# Patient Record
Sex: Female | Born: 1960 | Race: White | Hispanic: No | Marital: Married | Smoking: Former smoker
Health system: Southern US, Community
[De-identification: ages and names within clinical notes are randomized; demographics above are authoritative.]

## PROBLEM LIST (undated history)

## (undated) DIAGNOSIS — F329 Major depressive disorder, single episode, unspecified: Secondary | ICD-10-CM

## (undated) DIAGNOSIS — Z9889 Other specified postprocedural states: Secondary | ICD-10-CM

## (undated) DIAGNOSIS — F419 Anxiety disorder, unspecified: Secondary | ICD-10-CM

## (undated) DIAGNOSIS — K5792 Diverticulitis of intestine, part unspecified, without perforation or abscess without bleeding: Secondary | ICD-10-CM

## (undated) DIAGNOSIS — F32A Depression, unspecified: Secondary | ICD-10-CM

## (undated) DIAGNOSIS — G4730 Sleep apnea, unspecified: Secondary | ICD-10-CM

## (undated) DIAGNOSIS — J45909 Unspecified asthma, uncomplicated: Secondary | ICD-10-CM

## (undated) DIAGNOSIS — G43909 Migraine, unspecified, not intractable, without status migrainosus: Secondary | ICD-10-CM

## (undated) DIAGNOSIS — K769 Liver disease, unspecified: Secondary | ICD-10-CM

## (undated) DIAGNOSIS — R002 Palpitations: Secondary | ICD-10-CM

## (undated) DIAGNOSIS — T4145XA Adverse effect of unspecified anesthetic, initial encounter: Secondary | ICD-10-CM

## (undated) DIAGNOSIS — M1990 Unspecified osteoarthritis, unspecified site: Secondary | ICD-10-CM

## (undated) DIAGNOSIS — R112 Nausea with vomiting, unspecified: Secondary | ICD-10-CM

## (undated) DIAGNOSIS — T8859XA Other complications of anesthesia, initial encounter: Secondary | ICD-10-CM

## (undated) HISTORY — DX: Liver disease, unspecified: K76.9

## (undated) HISTORY — PX: TONSILLECTOMY: SUR1361

## (undated) HISTORY — DX: Depression, unspecified: F32.A

## (undated) HISTORY — PX: CHOLECYSTECTOMY: SHX55

## (undated) HISTORY — PX: ABDOMINAL HYSTERECTOMY: SHX81

## (undated) HISTORY — PX: SINUSOTOMY: SHX291

## (undated) HISTORY — DX: Diverticulitis of intestine, part unspecified, without perforation or abscess without bleeding: K57.92

## (undated) HISTORY — DX: Major depressive disorder, single episode, unspecified: F32.9

## (undated) HISTORY — DX: Anxiety disorder, unspecified: F41.9

## (undated) HISTORY — PX: APPENDECTOMY: SHX54

## (undated) HISTORY — DX: Unspecified asthma, uncomplicated: J45.909

---
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
## 2004-01-21 ENCOUNTER — Ambulatory Visit: Payer: Self-pay | Admitting: Unknown Physician Specialty

## 2004-05-27 ENCOUNTER — Inpatient Hospital Stay: Payer: Self-pay | Admitting: Surgery

## 2005-09-05 ENCOUNTER — Ambulatory Visit: Payer: Self-pay | Admitting: Internal Medicine

## 2005-10-11 ENCOUNTER — Ambulatory Visit: Payer: Self-pay | Admitting: Internal Medicine

## 2005-10-12 ENCOUNTER — Ambulatory Visit: Payer: Self-pay | Admitting: Internal Medicine

## 2006-11-26 ENCOUNTER — Ambulatory Visit: Payer: Self-pay | Admitting: Unknown Physician Specialty

## 2007-12-20 ENCOUNTER — Ambulatory Visit: Payer: Self-pay | Admitting: Orthopedic Surgery

## 2008-02-06 ENCOUNTER — Ambulatory Visit: Payer: Self-pay | Admitting: Unknown Physician Specialty

## 2009-01-30 ENCOUNTER — Ambulatory Visit: Payer: Self-pay | Admitting: Rheumatology

## 2009-03-04 ENCOUNTER — Inpatient Hospital Stay: Payer: Self-pay | Admitting: Internal Medicine

## 2009-04-07 ENCOUNTER — Ambulatory Visit: Payer: Self-pay | Admitting: Internal Medicine

## 2009-04-26 ENCOUNTER — Ambulatory Visit: Payer: Self-pay | Admitting: Orthopedic Surgery

## 2009-04-29 ENCOUNTER — Ambulatory Visit: Payer: Self-pay | Admitting: Orthopedic Surgery

## 2011-04-12 DIAGNOSIS — J45909 Unspecified asthma, uncomplicated: Secondary | ICD-10-CM | POA: Insufficient documentation

## 2011-04-12 DIAGNOSIS — G4730 Sleep apnea, unspecified: Secondary | ICD-10-CM | POA: Insufficient documentation

## 2011-04-12 DIAGNOSIS — R002 Palpitations: Secondary | ICD-10-CM | POA: Insufficient documentation

## 2011-04-12 DIAGNOSIS — R079 Chest pain, unspecified: Secondary | ICD-10-CM | POA: Insufficient documentation

## 2011-06-05 ENCOUNTER — Ambulatory Visit: Payer: Self-pay | Admitting: Internal Medicine

## 2011-12-08 ENCOUNTER — Ambulatory Visit: Payer: Self-pay | Admitting: Internal Medicine

## 2012-02-21 ENCOUNTER — Other Ambulatory Visit: Payer: Self-pay | Admitting: Internal Medicine

## 2012-02-27 LAB — CULTURE, BLOOD (SINGLE)

## 2012-04-23 ENCOUNTER — Ambulatory Visit: Payer: Self-pay

## 2012-08-05 ENCOUNTER — Inpatient Hospital Stay: Payer: Self-pay | Admitting: Internal Medicine

## 2012-08-05 LAB — URINALYSIS, COMPLETE
Bilirubin,UR: NEGATIVE
Blood: NEGATIVE
Glucose,UR: NEGATIVE mg/dL (ref 0–75)
Leukocyte Esterase: NEGATIVE
Ph: 7 (ref 4.5–8.0)
Protein: NEGATIVE
Specific Gravity: 1.015 (ref 1.003–1.030)
Squamous Epithelial: <1
WBC UR: 1 /HPF (ref 0–5)

## 2012-08-05 LAB — CBC WITH DIFFERENTIAL/PLATELET
Eosinophil #: 0.3 10*3/uL (ref 0.0–0.7)
Eosinophil %: 2 %
HCT: 43.5 % (ref 35.0–47.0)
Lymphocyte %: 18.4 %
MCH: 28.9 pg (ref 26.0–34.0)
MCHC: 32.9 g/dL (ref 32.0–36.0)
MCV: 88 fL (ref 80–100)
Monocyte %: 7.5 %
Neutrophil %: 71.5 %
RBC: 4.95 10*6/uL (ref 3.80–5.20)
RDW: 14.6 % — ABNORMAL HIGH (ref 11.5–14.5)
WBC: 13.6 10*3/uL — ABNORMAL HIGH (ref 3.6–11.0)

## 2012-08-05 LAB — COMPREHENSIVE METABOLIC PANEL
Albumin: 3.5 g/dL (ref 3.4–5.0)
Alkaline Phosphatase: 148 U/L — ABNORMAL HIGH (ref 50–136)
Anion Gap: 3 — ABNORMAL LOW (ref 7–16)
Bilirubin,Total: 0.4 mg/dL (ref 0.2–1.0)
Calcium, Total: 8.7 mg/dL (ref 8.5–10.1)
Chloride: 106 mmol/L (ref 98–107)
Creatinine: 1.09 mg/dL (ref 0.60–1.30)
EGFR (African American): >60
EGFR (Non-African Amer.): 58 — ABNORMAL LOW
Osmolality: 271 (ref 275–301)
Potassium: 4.1 mmol/L (ref 3.5–5.1)
Total Protein: 7.5 g/dL (ref 6.4–8.2)

## 2012-08-06 LAB — BASIC METABOLIC PANEL
Anion Gap: 3 — ABNORMAL LOW (ref 7–16)
BUN: 8 mg/dL (ref 7–18)
Co2: 27 mmol/L (ref 21–32)
Osmolality: 273 (ref 275–301)
Potassium: 3.6 mmol/L (ref 3.5–5.1)
Sodium: 137 mmol/L (ref 136–145)

## 2012-08-06 LAB — CBC WITH DIFFERENTIAL/PLATELET
Basophil #: 0.1 10*3/uL (ref 0.0–0.1)
Basophil %: 0.6 %
Eosinophil %: 2.7 %
HGB: 13.2 g/dL (ref 12.0–16.0)
MCH: 29.4 pg (ref 26.0–34.0)
MCV: 87 fL (ref 80–100)
Monocyte %: 9.3 %
Neutrophil #: 6.9 10*3/uL — ABNORMAL HIGH (ref 1.4–6.5)
Neutrophil %: 63.9 %
RBC: 4.48 10*6/uL (ref 3.80–5.20)
WBC: 10.8 10*3/uL (ref 3.6–11.0)

## 2012-08-07 LAB — CBC WITH DIFFERENTIAL/PLATELET
Basophil #: 0.1 10*3/uL (ref 0.0–0.1)
Basophil %: 0.7 %
Eosinophil #: 0.4 10*3/uL (ref 0.0–0.7)
Eosinophil %: 5 %
HGB: 13 g/dL (ref 12.0–16.0)
MCH: 28.8 pg (ref 26.0–34.0)
MCHC: 33.1 g/dL (ref 32.0–36.0)
MCV: 87 fL (ref 80–100)
Monocyte #: 0.6 x10 3/mm (ref 0.2–0.9)
Monocyte %: 8.4 %
Neutrophil %: 60.3 %
RBC: 4.51 10*6/uL (ref 3.80–5.20)
RDW: 14 % (ref 11.5–14.5)
WBC: 7.5 10*3/uL (ref 3.6–11.0)

## 2012-08-07 LAB — BASIC METABOLIC PANEL
Anion Gap: 5 — ABNORMAL LOW (ref 7–16)
BUN: 7 mg/dL (ref 7–18)
Calcium, Total: 8.5 mg/dL (ref 8.5–10.1)
Co2: 26 mmol/L (ref 21–32)
Glucose: 123 mg/dL — ABNORMAL HIGH (ref 65–99)
Sodium: 139 mmol/L (ref 136–145)

## 2012-08-08 LAB — CBC WITH DIFFERENTIAL/PLATELET
Basophil #: 0 10*3/uL (ref 0.0–0.1)
Basophil %: 0.7 %
Eosinophil #: 0.4 10*3/uL (ref 0.0–0.7)
HCT: 38.1 % (ref 35.0–47.0)
HGB: 12.8 g/dL (ref 12.0–16.0)
Lymphocyte %: 30.6 %
Monocyte #: 0.6 x10 3/mm (ref 0.2–0.9)
Neutrophil %: 53.9 %
Platelet: 214 10*3/uL (ref 150–440)
RBC: 4.35 10*6/uL (ref 3.80–5.20)
RDW: 14.1 % (ref 11.5–14.5)
WBC: 7.1 10*3/uL (ref 3.6–11.0)

## 2013-03-18 LAB — CBC WITH DIFFERENTIAL/PLATELET
BASOS PCT: 0.3 %
Basophil #: 0 10*3/uL (ref 0.0–0.1)
EOS ABS: 0.1 10*3/uL (ref 0.0–0.7)
Eosinophil %: 1.2 %
HCT: 41.8 % (ref 35.0–47.0)
HGB: 13.9 g/dL (ref 12.0–16.0)
LYMPHS ABS: 0.5 10*3/uL — AB (ref 1.0–3.6)
Lymphocyte %: 5.8 %
MCH: 29.5 pg (ref 26.0–34.0)
MCHC: 33.3 g/dL (ref 32.0–36.0)
MCV: 89 fL (ref 80–100)
Monocyte #: 0.4 x10 3/mm (ref 0.2–0.9)
Monocyte %: 4.2 %
Neutrophil #: 7.4 10*3/uL — ABNORMAL HIGH (ref 1.4–6.5)
Neutrophil %: 88.5 %
Platelet: 185 10*3/uL (ref 150–440)
RBC: 4.71 10*6/uL (ref 3.80–5.20)
RDW: 14.1 % (ref 11.5–14.5)
WBC: 8.4 10*3/uL (ref 3.6–11.0)

## 2013-03-18 LAB — COMPREHENSIVE METABOLIC PANEL
Albumin: 3.8 g/dL (ref 3.4–5.0)
Alkaline Phosphatase: 127 U/L — ABNORMAL HIGH
Anion Gap: 5 — ABNORMAL LOW (ref 7–16)
BUN: 7 mg/dL (ref 7–18)
Bilirubin,Total: 0.5 mg/dL (ref 0.2–1.0)
CHLORIDE: 106 mmol/L (ref 98–107)
Calcium, Total: 8.3 mg/dL — ABNORMAL LOW (ref 8.5–10.1)
Co2: 27 mmol/L (ref 21–32)
Creatinine: 1.26 mg/dL (ref 0.60–1.30)
EGFR (African American): 56 — ABNORMAL LOW
GFR CALC NON AF AMER: 49 — AB
Glucose: 119 mg/dL — ABNORMAL HIGH (ref 65–99)
Osmolality: 275 (ref 275–301)
Potassium: 3.9 mmol/L (ref 3.5–5.1)
SGOT(AST): 23 U/L (ref 15–37)
SGPT (ALT): 24 U/L (ref 12–78)
SODIUM: 138 mmol/L (ref 136–145)
TOTAL PROTEIN: 7.7 g/dL (ref 6.4–8.2)

## 2013-03-18 LAB — URINALYSIS, COMPLETE
Bacteria: NONE SEEN
Bilirubin,UR: NEGATIVE
Blood: NEGATIVE
Glucose,UR: NEGATIVE mg/dL (ref 0–75)
Ketone: NEGATIVE
Nitrite: NEGATIVE
PROTEIN: NEGATIVE
Ph: 7 (ref 4.5–8.0)
RBC,UR: 4 /HPF (ref 0–5)
Specific Gravity: 1.017 (ref 1.003–1.030)
Squamous Epithelial: 3

## 2013-03-18 LAB — CK TOTAL AND CKMB (NOT AT ARMC)
CK, TOTAL: 137 U/L
CK, Total: 115 U/L
CK, Total: 115 U/L
CK-MB: 0.5 ng/mL (ref 0.5–3.6)
CK-MB: 0.8 ng/mL (ref 0.5–3.6)
CK-MB: 0.8 ng/mL (ref 0.5–3.6)

## 2013-03-18 LAB — TROPONIN I: Troponin-I: <0.02 ng/mL

## 2013-03-19 LAB — CBC WITH DIFFERENTIAL/PLATELET
BASOS PCT: 0.2 %
Basophil #: 0 10*3/uL (ref 0.0–0.1)
Eosinophil #: 0 10*3/uL (ref 0.0–0.7)
Eosinophil %: 0.1 %
HCT: 38.2 % (ref 35.0–47.0)
HGB: 12.8 g/dL (ref 12.0–16.0)
LYMPHS PCT: 9.7 %
Lymphocyte #: 0.7 10*3/uL — ABNORMAL LOW (ref 1.0–3.6)
MCH: 29.7 pg (ref 26.0–34.0)
MCHC: 33.6 g/dL (ref 32.0–36.0)
MCV: 89 fL (ref 80–100)
MONO ABS: 0.3 x10 3/mm (ref 0.2–0.9)
Monocyte %: 4.6 %
Neutrophil #: 6.2 10*3/uL (ref 1.4–6.5)
Neutrophil %: 85.4 %
Platelet: 148 10*3/uL — ABNORMAL LOW (ref 150–440)
RBC: 4.31 10*6/uL (ref 3.80–5.20)
RDW: 14.2 % (ref 11.5–14.5)
WBC: 7.2 10*3/uL (ref 3.6–11.0)

## 2013-03-19 LAB — BASIC METABOLIC PANEL
Anion Gap: 7 (ref 7–16)
BUN: 13 mg/dL (ref 7–18)
CO2: 22 mmol/L (ref 21–32)
Calcium, Total: 7.7 mg/dL — ABNORMAL LOW (ref 8.5–10.1)
Chloride: 106 mmol/L (ref 98–107)
Creatinine: 1.42 mg/dL — ABNORMAL HIGH (ref 0.60–1.30)
EGFR (African American): 49 — ABNORMAL LOW
EGFR (Non-African Amer.): 42 — ABNORMAL LOW
Glucose: 116 mg/dL — ABNORMAL HIGH (ref 65–99)
Osmolality: 271 (ref 275–301)
Potassium: 3.5 mmol/L (ref 3.5–5.1)
Sodium: 135 mmol/L — ABNORMAL LOW (ref 136–145)

## 2013-03-20 ENCOUNTER — Inpatient Hospital Stay: Payer: Self-pay | Admitting: Internal Medicine

## 2013-03-20 LAB — BASIC METABOLIC PANEL
ANION GAP: 6 — AB (ref 7–16)
BUN: 14 mg/dL (ref 7–18)
CALCIUM: 7.5 mg/dL — AB (ref 8.5–10.1)
CHLORIDE: 107 mmol/L (ref 98–107)
CO2: 22 mmol/L (ref 21–32)
Creatinine: 1.29 mg/dL (ref 0.60–1.30)
EGFR (African American): 55 — ABNORMAL LOW
EGFR (Non-African Amer.): 47 — ABNORMAL LOW
GLUCOSE: 101 mg/dL — AB (ref 65–99)
Osmolality: 271 (ref 275–301)
Potassium: 3.5 mmol/L (ref 3.5–5.1)
Sodium: 135 mmol/L — ABNORMAL LOW (ref 136–145)

## 2013-03-20 LAB — CBC WITH DIFFERENTIAL/PLATELET
Basophil #: 0 10*3/uL (ref 0.0–0.1)
Basophil %: 0.1 %
Eosinophil #: 0 10*3/uL (ref 0.0–0.7)
Eosinophil %: 0.3 %
HCT: 38.3 % (ref 35.0–47.0)
HGB: 12.9 g/dL (ref 12.0–16.0)
Lymphocyte #: 0.5 10*3/uL — ABNORMAL LOW (ref 1.0–3.6)
Lymphocyte %: 8.6 %
MCH: 29.8 pg (ref 26.0–34.0)
MCHC: 33.7 g/dL (ref 32.0–36.0)
MCV: 88 fL (ref 80–100)
MONOS PCT: 2.6 %
Monocyte #: 0.2 x10 3/mm (ref 0.2–0.9)
NEUTROS PCT: 88.4 %
Neutrophil #: 5.5 10*3/uL (ref 1.4–6.5)
Platelet: 119 10*3/uL — ABNORMAL LOW (ref 150–440)
RBC: 4.34 10*6/uL (ref 3.80–5.20)
RDW: 14.1 % (ref 11.5–14.5)
WBC: 6.2 10*3/uL (ref 3.6–11.0)

## 2013-03-20 LAB — URINE CULTURE

## 2013-03-21 LAB — BASIC METABOLIC PANEL
ANION GAP: 6 — AB (ref 7–16)
BUN: 12 mg/dL (ref 7–18)
CALCIUM: 8.2 mg/dL — AB (ref 8.5–10.1)
CHLORIDE: 110 mmol/L — AB (ref 98–107)
CO2: 22 mmol/L (ref 21–32)
CREATININE: 1.07 mg/dL (ref 0.60–1.30)
EGFR (Non-African Amer.): 59 — ABNORMAL LOW
Glucose: 137 mg/dL — ABNORMAL HIGH (ref 65–99)
Osmolality: 278 (ref 275–301)
Potassium: 3.6 mmol/L (ref 3.5–5.1)
Sodium: 138 mmol/L (ref 136–145)

## 2013-03-21 LAB — CBC WITH DIFFERENTIAL/PLATELET
BASOS ABS: 0 10*3/uL (ref 0.0–0.1)
BASOS PCT: 0.4 %
EOS PCT: 0.1 %
Eosinophil #: 0 10*3/uL (ref 0.0–0.7)
HCT: 37.3 % (ref 35.0–47.0)
HGB: 12.8 g/dL (ref 12.0–16.0)
Lymphocyte #: 0.4 10*3/uL — ABNORMAL LOW (ref 1.0–3.6)
Lymphocyte %: 7.7 %
MCH: 29.9 pg (ref 26.0–34.0)
MCHC: 34.2 g/dL (ref 32.0–36.0)
MCV: 87 fL (ref 80–100)
Monocyte #: 0.1 x10 3/mm — ABNORMAL LOW (ref 0.2–0.9)
Monocyte %: 2.6 %
NEUTROS ABS: 5.1 10*3/uL (ref 1.4–6.5)
Neutrophil %: 89.2 %
Platelet: 123 10*3/uL — ABNORMAL LOW (ref 150–440)
RBC: 4.27 10*6/uL (ref 3.80–5.20)
RDW: 14.3 % (ref 11.5–14.5)
WBC: 5.7 10*3/uL (ref 3.6–11.0)

## 2013-03-23 LAB — CULTURE, BLOOD (SINGLE)

## 2013-03-23 LAB — EXPECTORATED SPUTUM ASSESSMENT W REFEX TO RESP CULTURE

## 2013-05-30 DIAGNOSIS — F419 Anxiety disorder, unspecified: Secondary | ICD-10-CM | POA: Insufficient documentation

## 2013-05-30 DIAGNOSIS — F32A Depression, unspecified: Secondary | ICD-10-CM | POA: Insufficient documentation

## 2013-05-30 DIAGNOSIS — E669 Obesity, unspecified: Secondary | ICD-10-CM | POA: Insufficient documentation

## 2013-05-30 DIAGNOSIS — F329 Major depressive disorder, single episode, unspecified: Secondary | ICD-10-CM | POA: Insufficient documentation

## 2013-10-13 ENCOUNTER — Ambulatory Visit: Payer: Self-pay | Admitting: Unknown Physician Specialty

## 2013-10-16 LAB — PATHOLOGY REPORT

## 2013-12-02 ENCOUNTER — Ambulatory Visit: Payer: Self-pay | Admitting: Internal Medicine

## 2013-12-05 ENCOUNTER — Ambulatory Visit: Payer: Self-pay | Admitting: Internal Medicine

## 2013-12-23 ENCOUNTER — Ambulatory Visit: Payer: Self-pay | Admitting: Internal Medicine

## 2014-01-07 ENCOUNTER — Inpatient Hospital Stay: Payer: Self-pay | Admitting: Family Medicine

## 2014-01-07 LAB — URINALYSIS, COMPLETE
Bacteria: NONE SEEN
Bilirubin,UR: NEGATIVE
Blood: NEGATIVE
Glucose,UR: NEGATIVE mg/dL (ref 0–75)
Ketone: NEGATIVE
Leukocyte Esterase: NEGATIVE
Nitrite: NEGATIVE
Ph: 5 (ref 4.5–8.0)
Protein: NEGATIVE
RBC,UR: 3 /HPF (ref 0–5)
Specific Gravity: 1.015 (ref 1.003–1.030)
WBC UR: 2 /HPF (ref 0–5)

## 2014-01-07 LAB — CBC WITH DIFFERENTIAL/PLATELET
BASOS PCT: 0.7 %
Basophil #: 0.1 10*3/uL (ref 0.0–0.1)
EOS PCT: 0.6 %
Eosinophil #: 0.1 10*3/uL (ref 0.0–0.7)
HCT: 46.5 % (ref 35.0–47.0)
HGB: 15.2 g/dL (ref 12.0–16.0)
LYMPHS PCT: 6.2 %
Lymphocyte #: 0.8 10*3/uL — ABNORMAL LOW (ref 1.0–3.6)
MCH: 29.4 pg (ref 26.0–34.0)
MCHC: 32.6 g/dL (ref 32.0–36.0)
MCV: 90 fL (ref 80–100)
MONO ABS: 0.4 x10 3/mm (ref 0.2–0.9)
Monocyte %: 2.7 %
NEUTROS ABS: 12.2 10*3/uL — AB (ref 1.4–6.5)
Neutrophil %: 89.8 %
Platelet: 266 10*3/uL (ref 150–440)
RBC: 5.17 10*6/uL (ref 3.80–5.20)
RDW: 14.2 % (ref 11.5–14.5)
WBC: 13.6 10*3/uL — AB (ref 3.6–11.0)

## 2014-01-07 LAB — COMPREHENSIVE METABOLIC PANEL
ALK PHOS: 141 U/L — AB
ALT: 32 U/L
Albumin: 3.9 g/dL (ref 3.4–5.0)
Anion Gap: 7 (ref 7–16)
BUN: 16 mg/dL (ref 7–18)
Bilirubin,Total: 0.6 mg/dL (ref 0.2–1.0)
CALCIUM: 8.5 mg/dL (ref 8.5–10.1)
Chloride: 112 mmol/L — ABNORMAL HIGH (ref 98–107)
Co2: 19 mmol/L — ABNORMAL LOW (ref 21–32)
Creatinine: 1.05 mg/dL (ref 0.60–1.30)
GFR CALC NON AF AMER: 58 — AB
Glucose: 122 mg/dL — ABNORMAL HIGH (ref 65–99)
OSMOLALITY: 278 (ref 275–301)
Potassium: 3.7 mmol/L (ref 3.5–5.1)
SGOT(AST): 24 U/L (ref 15–37)
SODIUM: 138 mmol/L (ref 136–145)
TOTAL PROTEIN: 7.9 g/dL (ref 6.4–8.2)

## 2014-01-08 LAB — BASIC METABOLIC PANEL
ANION GAP: 10 (ref 7–16)
BUN: 14 mg/dL (ref 7–18)
CALCIUM: 7.6 mg/dL — AB (ref 8.5–10.1)
CHLORIDE: 109 mmol/L — AB (ref 98–107)
Co2: 23 mmol/L (ref 21–32)
Creatinine: 1.05 mg/dL (ref 0.60–1.30)
GFR CALC NON AF AMER: 58 — AB
Glucose: 116 mg/dL — ABNORMAL HIGH (ref 65–99)
OSMOLALITY: 285 (ref 275–301)
POTASSIUM: 3.4 mmol/L — AB (ref 3.5–5.1)
Sodium: 142 mmol/L (ref 136–145)

## 2014-01-08 LAB — URINALYSIS, COMPLETE
BLOOD: NEGATIVE
Bilirubin,UR: NEGATIVE
GLUCOSE, UR: NEGATIVE mg/dL (ref 0–75)
Ketone: NEGATIVE
NITRITE: NEGATIVE
PROTEIN: NEGATIVE
Ph: 6 (ref 4.5–8.0)
RBC,UR: 1 /HPF (ref 0–5)
Specific Gravity: 1.014 (ref 1.003–1.030)
WBC UR: 4 /HPF (ref 0–5)

## 2014-01-08 LAB — CBC WITH DIFFERENTIAL/PLATELET
Basophil #: 0 10*3/uL (ref 0.0–0.1)
Basophil %: 0.4 %
EOS PCT: 1.8 %
Eosinophil #: 0.1 10*3/uL (ref 0.0–0.7)
HCT: 40.1 % (ref 35.0–47.0)
HGB: 12.9 g/dL (ref 12.0–16.0)
Lymphocyte #: 1 10*3/uL (ref 1.0–3.6)
Lymphocyte %: 14.1 %
MCH: 29.3 pg (ref 26.0–34.0)
MCHC: 32.1 g/dL (ref 32.0–36.0)
MCV: 92 fL (ref 80–100)
MONOS PCT: 6.9 %
Monocyte #: 0.5 x10 3/mm (ref 0.2–0.9)
Neutrophil #: 5.5 10*3/uL (ref 1.4–6.5)
Neutrophil %: 76.8 %
Platelet: 185 10*3/uL (ref 150–440)
RBC: 4.38 10*6/uL (ref 3.80–5.20)
RDW: 14.1 % (ref 11.5–14.5)
WBC: 7.2 10*3/uL (ref 3.6–11.0)

## 2014-01-09 LAB — BASIC METABOLIC PANEL
ANION GAP: 4 — AB (ref 7–16)
BUN: 9 mg/dL (ref 7–18)
CALCIUM: 7.7 mg/dL — AB (ref 8.5–10.1)
CO2: 28 mmol/L (ref 21–32)
Chloride: 109 mmol/L — ABNORMAL HIGH (ref 98–107)
Creatinine: 1.3 mg/dL (ref 0.60–1.30)
EGFR (African American): 55 — ABNORMAL LOW
EGFR (Non-African Amer.): 46 — ABNORMAL LOW
GLUCOSE: 99 mg/dL (ref 65–99)
OSMOLALITY: 280 (ref 275–301)
Potassium: 3.8 mmol/L (ref 3.5–5.1)
Sodium: 141 mmol/L (ref 136–145)

## 2014-01-09 LAB — CBC WITH DIFFERENTIAL/PLATELET
BASOS ABS: 0 10*3/uL (ref 0.0–0.1)
Basophil %: 0.5 %
Eosinophil #: 0.3 10*3/uL (ref 0.0–0.7)
Eosinophil %: 5.7 %
HCT: 38.2 % (ref 35.0–47.0)
HGB: 12.4 g/dL (ref 12.0–16.0)
LYMPHS PCT: 17.4 %
Lymphocyte #: 0.9 10*3/uL — ABNORMAL LOW (ref 1.0–3.6)
MCH: 29.6 pg (ref 26.0–34.0)
MCHC: 32.4 g/dL (ref 32.0–36.0)
MCV: 92 fL (ref 80–100)
Monocyte #: 0.7 x10 3/mm (ref 0.2–0.9)
Monocyte %: 12.3 %
NEUTROS PCT: 64.1 %
Neutrophil #: 3.4 10*3/uL (ref 1.4–6.5)
Platelet: 161 10*3/uL (ref 150–440)
RBC: 4.17 10*6/uL (ref 3.80–5.20)
RDW: 14.2 % (ref 11.5–14.5)
WBC: 5.3 10*3/uL (ref 3.6–11.0)

## 2014-01-09 LAB — CLOSTRIDIUM DIFFICILE(ARMC)

## 2014-01-10 LAB — BASIC METABOLIC PANEL
Anion Gap: 7 (ref 7–16)
BUN: 7 mg/dL (ref 7–18)
CREATININE: 1.04 mg/dL (ref 0.60–1.30)
Calcium, Total: 7.9 mg/dL — ABNORMAL LOW (ref 8.5–10.1)
Chloride: 115 mmol/L — ABNORMAL HIGH (ref 98–107)
Co2: 22 mmol/L (ref 21–32)
EGFR (Non-African Amer.): 59 — ABNORMAL LOW
Glucose: 103 mg/dL — ABNORMAL HIGH (ref 65–99)
Osmolality: 285 (ref 275–301)
POTASSIUM: 3.6 mmol/L (ref 3.5–5.1)
SODIUM: 144 mmol/L (ref 136–145)

## 2014-01-10 LAB — CBC WITH DIFFERENTIAL/PLATELET
BASOS ABS: 0 10*3/uL (ref 0.0–0.1)
BASOS PCT: 0.8 %
EOS ABS: 0.4 10*3/uL (ref 0.0–0.7)
EOS PCT: 7.1 %
HCT: 39.3 % (ref 35.0–47.0)
HGB: 12.8 g/dL (ref 12.0–16.0)
LYMPHS ABS: 1.5 10*3/uL (ref 1.0–3.6)
LYMPHS PCT: 28.9 %
MCH: 29.7 pg (ref 26.0–34.0)
MCHC: 32.6 g/dL (ref 32.0–36.0)
MCV: 91 fL (ref 80–100)
Monocyte #: 0.6 x10 3/mm (ref 0.2–0.9)
Monocyte %: 12.3 %
Neutrophil #: 2.6 10*3/uL (ref 1.4–6.5)
Neutrophil %: 50.9 %
Platelet: 170 10*3/uL (ref 150–440)
RBC: 4.32 10*6/uL (ref 3.80–5.20)
RDW: 14.2 % (ref 11.5–14.5)
WBC: 5 10*3/uL (ref 3.6–11.0)

## 2014-01-10 LAB — URINE CULTURE

## 2014-01-10 LAB — WBCS, STOOL

## 2014-02-20 DIAGNOSIS — S83209A Unspecified tear of unspecified meniscus, current injury, unspecified knee, initial encounter: Secondary | ICD-10-CM | POA: Insufficient documentation

## 2014-02-20 DIAGNOSIS — M1710 Unilateral primary osteoarthritis, unspecified knee: Secondary | ICD-10-CM | POA: Insufficient documentation

## 2014-02-20 DIAGNOSIS — M179 Osteoarthritis of knee, unspecified: Secondary | ICD-10-CM | POA: Insufficient documentation

## 2014-04-01 ENCOUNTER — Ambulatory Visit: Payer: Self-pay | Admitting: General Practice

## 2014-04-22 ENCOUNTER — Ambulatory Visit
Admit: 2014-04-22 | Disposition: A | Discharge status: Home / Self Care | Payer: Self-pay | Attending: Internal Medicine | Admitting: Internal Medicine

## 2014-05-08 NOTE — H&P (Signed)
PATIENT NAME:  [name], [name] MR#:  [id number] DATE OF BIRTH:  08/03/1960  DATE OF ADMISSION:  08/05/2012  CHIEF COMPLAINT: Lower abdominal pain.   HISTORY OF PRESENT ILLNESS: [name] is a 54 year old female with a history of asthma, migraines and depression, who presented to the clinic complaining of pain in the lower abdomen radiating to the back. The patient states that her symptoms started approximately 3 days back and got worse for the last couple of days. She describes the pain as 9 out of 10, worse with movement, worse with walking and sometimes doubles her over. She has also been running a low-grade fever and has been complaining of diarrhea along with some nausea. Denies any use of tobacco or alcohol. Denies any chest pain. No vomiting.   PAST MEDICAL HISTORY: Significant for: 1. Allergic rhinitis.  2. Asthma.  3. Peptic ulcer disease.  4. Gastroesophageal reflux disease.  5. Endometriosis.  6. Anxiety, depression.  7. Sleep apnea.  8. Diverticulosis.  9. History of migraines.  10 History of osteoarthritis with plantar fasciitis.   PAST SURGICAL HISTORY: Significant for: 1. Abdominal hysterectomy with bilateral salpingo-oophorectomy.  2. History of knee surgery.  3. History of cholecystectomy in March 2002.  4. History of arthroscopic left knee surgery and partial medial meniscectomy.   FAMILY HISTORY: Mother has CHF and type 2 diabetes. Dad had skin cancer.   REVIEW OF SYSTEMS: The patient denies any headaches. No vomiting. No chest pain. No shortness of breath. She has been complaining of some dysuria associated with lower abdominal pain, nausea and diarrhea.   CURRENT MEDICATIONS: Include: 1. Advair Diskus 250/50 one puff b.i.d.  2. Cymbalta 160 mg once a day.  3. Singulair 10 mg a day.  4. Diazepam 2.5 mg at bedtime.  5. Zyrtec 10 mg at bedtime.  6. BuSpar 15 mg b.i.d.  7. Abilify 2 mg at night. 8. Topamax 200 mg at night. 9. One baby aspirin. 10. ProAir HFA 2  puffs q.i.d. p.r.n.   ALLERGIES: SHE IS ALLERGIC TO AUGMENTIN, CEFTIN, CORTISONE, DARVOCET, HYDROCODONE, MYCINS, PENICILLIN, RED DYE, SHRIMP, SULFA, TRIAMCINOLONE AND SINGULAIR ESPECIALLY WITH RED DYE.   PHYSICAL EXAMINATION:  GENERAL: She was in obvious pain. She was anxious, obese.  VITAL SIGNS: Weight was 285 pounds, blood pressure 124/82, pulse was 96.  HEENT: [place], AT.  HEART: S1, S2.  LUNGS: Clear to auscultation.  ABDOMEN: Obese, soft. Evidence of tenderness detected in both lower quadrant areas with guarding. No rebound.  EXTREMITIES: Trace edema.  NEUROLOGIC: Alert and oriented x3. No obvious focal signs.   LABORATORIES DONE TODAY: Showed WBC count 13.6, hemoglobin 14.2, hematocrit 43.5, platelets 225. Total protein 7.5, alkaline phosphatase 148, glucose 96, BUN 11, creatinine 1.09, sodium 136, potassium 4.1, chloride 106, CO2 27, EGFR was greater than 60.   IMPRESSION:  1. Bilateral lower quadrant abdominal pain, unclear etiology. Differential diagnosis includes diverticulitis versus pyelonephritis. Will admit the patient to [facility] for pain management. Will also obtain CT scan of the abdomen and pelvis. SINCE THE PATIENT HAS AN IODINE ALLERGY, will do it without IV contrast but with oral contrast.  2. Asthma. Continue ProAir HFA.  3. Migraine headaches. Continue Topamax.  4. Depression and anxiety. Continue BuSpar, diazepam and Abilify. Will hold on Cymbalta at this point in time.  5. Gastroesophageal reflux disease. Continue Protonix.  6. For abdominal pain, symptomatic relief with Demerol 25 mg IV q.6 p.r.n.   Discussed in detail plan of action with the patient, who was in  agreement.   ____________________________ Tracie Harrier, MD vh:OSi D: 08/05/2012 17:41:19 ET T: 08/06/2012 07:23:33 ET JOB#: [id number]  cc: Tracie Harrier, MD, <Dictator> Tracie Harrier MD ELECTRONICALLY SIGNED 08/08/2012 19:05

## 2014-05-08 NOTE — Discharge Summary (Signed)
PATIENT NAME:  [name], [name] MR#:  [id number] DATE OF BIRTH:  May 15, 1960  DATE OF ADMISSION:  08/05/2012 DATE OF DISCHARGE:  08/09/2012  DISCHARGE DIAGNOSES:  1.  Acute sigmoid diverticulitis.  2.  History of asthma.  3.  Migraine headaches.  4.  Depression and anxiety.  5.  Gastroesophageal reflux disease.   CHIEF COMPLAINT: Lower abdominal pain.   HISTORY OF PRESENT ILLNESS: [name] is a 54 year old female with a history of migraines, depression and asthma, who presented to the [facility] complaining of pain in the lower abdomen radiating to the back. The patient states that her symptoms started about 3 days prior to admission and progressively have gotten worse. She had also running a low-grade fever and had been complaining of diarrhea associated with nausea. The patient denies any recent use of antibiotics. Denies any chest pain. No vomiting.   PAST MEDICAL HISTORY: Significant for allergy colitis, asthma, peptic ulcer disease, gastroesophageal reflux disease, endometriosis, anxiety, depression, sleep apnea, diverticulosis, migraine headaches, osteoarthritis and plantar fasciitis.   PAST SURGICAL HISTORY: Significant for abdominal hysterectomy, history of knee surgery, history of cholecystectomy, arthroscopic left knee surgery and partial medial meniscectomy.   PHYSICAL EXAMINATION:  GENERAL: She was in obvious pain, anxious, obese.  VITAL SIGNS: Weight 285 pounds, blood pressure 124/82, pulse was 96.  HEENT: NCAT.  HEART: S1, S2.  LUNGS: Clear.  ABDOMEN: Soft. Evidence of tenderness noted initially in both lower quadrants. Subsequently localized left lower quadrant area with guarding. No rebound.  EXTREMITIES: Trace edema.  NEUROLOGIC: Nonfocal.   LABORATORY, DIAGNOSTIC AND RADIOLOGICAL DATA: WBC count 13.6, hemoglobin 14.2, hematocrit 43.5, platelets 125. Total protein 7.5, alkaline phosphatase 148, glucose 96, BUN 11, creatinine 1.09, sodium 136, potassium 4.1,  chloride 106, CO2 27.   HOSPITAL COURSE: The patient was admitted to [facility] and started on intravenous Cipro and Flagyl. She also underwent a CT scan of the abdomen and pelvis which was read as findings likely representing acute sigmoid diverticulitis. There was no extraluminal fluid collection. The patient responded well to the antibiotics and her white cell count came down to normal levels. Symptomatically she was also better. She was started on clear liquid diet, and this was gradually advanced to soft diet and patient was stable at the time of discharge.   DISCHARGE MEDICATIONS: She was discharged on the following medications: Cipro 5 mg p.o. b.i.d. for 7 days, metronidazole 500 mg p.o. t.i.d. for 7 days, pantoprazole 40 mg once a day, BuSpar 15 mg b.i.d., Pro-Air HFA 2 puffs 4 times a day as needed, Singulair 10 mg at bedtime, Valium 5 mg at bedtime, Nasonex nasal spray 2 sprays each nostril once a day, multivitamin 1 tablet a day, Zyrtec 10 mg a day, Topamax 200 mg once a day and Protonix 40 mg once a day. The patient was also given Diflucan 150 mg x 1 with 1 refill and advised to follow up in the clinic in 1 to 2 weeks' time.   CONDITION ON DISCHARGE: The patient is stable at the time of discharge.   TOTAL TIME SPENT IN DISCHARGING THIS PATIENT: 35 minutes.  ____________________________ Tracie Harrier, MD vh:aw D: 08/15/2012 13:12:20 ET T: 08/15/2012 13:27:01 ET JOB#: [id number]  cc: Tracie Harrier, MD, <Dictator> Tracie Harrier MD ELECTRONICALLY SIGNED 08/19/2012 17:29

## 2014-05-09 NOTE — H&P (Signed)
PATIENT NAME:  [name], [name] MR#:  [id number] DATE OF BIRTH:  August 26, 1960  DATE OF ADMISSION:  03/18/2013  CHIEF COMPLAINT:  Fever, chills, myalgia, diarrhea, weakness, dizziness, lightheadedness, vomiting, chest pain.   HISTORY OF PRESENT ILLNESS: [name] is a 54 year old female with a history of asthma, gastroesophageal reflux disease, depression, sleep apnea, GERD, presents to the [facility] complaining of feeling weak associated with dizziness and lightheadedness. She also has been running a fever and has been having chills and states that her chest hurts. She started vomiting in the clinic today and states that she feels sleepy and has difficulty in standing.   PAST MEDICAL HISTORY: Significant for asthma, migraine headaches, depression, sleep apnea, allergic rhinitis, history of endometriosis, diverticulosis, osteoarthritis, spinal fasciitis, lumbar disk disease.   PAST SURGICAL HISTORY: Significant for abdominal hysterectomy, bilateral salpingo-oophorectomy in 2000, knee surgery, cholecystectomy in March 2002, arthroscopic left knee surgery with partial medial meniscectomy.  FAMILY HISTORY: Mother had CHF and type 2 diabetes. Dad had skin cancer.   CURRENT MEDICATIONS: Topamax 200 mg at bedtime, Advair Diskus 250/50 one puff b.i.d., diazepam 5 mg at bedtime, Zyrtec 10 mg at bedtime, oxygen 2 liters along with CPAP at night, BuSpar 15 mg once a day, Nasonex nasal spray 50 mcg b.i.d., Singulair 10 mg once a day, Align 1 capsule once a day, ProAir HFA 2 puffs q.i.d. p.[name].n., furosemide 20 mg once a day. escitalopram 20 mg once a day.   ALLERGIES: AMINOGLYCOSIDES, MONTELUKAST, AUGMENTIN, CEFTIN, CORTISONE, DARVOCET, HYDROCODONE, -MYCINS, PENICILLIN,  RED DYE, SHRIMP, SULFA, TRIAMCINOLONE ACETATE AND SINGULAIR.   PHYSICAL EXAMINATION: GENERAL: She appeared to be in moderate distress retching and vomiting. VITAL SIGNS:  Weight was 286, blood pressure 154/80, temperature of 101.9. Pulse  was 104, pulse oximetry 97% on room air, obese, in wheelchair.  HEENT: NCAT. Mucous membranes were dry.  NECK: No JVD.  HEART: S1, S2.  LUNGS: Rhonchi present bilaterally.  ABDOMEN: Soft. Tenderness noted in the right lower quadrant area. Mild guarding. No rebound.  EXTREMITIES: Trace edema.  NEUROLOGIC: Nonfocal.   IMPRESSION: Fever associated with diarrhea vomiting, weakness. Will check rapid flu test. We will also admit the patient to [facility] for IV fluids, Tylenol for fever and Zofran for vomiting. We will cycle cardiac enzymes, continue her asthma medications including albuterol and as needed nebulizers. Continue Lexapro and proton pump inhibitor and also continue CPAP with oxygen. Will obtain chest. We will also obtain blood cultures and obtain CBC, metabolic panel, urinalysis and rapid flu test. I am going to hold off on antibiotics until we see an obvious focus of infection, but at this point I am not able to send the patient home because of weakness and difficulty standing up. Will also monitor the patient on telemetry because of chest pain. The patient verbalized understanding and is agreeable to current plan.    ____________________________ Tracie Harrier, MD vh:ce D: 03/18/2013 12:08:24 ET T: 03/18/2013 12:37:42 ET JOB#: [id number]  cc: Tracie Harrier, MD, <Dictator> Tracie Harrier MD ELECTRONICALLY SIGNED 04/23/2013 13:40

## 2014-05-09 NOTE — H&P (Signed)
PATIENT NAME:  [name], [name] MR#:  [id number] DATE OF BIRTH:  24-Feb-1960  DATE OF ADMISSION:  01/07/2014  PRIMARY CARE PHYSICIAN: Tracie Harrier, MD    CHIEF COMPLAINT: Nausea, vomiting and diarrhea.   HISTORY OF PRESENT ILLNESS: [name] is a 54 year old female with a history of asthma, gastroesophageal reflux disease, depression, obesity who comes to the Emergency Department with the complaints of nausea, vomiting and diarrhea started since 1 a.m. The patient states had multiple episodes of vomiting, has been having multiple episodes of diarrhea, mucousy, associated with abdominal pain in the lower part of the abdomen. The patient states experiences some subjective fevers. Concerning this, multiple episodes of nausea, vomiting and diarrhea, came to the Emergency Department. CT abdomen and pelvis is consistent with enteritis and possible colitis. The patient was given Cipro and Flagyl in the Emergency Department by mouth, which the patient vomited. Concerning this, the decision is made to admit the patient for further treatment. The patient is also found to have elevated white blood cell count of 13,000 with a left shift. The patient denies having any sick contacts. Ate some hot dog leftover, however, the patient's husband also ate the similar thing however who did not fall sick.   PAST MEDICAL HISTORY:  1.  Asthma.  2.  Migraine headaches.  3.  Depression.  4.  Sleep apnea.  5.  Allergic rhinitis.  6.  Endometriosis.  7.  Diverticulosis.  8.  Osteoarthritic.   PAST SURGICAL HISTORY:  1.  Left knee surgery.  2.  Appendectomy.  3.  Sinus surgery.   4.  Cholecystectomy.  5.  Hysterectomy.   ALLERGIES:  1.  CEFTIN.  2.  PENICILLIN.  3.  IODINE.   4.  .   5.  SULFA.   6.  LATEX.   7.  SHELLFISH.   8.  DARVOCET.   9.  SEAFOOD.     HOME MEDICATIONS:  1.  Zofran 4 mg every 8 hours as needed.   2.  Valium 2 mg, 1-2 tablets 4 times a day as needed.  3.  Topamax 200 mg once a day  at bedtime.  4.  ProAir 2 puffs 4 times a day.  5.  .  6.  Fluticasone 1 puff 2 times a day.  7.  Celexa 20 mg once a day.  8.  Demerol 50 mg every 4 hours as needed.  9.  BuSpar  mg 2 times a day.  10.  Advair Diskus 1 puff 2 times a day.   SOCIAL HISTORY: Former smoker, quit 30 years back. Denies drinking alcohol or using illicit drugs. Married. Lives with her husband.   FAMILY HISTORY: Mother had congestive heart failure and diabetes mellitus. Father with skin cancer.   REVIEW OF SYSTEMS:    CONSTITUTIONAL: Experiencing generalized weakness.  EYES: No change in vision.  EARS, NOSE, AND THROAT: No change in hearing.  RESPIRATORY: No cough, shortness of breath.  CARDIOVASCULAR: No chest pain, palpitations.  GASTROINTESTINAL: Has nausea, vomiting, and diarrhea.  GENITOURINARY: No dysuria or hematuria.  HEMATOLOGIC: No easy bruising or bleeding.  SKIN: No rash or lesions.  MUSCULOSKELETAL: No joint pains and aches.  NEUROLOGIC: No weakness or numbness in any part of the body.   PHYSICAL EXAMINATION:  GENERAL: This is a well-built, well-nourished, age-appropriate female laying down in the bed, not in distress.  VITAL SIGNS: Temperature 97.4, pulse 69, blood pressure 111/62, respiratory rate of 18, oxygen saturation is 92% on room air.  HEENT:  Head normocephalic, atraumatic. There is no scleral icterus. Conjunctivae normal. Pupils equal and react to light. Mucous membranes moist. No pharyngeal erythema.  NECK: Supple. No lymphadenopathy. No JVD. No carotid bruit. No thyromegaly.   CHEST: No focal tenderness.  LUNGS: Bilaterally clear to auscultation.  HEART: S1, S2 regular. No murmurs are heard.  ABDOMEN: Bowel sounds plus. Soft. Mild tenderness in the epigastric area. No hepatosplenomegaly.  EXTREMITIES: No pedal edema. Pulses 2+.  SKIN:  No rash or lesions.   MUSCULOSKELETAL:  Good range of motion in all the extremities.   NEUROLOGIC: The patient is alert, oriented to place,  person, and time. Cranial nerves II to XII intact. Motor 5/5 in the upper and lower extremities.   LABORATORY DATA: CT abdomen and pelvis shows enteritis, colonic diverticulosis without acute diverticulitis.   Chest x-ray, one view, portable: No acute cardiopulmonary disease.   Ultrasound of the abdomen shows prior cholecystectomy, hepatic steatosis.   CBC: WBC of 13.6, hemoglobin 15, platelet count of 266, neutrophils of 90%.   UA negative for nitrites and leukocyte esterase.   BMP is completely within normal limits except for a bicarbonate of 19.   ASSESSMENT AND PLAN: [name] is a 54 year old female who comes with gastroenteritis.  1.  Gastroenteritis.  Keep the patient on n.p.o. except medications and ice chips. Continue with anti-nausea medications. Continue with IV  fluids. Check the C. difficile toxin. The patient denies any recent antibiotic use or recent hospital admissions. Continue patient with Cipro and Flagyl.   2.  Depression. Continue with the Celexa, Topamax.  3.  Obesity. Counseled with the patient.  4.  Keep the patient on deep vein thrombosis prophylaxis with Lovenox.   TIME SPENT: 50 minutes.    ____________________________ Monica Becton, MD pv:AT D: 01/07/2014 21:28:27 ET T: 01/07/2014 23:30:15 ET JOB#: [id number]  cc: Monica Becton, MD, <Dictator> Tracie Harrier, MD Monica Becton MD ELECTRONICALLY SIGNED 01/12/2014 22:11

## 2014-05-09 NOTE — Discharge Summary (Signed)
PATIENT NAME:  [name], [name] MR#:  [id number] DATE OF BIRTH:  [birth year]  DATE OF ADMISSION:  03/20/2013  DATE OF DISCHARGE:  03/22/2013  DISCHARGE DIAGNOSES: 1.  Pneumonia. 2.  Asthma exacerbation.  3.  Morbid obesity.  4.  Sleep apnea. 5.  Hyperglycemia from steroids.   DISCHARGE MEDICATIONS: Please see [place] discharge med reconciliation system for details. Briefly, she will be on her usual medications, plus prednisone taper and Levaquin for 3 days. Prednisone will be 60 mg for three, then 40 mg for three, then 20 mg for three.   HISTORY AND PHYSICAL: Please see detailed history and physical done on admission.   HOSPITAL COURSE: The patient was admitted by Dr. Ginette Pitman with nausea, vomiting, diarrhea as well as coughing, tight. She had a chest x-ray that is difficult to interpret, given her morbid obesity. Possible right lower lobe airspace disease. She improved on Levaquin and IV steroids. She is breathing well today, with mild wheezing only. She wishes to go home. Dr. Ginette Pitman saw her up until today. Labs are stable as of yesterday, with a glucose of 137 and CBC fairly normal otherwise. Sputum culture was unremarkable. Chest x-ray as described. Nausea, vomiting, diarrhea resolved. She has some right lower quadrant pain only when she coughs. Again, eating well, without difficulty, without vomiting, etc. No signs of any intraabdominal pathology, therefore. She was a little tender. She was, again, wheezing mildly today, in no distress. Had been weaned off of oxygen. She will use her CPAP when she gets home, and she understands to call with any further difficulty.   TIME SPENT: Approximately 35 minutes on all dc tasks today   ____________________________ Ocie Cornfield. Ouida Sills, MD mwa:mr D: 03/22/2013 09:29:00 ET T: 03/22/2013 18:20:23 ET JOB#: [id number]  cc: Ocie Cornfield. Ouida Sills, MD, <Dictator> Kirk Ruths MD ELECTRONICALLY SIGNED 03/23/2013 10:16

## 2014-05-13 NOTE — Discharge Summary (Signed)
PATIENT NAME:  [name], [name] MR#:  [id number] DATE OF BIRTH:  January 02, 1961  DATE OF ADMISSION:  01/07/2014 DATE OF DISCHARGE:  01/10/2014  DISCHARGE DIAGNOSES:  Gastroenteritis.   DISCHARGE MEDICATIONS:  1.  Topamax 200 mg p.o. at bedtime.  2.  ProAir 2 puffs every 4 hours as needed for wheezing.  3.  BuSpar 15 mg p.o. b.i.d.  4.  Escitalopram 20 mg p.o. daily.  5.  Advair 500/50, 1 puff b.i.d.  6.  Valium 2 mg 1-2 tablets q.i.d. as needed for muscle spasms.  7.  Metronidazole 500 mg p.o. t.i.d. x 4 more days.  8.  Acetaminophen 500 mg 2 tablets p.o. t.i.d. p.r.n. for severe pain.  9.  Ondansetron 4 mg p.o. t.i.d. p.r.n. for nausea or vomiting.  10.  Ciprofloxacin 500 mg p.o. b.i.d. x 4 more days.   CONSULTATIONS: None.   PROCEDURES: None.   PERTINENT LABORATORY AND STUDIES: CT of the abdomen showed signs of enteritis. It did show diverticulosis without acute diverticulitis; otherwise, no other acute findings. On the day of discharge, sodium 144, potassium 3.6, creatinine 1.04, glucose 103. White blood cell count 5, hemoglobin 12.8, platelets 170,000. C. difficile was negative. Stool WBCs are still pending.   BRIEF HOSPITAL COURSE: Acute gastroenteritis: The patient initially came in with acute nausea, vomiting, and diarrhea x 1 week or less. Clostridium difficile was negative. Stool WBCs are still pending at this time. Her symptoms did start to improve. No signs of dehydration. She was started Zofran for nausea and vomiting. She was also placed on Cipro and Flagyl because of a history of diverticulosis, but CT did not show any acute diverticulitis. She was responding to her regimen, therefore, will continue for completion of 7 days.   DISPOSITION: She is in stable condition. Discharge to home on supportive care. Advance diet as tolerated. Follow up with Dr. Ginette Pitman within 10 days.    ____________________________ Dion Body, MD kl:MT D: 01/10/2014 08:10:57 ET T: 01/10/2014  08:21:37 ET JOB#: [id number]  cc: Dion Body, MD, <Dictator> Dion Body MD ELECTRONICALLY SIGNED 01/29/2014 12:01

## 2014-07-06 ENCOUNTER — Ambulatory Visit: Payer: Self-pay | Admitting: Licensed Clinical Social Worker

## 2014-07-06 ENCOUNTER — Ambulatory Visit: Payer: Self-pay | Admitting: Psychiatry

## 2014-07-14 ENCOUNTER — Ambulatory Visit: Payer: 59 | Admitting: Psychiatry

## 2014-07-22 ENCOUNTER — Ambulatory Visit: Payer: Self-pay | Admitting: Psychiatry

## 2014-07-22 ENCOUNTER — Ambulatory Visit: Payer: Self-pay | Admitting: Licensed Clinical Social Worker

## 2014-07-29 ENCOUNTER — Other Ambulatory Visit: Payer: Self-pay | Admitting: Psychiatry

## 2014-07-29 DIAGNOSIS — F331 Major depressive disorder, recurrent, moderate: Secondary | ICD-10-CM

## 2014-07-29 MED ORDER — ESCITALOPRAM OXALATE 20 MG PO TABS: 20.0000 mg | ORAL_TABLET | Freq: Every day | ORAL | Status: DC

## 2014-07-29 NOTE — Telephone Encounter (Signed)
Patient called clinic today stating she was upset she was told by the pharmacy that she would need an appointment until she could pick up her escitalopram. Patient implied that she had contacted the clinic prosody last week when this writer was away in regards to this matter. I'm unable to find any documentation of phone calls or discussions regarding this medication. However will order patient's escitalopram. She has scheduled a follow-up appointment with this writer for 07/31/2014.

## 2014-07-31 ENCOUNTER — Ambulatory Visit (INDEPENDENT_AMBULATORY_CARE_PROVIDER_SITE_OTHER): Payer: 59 | Admitting: Psychiatry

## 2014-07-31 ENCOUNTER — Encounter: Payer: Self-pay | Admitting: Psychiatry

## 2014-07-31 VITALS — BP 127/81 | HR 79 | Resp 12 | Ht 66.0 in

## 2014-07-31 DIAGNOSIS — F331 Major depressive disorder, recurrent, moderate: Secondary | ICD-10-CM | POA: Diagnosis not present

## 2014-07-31 DIAGNOSIS — F4310 Post-traumatic stress disorder, unspecified: Secondary | ICD-10-CM | POA: Insufficient documentation

## 2014-07-31 DIAGNOSIS — N809 Endometriosis, unspecified: Secondary | ICD-10-CM | POA: Insufficient documentation

## 2014-07-31 DIAGNOSIS — G4700 Insomnia, unspecified: Secondary | ICD-10-CM | POA: Insufficient documentation

## 2014-07-31 DIAGNOSIS — Z8639 Personal history of other endocrine, nutritional and metabolic disease: Secondary | ICD-10-CM | POA: Insufficient documentation

## 2014-07-31 DIAGNOSIS — Z8669 Personal history of other diseases of the nervous system and sense organs: Secondary | ICD-10-CM | POA: Insufficient documentation

## 2014-07-31 DIAGNOSIS — K219 Gastro-esophageal reflux disease without esophagitis: Secondary | ICD-10-CM | POA: Insufficient documentation

## 2014-07-31 DIAGNOSIS — K5790 Diverticulosis of intestine, part unspecified, without perforation or abscess without bleeding: Secondary | ICD-10-CM | POA: Insufficient documentation

## 2014-07-31 DIAGNOSIS — Z8719 Personal history of other diseases of the digestive system: Secondary | ICD-10-CM | POA: Insufficient documentation

## 2014-07-31 DIAGNOSIS — J309 Allergic rhinitis, unspecified: Secondary | ICD-10-CM | POA: Insufficient documentation

## 2014-07-31 DIAGNOSIS — M1990 Unspecified osteoarthritis, unspecified site: Secondary | ICD-10-CM | POA: Insufficient documentation

## 2014-07-31 DIAGNOSIS — F4321 Adjustment disorder with depressed mood: Secondary | ICD-10-CM | POA: Insufficient documentation

## 2014-07-31 DIAGNOSIS — F411 Generalized anxiety disorder: Secondary | ICD-10-CM | POA: Insufficient documentation

## 2014-07-31 DIAGNOSIS — Z8709 Personal history of other diseases of the respiratory system: Secondary | ICD-10-CM | POA: Insufficient documentation

## 2014-07-31 DIAGNOSIS — F332 Major depressive disorder, recurrent severe without psychotic features: Secondary | ICD-10-CM | POA: Insufficient documentation

## 2014-07-31 DIAGNOSIS — F4001 Agoraphobia with panic disorder: Secondary | ICD-10-CM | POA: Insufficient documentation

## 2014-07-31 DIAGNOSIS — K279 Peptic ulcer, site unspecified, unspecified as acute or chronic, without hemorrhage or perforation: Secondary | ICD-10-CM | POA: Insufficient documentation

## 2014-07-31 MED ORDER — ESCITALOPRAM OXALATE 20 MG PO TABS: 20.0000 mg | ORAL_TABLET | Freq: Every day | ORAL | Status: DC

## 2014-07-31 MED ORDER — DIAZEPAM 10 MG PO TABS: 5.0000 mg | ORAL_TABLET | Freq: Two times a day (BID) | ORAL | Status: DC | PRN

## 2014-07-31 MED ORDER — BUSPIRONE HCL 10 MG PO TABS: 20.0000 mg | ORAL_TABLET | Freq: Two times a day (BID) | ORAL | Status: DC

## 2014-07-31 NOTE — Progress Notes (Signed)
BH MD/PA/NP OP Progress Note  07/31/2014 12:06 PM [name]  MRN:  [id number]  Subjective:  Patient returns for follow-up of her major depressive disorder, generalized anxiety disorder and grief. She states overall things have been the same. Things have not gotten worse but they've not gotten any better. She states she continues to struggle with stress and anxiety. She states she's made efforts at her home to try to engage in meditation and relaxation. She states she did try and application on her phone that would take her through meditation but she did not find this helpful. She states that she has tried to do other things such as set upper front porch with curtains and candles and sit with her dogs. She finds this has been helpful to relax.  At the last visit in May we had discussed trying to augment her Lexapro with Rexulti. However patient states she never started the samples of Rexulti because she developed some type of rash and does not want to start a new medication when she had developed a new rash. She now has had the rash under control and think she will go ahead and start the [place].  We discussed what we might do should the augmentation not work. Patient appears to have had a decent response to Wellbutrin in the past. However it looks like it was discontinued because patient felt it was causing her blood pressure to go up. We have discussed that the next visit we may switch to another agent. She states that she and her primary care had discussions about her weight and the primary care did raise the issue of whether antidepressant was causing weight gain. I discussed that some of the antidepressants that are less likely to cause weight gain such as Cymbalta, Effexor or perhaps Wellbutrin. Patient states she has been on Cymbalta before but it caused of suicidal ideation. However the notes indicate that that was the report or reaction to Abilify. Chief Complaint: "same" Visit Diagnosis:    ICD-9-CM ICD-10-CM   1. Major depressive disorder, recurrent episode, moderate 296.32 F33.1 escitalopram (LEXAPRO) 20 MG tablet    Past Medical History: No past medical history on file. No past surgical history on file. Family History: No family history on file. Social History:  History   Social History  . Marital Status: Married    Spouse Name: N/A  . Number of Children: N/A  . Years of Education: N/A   Social History Main Topics  . Smoking status: Former Research scientist (life sciences)  . Smokeless tobacco: Not on file  . Alcohol Use: Not on file  . Drug Use: Not on file  . Sexual Activity: Not on file   Other Topics Concern  . Not on file   Social History Narrative  . No narrative on file   Additional History:   Assessment:   Musculoskeletal: Strength & Muscle Tone: within normal limits Gait & Station: normal Patient leans: N/A  Psychiatric Specialty Exam: HPI  ROS  Blood pressure 127/81, pulse 79, resp. rate 12, height 5\' 6"  (1.676 m).There is no weight on file to calculate BMI.  General Appearance: Neat and Well Groomed  Eye Contact:  Good  Speech:  Normal Rate  Volume:  Normal  Mood:  Same  Affect:  Constricted and She was able to occasionally smile  Thought Process:  Linear and Logical  Orientation:  Full (Time, Place, and Person)  Thought Content:  Negative  Suicidal Thoughts:  No  Homicidal Thoughts:  No  Memory:  Immediate;   Good Recent;   Good Remote;   Good  Judgement:  Good  Insight:  Good  Psychomotor Activity:  Negative  Concentration:  Good  Recall:  Good  Fund of Knowledge: Negative  Language: Good  Akathisia:  Negative  Handed:  Right  AIMS (if indicated):  Not done today.  Assets:  Communication Skills Desire for Improvement Social Support  ADL's:  Intact  Cognition: WNL  Sleep:  Fair 4 hours.   Is the patient at risk to self?  No. Has the patient been a risk to self in the past 6 months?  No. Has the patient been a risk to self within the distant  past?  No. Is the patient a risk to others?  No. Has the patient been a risk to others in the past 6 months?  No. Has the patient been a risk to others within the distant past?  No.  Current Medications: Current Outpatient Prescriptions  Medication Sig Dispense Refill  . busPIRone (BUSPAR) 10 MG tablet Take 2 tablets (20 mg total) by mouth 2 (two) times daily. 120 tablet 2  . diazepam (VALIUM) 10 MG tablet Take 0.5 tablets (5 mg total) by mouth 2 (two) times daily as needed for anxiety. 30 tablet 2  . escitalopram (LEXAPRO) 20 MG tablet Take 1 tablet (20 mg total) by mouth daily. 30 tablet 3   No current facility-administered medications for this visit.    Medical Decision Making:  Established Problem, Stable/Improving (1), Review of Medication Regimen & Side Effects (2) and Review of New Medication or Change in Dosage (2)  Treatment Plan Summary:Medication management and Plan The patient will continue her Lexapro 20 mg a day. She'll continue Valium 5 mg twice a day as needed. She will start the ricks salty as discussed before at the last visit. Taking one half a milligram for 7 days and then increasing to 1 mg daily. She'll follow up in 1 month. She's been encouraged call any questions or concerns prior to her next appointment.   Has been given prescriptions for her Buspar and Valium, 30 day supply with 2 refills.   Faith Rogue 07/31/2014, 12:06 PM

## 2014-09-01 ENCOUNTER — Encounter: Payer: Self-pay | Admitting: Psychiatry

## 2014-09-01 ENCOUNTER — Ambulatory Visit (INDEPENDENT_AMBULATORY_CARE_PROVIDER_SITE_OTHER): Payer: 59 | Admitting: Licensed Clinical Social Worker

## 2014-09-01 ENCOUNTER — Ambulatory Visit (INDEPENDENT_AMBULATORY_CARE_PROVIDER_SITE_OTHER): Payer: 59 | Admitting: Psychiatry

## 2014-09-01 VITALS — BP 132/88 | HR 74 | Temp 97.6°F | Ht 65.0 in | Wt 303.8 lb

## 2014-09-01 DIAGNOSIS — F331 Major depressive disorder, recurrent, moderate: Secondary | ICD-10-CM

## 2014-09-01 DIAGNOSIS — F4310 Post-traumatic stress disorder, unspecified: Secondary | ICD-10-CM | POA: Diagnosis not present

## 2014-09-01 MED ORDER — VENLAFAXINE HCL ER 37.5 MG PO CP24: ORAL_CAPSULE | ORAL | Status: DC

## 2014-09-01 NOTE — Progress Notes (Signed)
THERAPIST PROGRESS NOTE  Session Time: 11:15 a.m. -  12:10 p.m.  Participation Level: Active  Behavioral Response: CasualAlertAnxious and Depressed  Type of Therapy: Individual Therapy  Treatment Goals addressed: Anxiety and Coping  Interventions: Solution Focused, Strength-based, Supportive and Family Systems  Summary: [name] is a 54 y.o. female who returns to OPT after not being seen by LCSW for several months. Her mood was more euthymic and update than LCSW has seen her yet she reports that for the past three weeks she has been sick and with low energy.  Even with ongoing [place], [name] voiced marital dissatisfaction which primarily is around not feeling that her emotional needs are being met.  "I love [name] and I know he loves me."  During time of her mother's death, she reflected on how her siblings had their spouse's with them but her husband was not with her at the hospice home while her mother was dying.  She further commented "I think my family finally saw what I meant."  She has had conversations with her husband about what she needs from him emotionally and with voiced fear ". . . Of being divorced like my parents."  The two have not had marriage counseling.  "My zing and zang aren't matching up."  Having anxiety and reports "I have to leave the grocery store after two isles. It's just when all the people get in there."  Also had to leave restaurant hastily because she felt panicky. [name] would rather stay at home yet with understanding that in order to gain mastery over her anxiety and panics, she will have to continue to expose self to places and situations where her comfort will be tested.  Client and LCSW did agree that getting out to go to a store alone is progress for her.  [name] continues to enjoy time with her 21 poodles and does have contact with several family members.  Her female friend from school who died nearly one year ago has still not  had a memorial service and [name] voiced "I guess they went on and had it without me."  She and spouse did have a discussion about what it was that she gained from the relationship which was having friend's un-interrupted time and attention.  Her experience with spouse is that he is there but not listening with examples provided to LCSW. Client voiced appropriate insight to explain what she thinks is spouse's reasoning for not giving her time citing the demands of his work.  There was not an indication that the two want to participate in marriage counseling.  No new concerns were voiced during session and no indication that client wants to participate in more frequent therapy sessions.  Suicidal/Homicidal: Negativewithout intent/plan; No SIB indicated  Therapist Response:   Supportive psychotherapy with insight was provided to client along with much emotional support and encouragement.  Assisted client to become more aware of common thinking errors that can intensify negative and frightening thoughts and impact on panic symptoms.  Reminded of exposure therapy to assist her to successfully address and experience anxiety and fear in more controlled ways that will prove to client that she can master these thoughts and feelings. Empowered client and spouse to be open to meeting one another's needs and gently pointed out that she and client both have the capacity to make the changes necessary to improve satisfaction in marital relationship.  Explored impact of family of origin dynamics on  how a person develops socially and emotionally and how this often determines their role (s) in a relationship and how effective or ineffective they are at meeting a loved one's needs.  Welcomed calls between sessions PRN.  Plan: Return again within four weeks or PRN.    Diagnosis: Major Depressive Disorder, Recurrent, Moderate   PTSD   Miguel Dibble, LCSW 09/01/2014

## 2014-09-01 NOTE — Progress Notes (Signed)
[place] [place] MD/PA/NP OP Progress Note  09/01/2014 11:06 AM [name]  MRN:  [id number]  Subjective:  Patient returns for follow-up of her major depressive disorder, generalized anxiety disorder and grief. She states overall things have been the same. He states that even with the augmentation with the Rexulti did not notice any improvement. We decided this time it would be appropriate to try another antidepressant agent. We have discussed the options and given that she's had some reactions to Wellbutrin and Cymbalta we have decided on Effexor XR.  Patient continues to do her meditation which she finds has been helpful. Chief Complaint: "same" Visit Diagnosis:   No diagnosis found.  Past Medical History: No past medical history on file. No past surgical history on file. Family History: No family history on file. Social History:  Social History   Social History  . Marital Status: Married    Spouse Name: N/A  . Number of Children: N/A  . Years of Education: N/A   Social History Main Topics  . Smoking status: Former Research scientist (life sciences)  . Smokeless tobacco: Not on file  . Alcohol Use: Not on file  . Drug Use: Not on file  . Sexual Activity: Not on file   Other Topics Concern  . Not on file   Social History Narrative  . No narrative on file   Additional History:   Assessment:   Musculoskeletal: Strength & Muscle Tone: within normal limits Gait & Station: normal Patient leans: N/A  Psychiatric Specialty Exam: HPI  Review of Systems  Psychiatric/Behavioral: Positive for depression. Negative for suicidal ideas, hallucinations, memory loss and substance abuse. The patient is nervous/anxious. The patient does not have insomnia.     There were no vitals taken for this visit.There is no weight on file to calculate BMI.  General Appearance: Neat and Well Groomed  Eye Contact:  Good  Speech:  Normal Rate  Volume:  Normal  Mood:  Same  Affect:  Constricted and She was able to occasionally smile   Thought Process:  Linear and Logical  Orientation:  Full (Time, Place, and Person)  Thought Content:  Negative  Suicidal Thoughts:  No  Homicidal Thoughts:  No  Memory:  Immediate;   Good Recent;   Good Remote;   Good  Judgement:  Good  Insight:  Good  Psychomotor Activity:  Negative  Concentration:  Good  Recall:  Good  Fund of Knowledge: Negative  Language: Good  Akathisia:  Negative  Handed:  Right  AIMS (if indicated):  Not done today.  Assets:  Communication Skills Desire for Improvement Social Support  ADL's:  Intact  Cognition: WNL  Sleep:  Fair 4 hours.   Is the patient at risk to self?  No. Has the patient been a risk to self in the past 6 months?  No. Has the patient been a risk to self within the distant past?  No. Is the patient a risk to others?  No. Has the patient been a risk to others in the past 6 months?  No. Has the patient been a risk to others within the distant past?  No.  Current Medications: Current Outpatient Prescriptions  Medication Sig Dispense Refill  . busPIRone (BUSPAR) 10 MG tablet Take 2 tablets (20 mg total) by mouth 2 (two) times daily. 120 tablet 2  . diazepam (VALIUM) 10 MG tablet Take 0.5 tablets (5 mg total) by mouth 2 (two) times daily as needed for anxiety. 30 tablet 2  . escitalopram (LEXAPRO) 20  MG tablet Take 1 tablet (20 mg total) by mouth daily. 30 tablet 3  . venlafaxine XR (EFFEXOR-XR) 37.5 MG 24 hr capsule Take one tablet int he morning for seven days then increase to two tablets in the morning. 60 capsule 1   No current facility-administered medications for this visit.    Medical Decision Making:  Established Problem, Stable/Improving (1), Review of Medication Regimen & Side Effects (2) and Review of New Medication or Change in Dosage (2)  Treatment Plan Summary:Medication management and Plan she will discontinue the Lexapro. Will start Effexor XR 37.5 mg in the morning for 7 days and then she'll go to 75 mg in the  morning. Risk and benefits of been discussing patient's able consent. She'll continue her Valium and BuSpar as previous prescribed her to follow up in 1 month. She's been encouraged call any questions or concerns prior to her next appointment. She will continue in her therapy.  Faith Rogue 09/01/2014, 11:06 AM

## 2014-10-14 DIAGNOSIS — R0681 Apnea, not elsewhere classified: Secondary | ICD-10-CM | POA: Insufficient documentation

## 2014-10-14 DIAGNOSIS — G4733 Obstructive sleep apnea (adult) (pediatric): Secondary | ICD-10-CM | POA: Insufficient documentation

## 2014-11-18 ENCOUNTER — Ambulatory Visit (INDEPENDENT_AMBULATORY_CARE_PROVIDER_SITE_OTHER): Payer: 59 | Admitting: Licensed Clinical Social Worker

## 2014-11-18 ENCOUNTER — Ambulatory Visit: Payer: 59 | Admitting: Psychiatry

## 2014-11-18 DIAGNOSIS — F4310 Post-traumatic stress disorder, unspecified: Secondary | ICD-10-CM

## 2014-11-18 DIAGNOSIS — F331 Major depressive disorder, recurrent, moderate: Secondary | ICD-10-CM

## 2014-11-18 NOTE — Progress Notes (Signed)
THERAPIST PROGRESS NOTE  Session Time:   11:15- 12:20 p.m.  Participation Level: Active  Behavioral Response: CasualAlertAnxious, Depressed and Irritable  Type of Therapy: Individual Therapy  Treatment Goals addressed: Anxiety, Coping and Diagnosis: effectively managing PTSD  Interventions: CBT, Solution Focused, Strength-based, Supportive, Family Systems and Reframing  Summary: [name] is a 54 y.o. female who returns to OPT after not being in therapy for several months.  Client placed blame on this clinic for not calling to re-schedule her appointment and without responsibility for her own choices voiced.  In terms of medication adherence, [name] informed [name] that she began to feel bloated,not passing gas, with the Effexor 37.5 mg after taking this dose for three weeks.  [name] stopped this medication about two weeks ago thinking that she would get some relief.  "I did get some relief after I stopped the Venlafaxine and started back on 10mg  of Lexapro."  She did feel there were more benefits in managing her depressive symptoms while on the Effexor vs. Lexapro and stated "Maybe I'll go back on it."  Session focused on client's fears of rejection and abandonment and we processed the impact of receiving the letter about [name]'s resignation on her mood and behaviors.  Client showed [name] three superficial marks on her left upper arm where she self injured after reading the letter about therapist's leaving this clinic.  [name] shared another traumatic childhood experience and how this was triggered for her after seeing the letter.  "Do you understand now why this bothers me?"  Client assured [name] "I'm not mad at you. I know these things happen."  No SIB since incident about two weeks ago.    Additional focus of session on client's PTSD symptoms and her need for spouse and siblings to understand what this is like for her rather then being told "Let it go. Get over it."  On a positive note, she and  spouse, [name], continue to travel [place] and [name] enjoys spending time with one of her sisters and her poodles take up a lot of her time.  Discussed benefits of therapy for her and thanked [name] for support over the years.  She was receptive to meeting Dawn Piedra, [name] in this clinic and informed [name] that [employer] season is the hardest for her.    No immediate risk factors voiced or assessed.  Client in need of follow up appointment with [name] since she came late to appointment today with him and could not be seen.  Suicidal/Homicidal: Negativewithout intent/plan  Therapist Response:   Assessed client's functional status and for safety risk factors during session.  Supportive psychotherapy with insight was provided to client along with much emotional support and encouragement. Validated her emotional and behavioral reactions as part of a perceived fear of rejection and abandonment while reassuring client that it represents a change versus her being rejected.  Normalized the ongoing struggles associated with living with PTSD and again urged client to read more about this and how to find our more about different self care strategies.  Offered educational hand-out on living with SIB and suicidal ideation and thoughts as part of depressive symptoms and PTSD.  Encouraged her to consider this focus for ongoing OPT.  Assisted her to re-frame examples of cognitive distortions that seem to negatively impact her mood and outlook.  Thanked client for trusting [name] over the course of our work and reassured her that she can continue to  explore OPT options for enhancing quality of life and resolution of deep emotional scars.  Empowered client to take responsibility for her treatment and to advocate for what she needs.  Introduced client to [employer], [employer]  Plan: Return again within four weeks or sooner to establish care with [name].  [name] will remain adherent to  medications prescribed and continue to see [name] in this clinic.  [name] will be more pro-active in scheduling and keeping appointments and notifying providers of any side effects of medication.  Client will continue to be an active participate in her treatment.  Diagnosis: Major Depressive Disorder, Recurrent, Moderate   PTSD  Dawn Dibble, [name] 11/18/2014

## 2014-11-30 ENCOUNTER — Telehealth: Payer: Self-pay

## 2014-11-30 NOTE — Telephone Encounter (Signed)
pt states that she has stopped everything her lexapro and her effexor pt states that she didn't know if she should start on both.  pt states that during the time she was having her side pain she was also having a diverticulosis. Her main question is, is it ok to start back on the lexapro and the effexor

## 2014-11-30 NOTE — Telephone Encounter (Signed)
pt states that tina was suppose to ask dr. Jimmye Norman about if she continue with the effexor t pt states that she has not taken in the medication for about 2 and 1/2 weeks now because she had side pain and up set stomach.  Pt states she did not know if it was the medication causing or if she had a stomach bug.  Pt wanted to know if she should try the effexor again or if she needs to try something else. Pt states that he depression is getting worse without something. Pt has appt on 12-17-14 last seen on  09-01-14.  Seen tina on  11-18-14

## 2014-12-09 NOTE — Telephone Encounter (Signed)
pt has a appt on 12-17-14. will discuss at this appt.

## 2014-12-17 ENCOUNTER — Ambulatory Visit (INDEPENDENT_AMBULATORY_CARE_PROVIDER_SITE_OTHER): Payer: 59 | Admitting: Psychiatry

## 2014-12-17 ENCOUNTER — Encounter: Payer: Self-pay | Admitting: Psychiatry

## 2014-12-17 ENCOUNTER — Ambulatory Visit (INDEPENDENT_AMBULATORY_CARE_PROVIDER_SITE_OTHER): Payer: 59 | Admitting: Licensed Clinical Social Worker

## 2014-12-17 VITALS — BP 126/84 | HR 85 | Temp 98.4°F | Ht 65.0 in | Wt 303.0 lb

## 2014-12-17 DIAGNOSIS — F4310 Post-traumatic stress disorder, unspecified: Secondary | ICD-10-CM | POA: Diagnosis not present

## 2014-12-17 DIAGNOSIS — F331 Major depressive disorder, recurrent, moderate: Secondary | ICD-10-CM

## 2014-12-17 MED ORDER — BUSPIRONE HCL 10 MG PO TABS: 20.0000 mg | ORAL_TABLET | Freq: Two times a day (BID) | ORAL | Status: DC

## 2014-12-17 MED ORDER — DESVENLAFAXINE SUCCINATE ER 50 MG PO TB24: 50.0000 mg | ORAL_TABLET | ORAL | Status: DC

## 2014-12-17 MED ORDER — DIAZEPAM 10 MG PO TABS: 5.0000 mg | ORAL_TABLET | Freq: Two times a day (BID) | ORAL | Status: DC | PRN

## 2014-12-17 MED ORDER — DESVENLAFAXINE SUCCINATE ER 50 MG PO TB24
50.0000 mg | ORAL_TABLET | ORAL | Status: DC
Start: 2014-12-17 — End: 2014-12-17

## 2014-12-17 NOTE — Progress Notes (Signed)
[facility] MD/PA/NP OP Progress Note  12/17/2014 2:50 PM [name]  MRN:  [id number]  Subjective:  Patient returns for follow-up of her major depressive disorder, generalized anxiety disorder and grief. Patient indicated she's not doing well at all. She stated she was upset because she never got contacted the office as to what to do when she was having side effects from medication. She at her last appointment in August started Effexor and states it was helping her mood but she states it began giving her significant GI upset. The doctor mentation the record indicates she did call and had inquired about the medication. The record indicates she indicated she had stopped both medications and then contacted the clinic to ask whether to start back on Lexapro which is her medication prior to getting on the Effexor. Per the document CMA communicate with me and asked and my response was to wait and discuss it with me at the next appointment since I had not seen her since August and she was off of the medication.. Patient indicates she never got a call back with this information.  She indicated she continues to be depressed, has crying episodes and does not enjoy anything. Process that she could try to build some events that would at least stimulate her mind or get her active. Patient indicated she felt she could probably motivate herself to do these things but when she gets out in public she has a panic attack. She states largely she keeps to herself and stays in her house with her dogs. She states she does spend time taking her sister to her sister's medical appointments.  She indicated that the Effexor did help her mood and given that it helped her mood I suggested we try another medication within that same dual acting family. Patient has not been on. She has been on Cymbalta but reported to me in the past it caused her to have suicidal ideation. Chief Complaint:  Chief Complaint    Follow-up; Medication Problem;  Medication Reaction; Medication Refill    "same" Visit Diagnosis:     ICD-9-CM ICD-10-CM   1. Major depressive disorder, recurrent episode, moderate (HCC) 296.32 F33.1   2. Post traumatic stress disorder (PTSD) 309.81 F43.10     Past Medical History:  Past Medical History  Diagnosis Date  . Asthma   . Depression   . Anxiety   . Liver disease   . Diverticulitis     Past Surgical History  Procedure Laterality Date  . Sinusotomy    . Appendectomy    . Cholecystectomy    . Abdominal hysterectomy     Family History:  Family History  Problem Relation Age of Onset  . Diabetes Mother   . Muscular dystrophy Mother   . Heart failure Mother   . Anxiety disorder Mother   . Skin cancer Father   . Heart disease Father   . Hypertension Father   . Depression Father   . Alcohol abuse Father   . Drug abuse Father   . Heart disease Sister   . Diabetes Sister   . Anxiety disorder Sister   . Hypertension Brother   . Diverticulosis Brother   . Heart disease Sister   . Diabetes Sister   . Heart disease Sister   . Diabetes Sister    Social History:  Social History   Social History  . Marital Status: Married    Spouse Name: N/A  . Number of Children: N/A  . Years of  Education: N/A   Social History Main Topics  . Smoking status: Former Smoker    Types: Cigarettes    Quit date: 08/31/2004  . Smokeless tobacco: Never Used  . Alcohol Use: No  . Drug Use: No  . Sexual Activity: Yes    Birth Control/ Protection: None   Other Topics Concern  . None   Social History Narrative   Additional History:   Assessment:   Musculoskeletal: Strength & Muscle Tone: within normal limits Gait & Station: normal Patient leans: N/A  Psychiatric Specialty Exam: HPI  Review of Systems  Psychiatric/Behavioral: Positive for depression. Negative for suicidal ideas, hallucinations, memory loss and substance abuse. The patient is nervous/anxious. The patient does not have insomnia.   All  other systems reviewed and are negative.   Blood pressure 126/84, pulse 85, temperature 98.4 F (36.9 C), temperature source Tympanic, height 5\' 5"  (1.651 m), weight 303 lb (137.44 kg), SpO2 95 %.Body mass index is 50.42 kg/(m^2).  General Appearance: Neat and Well Groomed  Eye Contact:  Good  Speech:  Normal Rate  Volume:  Normal  Mood:  Same  Affect:  Labile and Tearful  Thought Process:  Linear and Logical  Orientation:  Full (Time, Place, and Person)  Thought Content:  Negative  Suicidal Thoughts:  No  Homicidal Thoughts:  No  Memory:  Immediate;   Good Recent;   Good Remote;   Good  Judgement:  Good  Insight:  Good  Psychomotor Activity:  Negative  Concentration:  Good  Recall:  Good  Fund of Knowledge: Negative  Language: Good  Akathisia:  Negative  Handed:  Right  AIMS (if indicated):  Not done today.  Assets:  Communication Skills Desire for Improvement Social Support  ADL's:  Intact  Cognition: WNL  Sleep:  Fair 4 hours.   Is the patient at risk to self?  No. Has the patient been a risk to self in the past 6 months?  No. Has the patient been a risk to self within the distant past?  No. Is the patient a risk to others?  No. Has the patient been a risk to others in the past 6 months?  No. Has the patient been a risk to others within the distant past?  No.  Current Medications: Current Outpatient Prescriptions  Medication Sig Dispense Refill  . acetaminophen-codeine (TYLENOL #3) 300-30 MG per tablet TK 1 T PO ONCE D PRN P  0  . busPIRone (BUSPAR) 10 MG tablet Take 2 tablets (20 mg total) by mouth 2 (two) times daily. 120 tablet 2  . cetirizine (ZYRTEC ALLERGY) 10 MG tablet Take by mouth.    . cyclobenzaprine (FLEXERIL) 10 MG tablet Take by mouth.    . diazepam (VALIUM) 10 MG tablet Take 0.5 tablets (5 mg total) by mouth 2 (two) times daily as needed for anxiety. 30 tablet 2  . Fluticasone-Salmeterol (ADVAIR DISKUS) 250-50 MCG/DOSE AEPB Inhale into the lungs.     Marland Kitchen ipratropium-albuterol (DUONEB) 0.5-2.5 (3) MG/3ML SOLN Inhale into the lungs.    . metroNIDAZOLE (FLAGYL) 500 MG tablet   0  . mometasone (NASONEX) 50 MCG/ACT nasal spray Place into the nose.    . montelukast (SINGULAIR) 10 MG tablet Take by mouth.    . MULTIPLE VITAMIN PO Take by mouth.    . Olopatadine HCl 0.2 % SOLN Apply to eye.    Marland Kitchen omeprazole (PRILOSEC) 20 MG capsule Take by mouth.    . topiramate (TOPAMAX) 200 MG tablet     .  Vitamin D, Ergocalciferol, (DRISDOL) 50000 UNITS CAPS capsule     . zolpidem (AMBIEN) 5 MG tablet Take by mouth.    . desvenlafaxine (PRISTIQ) 50 MG 24 hr tablet Take 1 tablet (50 mg total) by mouth every morning. 30 tablet 1   No current facility-administered medications for this visit.    Medical Decision Making:  Established Problem, Stable/Improving (1), Review of Medication Regimen & Side Effects (2) and Review of New Medication or Change in Dosage (2)  Treatment Plan Summary:Medication management and Plan   Major depressive disorder, recurrent, moderate-we will start Pristiq 50 mg in the morning. Risk and benefits of been discussing patient's able to consent.  PTSD-continue therapy as well as Pristiq as noted above.  Generalized anxiety disorder-continue BuSpar and Valium.  Insomnia-patient takes Ambien 5 mg at bedtime as needed. She does not need any more of this medication today.  Therapy to develop coping skills and CBT   Faith Rogue 12/17/2014, 2:50 PM

## 2014-12-17 NOTE — Progress Notes (Signed)
Spoke with [name] to discontinue medications pt last filled the lexapro 08-26-14 and the effexor was on  10-19-14

## 2014-12-23 NOTE — Progress Notes (Signed)
   THERAPIST PROGRESS NOTE  Session Time: 53min  Participation Level: Active  Behavioral Response: CasualAlertDepressed  Type of Therapy: Individual Therapy  Treatment Goals addressed: Coping  Interventions: CBT, Motivational Interviewing, Solution Focused, Supportive and Reframing  Summary: [name] is a 54 y.o. female who presents with continued symptoms of her diagnosis.  She states that she is upset that her previous therapist left this practice without notifying her in advance.  She states that she attends therapy occasionally but she wants to continue on a regular basis.  She states that she continues to be stressed out about various things such as finances and her past trauma. Discussed coping skills and encouraged her to attend therapy & medication management regularly.   Suicidal/Homicidal: Nowithout intent/plan  Therapist Response: LCSW provided Patient with ongoing emotional support and encouragement.  Normalized her feelings.  Commended Patient on her progress and reinforced the importance of client staying focused on her own strengths and resources and resiliency. Processed various strategies for dealing with stressors.    Plan: Return again in 2 weeks.  Diagnosis: Axis I: Major Depression, Recurrent, Moderate    Axis II: No diagnosis    Lubertha South 12/23/2014

## 2014-12-25 ENCOUNTER — Telehealth: Payer: Self-pay | Admitting: Psychiatry

## 2014-12-25 NOTE — Telephone Encounter (Signed)
pt was given 2 pks of samples of pristiq and a saving card.  pt was told to activate card first.  samples lot # [id number] exp 8-18

## 2014-12-25 NOTE — Telephone Encounter (Signed)
Samples were provided to the patient. AW

## 2015-01-13 ENCOUNTER — Telehealth: Payer: Self-pay

## 2015-01-13 NOTE — Telephone Encounter (Signed)
pt called states she can not take the pristiq and that she had stopped taking that and went back on the lexapro that she still had left over.

## 2015-01-19 ENCOUNTER — Encounter: Payer: Self-pay | Admitting: Psychiatry

## 2015-01-19 ENCOUNTER — Ambulatory Visit (INDEPENDENT_AMBULATORY_CARE_PROVIDER_SITE_OTHER): Payer: 59 | Admitting: Psychiatry

## 2015-01-19 ENCOUNTER — Ambulatory Visit: Payer: 59 | Admitting: Licensed Clinical Social Worker

## 2015-01-19 VITALS — BP 142/98 | HR 94 | Temp 98.6°F | Ht 65.0 in | Wt 296.8 lb

## 2015-01-19 DIAGNOSIS — F4310 Post-traumatic stress disorder, unspecified: Secondary | ICD-10-CM

## 2015-01-19 DIAGNOSIS — F331 Major depressive disorder, recurrent, moderate: Secondary | ICD-10-CM

## 2015-01-19 MED ORDER — VORTIOXETINE HBR 10 MG PO TABS: 10.0000 mg | ORAL_TABLET | ORAL | Status: DC

## 2015-01-19 NOTE — Progress Notes (Signed)
Pharmacy notified.

## 2015-01-19 NOTE — Progress Notes (Signed)
[facility] MD/PA/NP OP Progress Note  01/19/2015 4:13 PM MCKINLEY COLN  MRN:  [id number]  Subjective:  Patient returns for follow-up of her major depressive disorder, generalized anxiety disorder and grief. At the last visit we had started the patient on Pristiq. She indicates she felt the medication improved her mood significantly. However she states she had GI upset, diarrhea and cramping and that she is discontinued that medication. She states since she is discontinued it she states she is now experiencing more depression. We reviewed her history and she has been on several depressants that of all caused her side effects. I did spend some time discussing perhaps looking at other alternatives such as transmit magnetic stimulation or ECT as possible options. At this point where going to try Trintellix.  Chief Complaint: Depressed Chief Complaint    Follow-up; Medication Refill    "same" Visit Diagnosis:     ICD-9-CM ICD-10-CM   1. Major depressive disorder, recurrent episode, moderate (HCC) 296.32 F33.1   2. Post traumatic stress disorder (PTSD) 309.81 F43.10     Past Medical History:  Past Medical History  Diagnosis Date  . Asthma   . Depression   . Anxiety   . Liver disease   . Diverticulitis     Past Surgical History  Procedure Laterality Date  . Sinusotomy    . Appendectomy    . Cholecystectomy    . Abdominal hysterectomy     Family History:  Family History  Problem Relation Age of Onset  . Diabetes Mother   . Muscular dystrophy Mother   . Heart failure Mother   . Anxiety disorder Mother   . Skin cancer Father   . Heart disease Father   . Hypertension Father   . Depression Father   . Alcohol abuse Father   . Drug abuse Father   . Heart disease Sister   . Diabetes Sister   . Anxiety disorder Sister   . Hypertension Brother   . Diverticulosis Brother   . Heart disease Sister   . Diabetes Sister   . Heart disease Sister   . Diabetes Sister    Social History:  Social  History   Social History  . Marital Status: Married    Spouse Name: N/A  . Number of Children: N/A  . Years of Education: N/A   Social History Main Topics  . Smoking status: Former Smoker    Types: Cigarettes    Quit date: 08/31/2004  . Smokeless tobacco: Never Used  . Alcohol Use: No  . Drug Use: No  . Sexual Activity: Yes    Birth Control/ Protection: None   Other Topics Concern  . None   Social History Narrative   Additional History:   Assessment:   Musculoskeletal: Strength & Muscle Tone: within normal limits Gait & Station: normal Patient leans: N/A  Psychiatric Specialty Exam: HPI  Review of Systems  Psychiatric/Behavioral: Positive for depression. Negative for suicidal ideas, hallucinations, memory loss and substance abuse. The patient is nervous/anxious. The patient does not have insomnia.   All other systems reviewed and are negative.   Blood pressure 142/98, pulse 94, temperature 98.6 F (37 C), temperature source Tympanic, height 5\' 5"  (1.651 m), weight 296 lb 12.8 oz (134.628 kg), SpO2 95 %.Body mass index is 49.39 kg/(m^2).  General Appearance: Neat and Well Groomed  Eye Contact:  Good  Speech:  Normal Rate  Volume:  Normal  Mood:  depressed  Affect:  Constricted  Thought Process:  Linear and  Logical  Orientation:  Full (Time, Place, and Person)  Thought Content:  Negative  Suicidal Thoughts:  No  Homicidal Thoughts:  No  Memory:  Immediate;   Good Recent;   Good Remote;   Good  Judgement:  Good  Insight:  Good  Psychomotor Activity:  Negative  Concentration:  Good  Recall:  Good  Fund of Knowledge: Negative  Language: Good  Akathisia:  Negative  Handed:  Right  AIMS (if indicated):  Not done today.  Assets:  Communication Skills Desire for Improvement Social Support  ADL's:  Intact  Cognition: WNL  Sleep:  Fair 4 hours.   Is the patient at risk to self?  No. Has the patient been a risk to self in the past 6 months?  No. Has the  patient been a risk to self within the distant past?  No. Is the patient a risk to others?  No. Has the patient been a risk to others in the past 6 months?  No. Has the patient been a risk to others within the distant past?  No.  Current Medications: Current Outpatient Prescriptions  Medication Sig Dispense Refill  . acetaminophen-codeine (TYLENOL #3) 300-30 MG per tablet TK 1 T PO ONCE D PRN P  0  . busPIRone (BUSPAR) 10 MG tablet Take 2 tablets (20 mg total) by mouth 2 (two) times daily. 120 tablet 2  . cetirizine (ZYRTEC ALLERGY) 10 MG tablet Take by mouth.    . cyclobenzaprine (FLEXERIL) 10 MG tablet Take by mouth.    . diazepam (VALIUM) 10 MG tablet Take 0.5 tablets (5 mg total) by mouth 2 (two) times daily as needed for anxiety. 30 tablet 2  . Fluticasone-Salmeterol (ADVAIR DISKUS) 250-50 MCG/DOSE AEPB Inhale into the lungs.    Marland Kitchen ipratropium-albuterol (DUONEB) 0.5-2.5 (3) MG/3ML SOLN Inhale into the lungs.    . metroNIDAZOLE (FLAGYL) 500 MG tablet   0  . mometasone (NASONEX) 50 MCG/ACT nasal spray Place into the nose.    . montelukast (SINGULAIR) 10 MG tablet Take by mouth.    . MULTIPLE VITAMIN PO Take by mouth.    . Olopatadine HCl 0.2 % SOLN Apply to eye.    Marland Kitchen omeprazole (PRILOSEC) 20 MG capsule Take by mouth.    . topiramate (TOPAMAX) 200 MG tablet     . torsemide (DEMADEX) 10 MG tablet Take by mouth.    . Vitamin D, Ergocalciferol, (DRISDOL) 50000 UNITS CAPS capsule     . zolpidem (AMBIEN) 5 MG tablet Take by mouth.    . Vortioxetine HBr (TRINTELLIX) 10 MG TABS Take 1 tablet (10 mg total) by mouth every morning. 30 tablet 1   No current facility-administered medications for this visit.    Medical Decision Making:  Established Problem, Stable/Improving (1), Review of Medication Regimen & Side Effects (2) and Review of New Medication or Change in Dosage (2)  Treatment Plan Summary:Medication management and Plan   Major depressive disorder, recurrent, moderate-we'll start  Trintellix 10 mg in the morning. Risk and benefits of been discussing patient's able to consent  PTSD-continue therapy as well as Pristiq as noted above.  Generalized anxiety disorder-continue BuSpar and Valium.  Insomnia-patient takes Ambien 5 mg at bedtime as needed. She does not need any more of this medication today. She states she's takes the Ambien only rarely when she is not able to sleep.     Faith Rogue 01/19/2015, 4:13 PM

## 2015-02-05 ENCOUNTER — Ambulatory Visit
Admission: RE | Admit: 2015-02-05 | Discharge: 2015-02-05 | Disposition: A | Discharge status: Home / Self Care | Payer: Medicare Other | Source: Ambulatory Visit | Attending: Physician Assistant | Admitting: Physician Assistant

## 2015-02-05 ENCOUNTER — Other Ambulatory Visit: Payer: Self-pay | Admitting: Physician Assistant

## 2015-02-05 DIAGNOSIS — R110 Nausea: Secondary | ICD-10-CM | POA: Diagnosis present

## 2015-02-05 DIAGNOSIS — K760 Fatty (change of) liver, not elsewhere classified: Secondary | ICD-10-CM | POA: Diagnosis not present

## 2015-02-05 DIAGNOSIS — R1032 Left lower quadrant pain: Secondary | ICD-10-CM

## 2015-02-05 DIAGNOSIS — Z8719 Personal history of other diseases of the digestive system: Secondary | ICD-10-CM | POA: Diagnosis present

## 2015-02-05 DIAGNOSIS — K5732 Diverticulitis of large intestine without perforation or abscess without bleeding: Secondary | ICD-10-CM | POA: Diagnosis not present

## 2015-02-18 ENCOUNTER — Encounter: Payer: Self-pay | Admitting: Psychiatry

## 2015-02-18 ENCOUNTER — Ambulatory Visit (INDEPENDENT_AMBULATORY_CARE_PROVIDER_SITE_OTHER): Payer: 59 | Admitting: Psychiatry

## 2015-02-18 VITALS — BP 122/84 | HR 105 | Temp 98.5°F | Ht 65.0 in | Wt 288.4 lb

## 2015-02-18 DIAGNOSIS — F4310 Post-traumatic stress disorder, unspecified: Secondary | ICD-10-CM | POA: Diagnosis not present

## 2015-02-18 DIAGNOSIS — F331 Major depressive disorder, recurrent, moderate: Secondary | ICD-10-CM

## 2015-02-18 MED ORDER — BUSPIRONE HCL 10 MG PO TABS: 20.0000 mg | ORAL_TABLET | Freq: Two times a day (BID) | ORAL | Status: DC

## 2015-02-18 MED ORDER — PRAZOSIN HCL 1 MG PO CAPS: 1.0000 mg | ORAL_CAPSULE | Freq: Every day | ORAL | Status: DC

## 2015-02-18 MED ORDER — DIAZEPAM 10 MG PO TABS: 5.0000 mg | ORAL_TABLET | Freq: Two times a day (BID) | ORAL | Status: DC | PRN

## 2015-02-18 MED ORDER — ZOLPIDEM TARTRATE 5 MG PO TABS: 5.0000 mg | ORAL_TABLET | Freq: Every evening | ORAL | Status: DC | PRN

## 2015-02-18 MED ORDER — ESCITALOPRAM OXALATE 10 MG PO TABS: ORAL_TABLET | ORAL | Status: DC

## 2015-02-18 NOTE — Progress Notes (Signed)
[facility] MD/PA/NP OP Progress Note  02/18/2015 12:15 PM [name]  MRN:  [id number]  Subjective:  Patient returns for follow-up of her major depressive disorder, generalized anxiety disorder and grief. She states today that she is not doing well. At the last visit we had started Trintellix. However patient is gastroenterologist said that this medication might of been causing her some gastrointestinal upset and thus patient has discontinued this medicine. She had previous had a response to Pristiq but then it causes gastrointestinal upset. She states that GI doctor encouraged her to get off of this class of medicines and perhaps return to Lexapro. Patient states that the Lexapro was effective for her but it caused weight gain. She states in hindsight she is not sure her weight gain was associated with Lexapro.  He also indicates that she's having night terrors. She states someone she can remember the dreams but other times she can't remember what happened but knows that she wakes up in a panic stating cannot go back to sleep. Chief Complaint: Depressed Chief Complaint    Follow-up; Medication Refill; Other; Panic Attack; Anxiety; Stress; Fatigue; night terrors    "same" Visit Diagnosis:     ICD-9-CM ICD-10-CM   1. Major depressive disorder, recurrent episode, moderate (HCC) 296.32 F33.1   2. Post traumatic stress disorder (PTSD) 309.81 F43.10     Past Medical History:  Past Medical History  Diagnosis Date  . Asthma   . Depression   . Anxiety   . Liver disease   . Diverticulitis     Past Surgical History  Procedure Laterality Date  . Sinusotomy    . Appendectomy    . Cholecystectomy    . Abdominal hysterectomy     Family History:  Family History  Problem Relation Age of Onset  . Diabetes Mother   . Muscular dystrophy Mother   . Heart failure Mother   . Anxiety disorder Mother   . Skin cancer Father   . Heart disease Father   . Hypertension Father   . Depression Father   .  Alcohol abuse Father   . Drug abuse Father   . Heart disease Sister   . Diabetes Sister   . Anxiety disorder Sister   . Hypertension Brother   . Diverticulosis Brother   . Heart disease Sister   . Diabetes Sister   . Heart disease Sister   . Diabetes Sister    Social History:  Social History   Social History  . Marital Status: Married    Spouse Name: N/A  . Number of Children: N/A  . Years of Education: N/A   Social History Main Topics  . Smoking status: Former Smoker    Types: Cigarettes    Quit date: 08/31/2004  . Smokeless tobacco: Never Used  . Alcohol Use: No  . Drug Use: No  . Sexual Activity: Yes    Birth Control/ Protection: None   Other Topics Concern  . None   Social History Narrative   Additional History:   Assessment:   Musculoskeletal: Strength & Muscle Tone: within normal limits Gait & Station: normal Patient leans: N/A  Psychiatric Specialty Exam: Anxiety Symptoms include nervous/anxious behavior. Patient reports no insomnia or suicidal ideas.      Review of Systems  Psychiatric/Behavioral: Positive for depression. Negative for suicidal ideas, hallucinations, memory loss and substance abuse. The patient is nervous/anxious. The patient does not have insomnia.   All other systems reviewed and are negative.   Blood pressure 122/84,  pulse 105, temperature 98.5 F (36.9 C), temperature source Tympanic, height 5\' 5"  (1.651 m), weight 288 lb 6.4 oz (130.817 kg), SpO2 96 %.Body mass index is 47.99 kg/(m^2).  General Appearance: Neat and Well Groomed  Eye Contact:  Good  Speech:  Normal Rate  Volume:  Normal  Mood:  depressed  Affect:  Constricted  Thought Process:  Linear and Logical  Orientation:  Full (Time, Place, and Person)  Thought Content:  Negative  Suicidal Thoughts:  No  Homicidal Thoughts:  No  Memory:  Immediate;   Good Recent;   Good Remote;   Good  Judgement:  Good  Insight:  Good  Psychomotor Activity:  Negative   Concentration:  Good  Recall:  Good  Fund of Knowledge: Negative  Language: Good  Akathisia:  Negative  Handed:  Right  AIMS (if indicated):  Not done today.  Assets:  Communication Skills Desire for Improvement Social Support  ADL's:  Intact  Cognition: WNL  Sleep:  Fair 4 hours.   Is the patient at risk to self?  No. Has the patient been a risk to self in the past 6 months?  No. Has the patient been a risk to self within the distant past?  No. Is the patient a risk to others?  No. Has the patient been a risk to others in the past 6 months?  No. Has the patient been a risk to others within the distant past?  No.  Current Medications: Current Outpatient Prescriptions  Medication Sig Dispense Refill  . ANUCORT-HC 25 MG suppository UNW AND I 1 SUP REC BID  0  . busPIRone (BUSPAR) 10 MG tablet Take 2 tablets (20 mg total) by mouth 2 (two) times daily. 120 tablet 3  . cetirizine (ZYRTEC ALLERGY) 10 MG tablet Take by mouth.    . diazepam (VALIUM) 10 MG tablet Take 0.5 tablets (5 mg total) by mouth 2 (two) times daily as needed for anxiety. 30 tablet 3  . Fluticasone-Salmeterol (ADVAIR DISKUS) 250-50 MCG/DOSE AEPB Inhale into the lungs.    . hydrocortisone (ANUSOL-HC) 25 MG suppository Place rectally.    Marland Kitchen ipratropium-albuterol (DUONEB) 0.5-2.5 (3) MG/3ML SOLN Inhale into the lungs.    . metroNIDAZOLE (FLAGYL) 500 MG tablet   0  . mometasone (NASONEX) 50 MCG/ACT nasal spray Place into the nose.    . montelukast (SINGULAIR) 10 MG tablet Take by mouth.    . MULTIPLE VITAMIN PO Take by mouth.    . Olopatadine HCl 0.2 % SOLN Apply to eye.    Marland Kitchen omeprazole (PRILOSEC) 20 MG capsule Take by mouth.    . topiramate (TOPAMAX) 200 MG tablet     . torsemide (DEMADEX) 10 MG tablet Take by mouth.    . Vitamin D, Ergocalciferol, (DRISDOL) 50000 UNITS CAPS capsule     . zolpidem (AMBIEN) 5 MG tablet Take 1 tablet (5 mg total) by mouth at bedtime as needed for sleep. 30 tablet 3  . escitalopram  (LEXAPRO) 10 MG tablet Take one half a tablet in  morning  For seven days then increase to one whole tablet daily. 30 tablet 3  . prazosin (MINIPRESS) 1 MG capsule Take 1 capsule (1 mg total) by mouth at bedtime. 30 capsule 3   No current facility-administered medications for this visit.    Medical Decision Making:  Established Problem, Stable/Improving (1), Review of Medication Regimen & Side Effects (2) and Review of New Medication or Change in Dosage (2)  Treatment Plan Summary:Medication management and  Plan   Major depressive disorder, recurrent, moderate-we will going to restart the patient back on Lexapro as she now states that her mood did respond favorably to it. We'll start Lexapro 5 mg in the morning for 7 days and then she'll go to 10 mg in the morning. Skin benefits of been discussing patient's able consent.  PTSD-Lexapro as above. We will also start some prazosin 1 mg at bedtime to address her nightmares. Risk and benefits of been discussing patient's able consent.  Generalized anxiety disorder-continue BuSpar and Valium.  Insomnia-patient takes Ambien 5 mg at bedtime as needed. She does not need any more of this medication today. She states she's takes the Ambien only rarely when she is not able to sleep.  Patient is aware that I'm leaving the practice and that she will follow-up with a new psychiatrist. She has been encouraged call any questions or concerns prior to her next appointment.   Faith Rogue 02/18/2015, 12:15 PM

## 2015-02-25 DIAGNOSIS — Z6841 Body Mass Index (BMI) 40.0 and over, adult: Secondary | ICD-10-CM

## 2015-03-18 ENCOUNTER — Ambulatory Visit: Payer: 59 | Admitting: Psychiatry

## 2015-03-29 ENCOUNTER — Encounter: Admission: RE | Payer: Self-pay | Source: Ambulatory Visit

## 2015-03-29 ENCOUNTER — Ambulatory Visit
Admission: RE | Admit: 2015-03-29 | Payer: Medicare Other | Source: Ambulatory Visit | Admitting: Unknown Physician Specialty

## 2015-03-29 SURGERY — COLONOSCOPY WITH PROPOFOL
Anesthesia: General

## 2015-03-31 ENCOUNTER — Ambulatory Visit (INDEPENDENT_AMBULATORY_CARE_PROVIDER_SITE_OTHER): Payer: 59 | Admitting: Psychiatry

## 2015-03-31 ENCOUNTER — Encounter: Payer: Self-pay | Admitting: Psychiatry

## 2015-03-31 DIAGNOSIS — F331 Major depressive disorder, recurrent, moderate: Secondary | ICD-10-CM | POA: Diagnosis not present

## 2015-03-31 DIAGNOSIS — F411 Generalized anxiety disorder: Secondary | ICD-10-CM
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
## 2015-04-20 ENCOUNTER — Encounter: Payer: Self-pay | Admitting: *Deleted

## 2015-04-21 ENCOUNTER — Ambulatory Visit: Payer: Medicare Other | Admitting: Certified Registered"

## 2015-04-21 ENCOUNTER — Encounter
Admission: RE | Disposition: A | Discharge status: Home / Self Care | Payer: Self-pay | Source: Ambulatory Visit | Attending: Unknown Physician Specialty

## 2015-04-21 ENCOUNTER — Encounter: Payer: Self-pay | Admitting: *Deleted

## 2015-04-21 ENCOUNTER — Ambulatory Visit
Admission: RE | Admit: 2015-04-21 | Discharge: 2015-04-21 | Disposition: A | Discharge status: Home / Self Care | Payer: Medicare Other | Source: Ambulatory Visit | Attending: Unknown Physician Specialty | Admitting: Unknown Physician Specialty

## 2015-04-21 DIAGNOSIS — Z882 Allergy status to sulfonamides status: Secondary | ICD-10-CM | POA: Diagnosis not present

## 2015-04-21 DIAGNOSIS — K5730 Diverticulosis of large intestine without perforation or abscess without bleeding: Secondary | ICD-10-CM | POA: Diagnosis not present

## 2015-04-21 DIAGNOSIS — Z91041 Radiographic dye allergy status: Secondary | ICD-10-CM | POA: Diagnosis not present

## 2015-04-21 DIAGNOSIS — K5732 Diverticulitis of large intestine without perforation or abscess without bleeding: Secondary | ICD-10-CM | POA: Diagnosis present

## 2015-04-21 DIAGNOSIS — Z91040 Latex allergy status: Secondary | ICD-10-CM | POA: Diagnosis not present

## 2015-04-21 DIAGNOSIS — Z7951 Long term (current) use of inhaled steroids: Secondary | ICD-10-CM | POA: Insufficient documentation

## 2015-04-21 DIAGNOSIS — Z888 Allergy status to other drugs, medicaments and biological substances status: Secondary | ICD-10-CM | POA: Insufficient documentation

## 2015-04-21 DIAGNOSIS — Z90710 Acquired absence of both cervix and uterus: Secondary | ICD-10-CM | POA: Insufficient documentation

## 2015-04-21 DIAGNOSIS — Z833 Family history of diabetes mellitus: Secondary | ICD-10-CM | POA: Insufficient documentation

## 2015-04-21 DIAGNOSIS — Z79899 Other long term (current) drug therapy: Secondary | ICD-10-CM | POA: Insufficient documentation

## 2015-04-21 DIAGNOSIS — Z8269 Family history of other diseases of the musculoskeletal system and connective tissue: Secondary | ICD-10-CM | POA: Diagnosis not present

## 2015-04-21 DIAGNOSIS — Z818 Family history of other mental and behavioral disorders: Secondary | ICD-10-CM | POA: Diagnosis not present

## 2015-04-21 DIAGNOSIS — Z87891 Personal history of nicotine dependence: Secondary | ICD-10-CM | POA: Insufficient documentation

## 2015-04-21 DIAGNOSIS — F329 Major depressive disorder, single episode, unspecified: Secondary | ICD-10-CM | POA: Diagnosis not present

## 2015-04-21 DIAGNOSIS — Z811 Family history of alcohol abuse and dependence: Secondary | ICD-10-CM | POA: Diagnosis not present

## 2015-04-21 DIAGNOSIS — K769 Liver disease, unspecified: Secondary | ICD-10-CM | POA: Insufficient documentation

## 2015-04-21 DIAGNOSIS — D122 Benign neoplasm of ascending colon: Secondary | ICD-10-CM | POA: Diagnosis not present

## 2015-04-21 DIAGNOSIS — F419 Anxiety disorder, unspecified: Secondary | ICD-10-CM | POA: Insufficient documentation

## 2015-04-21 DIAGNOSIS — G4730 Sleep apnea, unspecified: Secondary | ICD-10-CM | POA: Insufficient documentation

## 2015-04-21 DIAGNOSIS — K635 Polyp of colon: Secondary | ICD-10-CM | POA: Insufficient documentation

## 2015-04-21 DIAGNOSIS — M1990 Unspecified osteoarthritis, unspecified site: Secondary | ICD-10-CM | POA: Diagnosis not present

## 2015-04-21 DIAGNOSIS — Z91048 Other nonmedicinal substance allergy status: Secondary | ICD-10-CM | POA: Insufficient documentation

## 2015-04-21 DIAGNOSIS — Z8249 Family history of ischemic heart disease and other diseases of the circulatory system: Secondary | ICD-10-CM | POA: Insufficient documentation

## 2015-04-21 DIAGNOSIS — Z808 Family history of malignant neoplasm of other organs or systems: Secondary | ICD-10-CM | POA: Diagnosis not present

## 2015-04-21 DIAGNOSIS — J45909 Unspecified asthma, uncomplicated: Secondary | ICD-10-CM | POA: Diagnosis not present

## 2015-04-21 DIAGNOSIS — Z9049 Acquired absence of other specified parts of digestive tract: Secondary | ICD-10-CM | POA: Insufficient documentation

## 2015-04-21 DIAGNOSIS — R935 Abnormal findings on diagnostic imaging of other abdominal regions, including retroperitoneum: Secondary | ICD-10-CM | POA: Diagnosis not present

## 2015-04-21 DIAGNOSIS — Z880 Allergy status to penicillin: Secondary | ICD-10-CM | POA: Insufficient documentation

## 2015-04-21 DIAGNOSIS — K640 First degree hemorrhoids: Secondary | ICD-10-CM | POA: Insufficient documentation

## 2015-04-21 HISTORY — DX: Sleep apnea, unspecified: G47.30

## 2015-04-21 HISTORY — DX: Unspecified osteoarthritis, unspecified site: M19.90

## 2015-04-21 HISTORY — DX: Palpitations: R00.2

## 2015-04-21 HISTORY — DX: Migraine, unspecified, not intractable, without status migrainosus: G43.909

## 2015-04-21 HISTORY — PX: COLONOSCOPY WITH PROPOFOL: SHX5780

## 2015-04-21 SURGERY — COLONOSCOPY WITH PROPOFOL
Anesthesia: General

## 2015-04-21 MED ORDER — KETAMINE HCL 50 MG/ML IJ SOLN
INTRAMUSCULAR | Status: DC | PRN
  Administered 2015-04-21: 20 mg via INTRAMUSCULAR

## 2015-04-21 MED ORDER — SODIUM CHLORIDE 0.9 % IV SOLN: INTRAVENOUS | Status: DC

## 2015-04-21 MED ORDER — PROPOFOL 500 MG/50ML IV EMUL
INTRAVENOUS | Status: DC | PRN
  Administered 2015-04-21: 125 ug/kg/min via INTRAVENOUS

## 2015-04-21 MED ORDER — PROPOFOL 10 MG/ML IV BOLUS
INTRAVENOUS | Status: DC | PRN
  Administered 2015-04-21: 70 mg via INTRAVENOUS

## 2015-04-21 MED ORDER — SODIUM CHLORIDE 0.9 % IV SOLN
INTRAVENOUS | Status: DC
  Administered 2015-04-21: 1000 mL via INTRAVENOUS

## 2015-04-21 MED ORDER — LIDOCAINE HCL (PF) 2 % IJ SOLN
INTRAMUSCULAR | Status: DC | PRN
  Administered 2015-04-21: 80 mg via INTRADERMAL

## 2015-04-21 NOTE — Transfer of Care (Signed)
Immediate Anesthesia Transfer of Care Note  Patient: [name]  Procedure(s) Performed: Procedure(s): COLONOSCOPY WITH PROPOFOL (N/A)  Patient Location: PACU  Anesthesia Type:General  Level of Consciousness: sedated and responds to stimulation  Airway & Oxygen Therapy: Patient Spontanous Breathing and Patient connected to face mask oxygen  Post-op Assessment: Report given to RN and Post -op Vital signs reviewed and stable  Post vital signs: Reviewed and stable  Last Vitals:  Filed Vitals:   04/21/15 0800 04/21/15 0802  BP: 129/82 129/82  Pulse: 66 66  Temp: 36.2 C   Resp: 15 15    Complications: No apparent anesthesia complications

## 2015-04-21 NOTE — Anesthesia Postprocedure Evaluation (Signed)
Anesthesia Post Note  Patient: [name]  Procedure(s) Performed: Procedure(s) (LRB): COLONOSCOPY WITH PROPOFOL (N/A)  Patient location during evaluation: PACU Anesthesia Type: General Level of consciousness: awake Pain management: pain level controlled Vital Signs Assessment: post-procedure vital signs reviewed and stable Respiratory status: spontaneous breathing Cardiovascular status: blood pressure returned to baseline Anesthetic complications: no    Last Vitals:  Filed Vitals:   04/21/15 0802 04/21/15 0810  BP: 129/82 126/79  Pulse: 66 66  Temp:    Resp: 15 17    Last Pain: There were no vitals filed for this visit.               VAN STAVEREN,Ignatius Kloos

## 2015-04-21 NOTE — H&P (Signed)
Primary Care Physician:  Tracie Harrier, MD Primary Gastroenterologist:  Dr. Vira Agar  Pre-Procedure History & Physical: HPI:  [name] is a 55 y.o. female is here for an colonoscopy.   Past Medical History  Diagnosis Date  . Asthma   . Depression   . Anxiety   . Liver disease   . Diverticulitis   . Sleep apnea   . Heart palpitations   . Migraines   . Arthritis     Past Surgical History  Procedure Laterality Date  . Sinusotomy    . Appendectomy    . Cholecystectomy    . Abdominal hysterectomy    . Tonsillectomy      Prior to Admission medications   Medication Sig Start Date End Date Taking? Authorizing Provider  topiramate (TOPAMAX) 200 MG tablet  07/23/14  Yes Historical Provider, MD  busPIRone (BUSPAR) 10 MG tablet Take 2 tablets (20 mg total) by mouth 2 (two) times daily. 02/18/15   Marjie Skiff, MD  cetirizine (ZYRTEC ALLERGY) 10 MG tablet Take by mouth.    Historical Provider, MD  diazepam (VALIUM) 10 MG tablet Take 0.5 tablets (5 mg total) by mouth 2 (two) times daily as needed for anxiety. 02/18/15   Marjie Skiff, MD  escitalopram (LEXAPRO) 10 MG tablet Take one half a tablet in  morning  For seven days then increase to one whole tablet daily. 02/18/15   Marjie Skiff, MD  Fluticasone-Salmeterol (ADVAIR DISKUS) 250-50 MCG/DOSE AEPB Inhale into the lungs.    Historical Provider, MD  ipratropium-albuterol (DUONEB) 0.5-2.5 (3) MG/3ML SOLN Inhale into the lungs. 03/16/14 03/11/15  Historical Provider, MD  mometasone (NASONEX) 50 MCG/ACT nasal spray Place into the nose. 08/23/09   Historical Provider, MD  montelukast (SINGULAIR) 10 MG tablet Take by mouth.    Historical Provider, MD  MULTIPLE VITAMIN PO Take by mouth.    Historical Provider, MD  Olopatadine HCl 0.2 % SOLN Apply to eye. 09/07/08   Historical Provider, MD  omeprazole (PRILOSEC) 20 MG capsule Take by mouth. 02/01/14   Historical Provider, MD  torsemide (DEMADEX) 10 MG tablet Take by mouth.     Historical Provider, MD  Vitamin D, Ergocalciferol, (DRISDOL) 50000 UNITS CAPS capsule  12/13/14   Historical Provider, MD  zolpidem (AMBIEN) 5 MG tablet Take 1 tablet (5 mg total) by mouth at bedtime as needed for sleep. 02/18/15   Marjie Skiff, MD    Allergies as of 04/15/2015 - Review Complete 03/31/2015  Allergen Reaction Noted  . Cefotetan Rash 01/19/2015  . Iodinated diagnostic agents Other (See Comments) and Hives 07/31/2014  . Other Anaphylaxis 07/31/2014  . Promethazine Hives 01/19/2015  . Promethazine hcl Hives 07/31/2014  . Propoxyphene Other (See Comments) 07/31/2014  . Sulfa antibiotics Hives 07/31/2014  . Penicillins Hives 07/31/2014  . Saccharin Hives 02/05/2015  . Aminoglycosides Hives 07/31/2014  . Cefuroxime  07/31/2014  . Latex  07/31/2014    Family History  Problem Relation Age of Onset  . Diabetes Mother   . Muscular dystrophy Mother   . Heart failure Mother   . Anxiety disorder Mother   . Skin cancer Father   . Heart disease Father   . Hypertension Father   . Depression Father   . Alcohol abuse Father   . Drug abuse Father   . Heart disease Sister   . Diabetes Sister   . Anxiety disorder Sister   . Hypertension Brother   . Diverticulosis Brother   . Heart disease  Sister   . Diabetes Sister   . Heart disease Sister   . Diabetes Sister     Social History   Social History  . Marital Status: Married    Spouse Name: N/A  . Number of Children: N/A  . Years of Education: N/A   Occupational History  . Not on file.   Social History Main Topics  . Smoking status: Former Smoker    Types: Cigarettes    Quit date: 08/31/2004  . Smokeless tobacco: Never Used  . Alcohol Use: No  . Drug Use: No  . Sexual Activity: Yes    Birth Control/ Protection: None   Other Topics Concern  . Not on file   Social History Narrative    Review of Systems: See HPI, otherwise negative ROS  Physical Exam: BP 132/91 mmHg  Pulse 65  Temp(Src) 97.6 F  (36.4 C) (Oral)  Resp 16  SpO2 99% General:   Alert,  pleasant and cooperative in NAD Head:  Normocephalic and atraumatic. Neck:  Supple; no masses or thyromegaly. Lungs:  Clear throughout to auscultation.    Heart:  Regular rate and rhythm. Abdomen:  Soft, nontender and nondistended. Normal bowel sounds, without guarding, and without rebound.   Neurologic:  Alert and  oriented x4;  grossly normal neurologically.  Impression/Plan: [name] is here for an colonoscopy to be performed for evaluation after abnormal CT scan  Risks, benefits, limitations, and alternatives regarding  colonoscopy have been reviewed with the patient.  Questions have been answered.  All parties agreeable.   Gaylyn Cheers, MD  04/21/2015, 7:29 AM

## 2015-04-21 NOTE — Op Note (Signed)
[facility] Patient Name: [name] Procedure Date: 04/21/2015 7:34 AM MRN: [id number] Account #: [id number] Date of Birth: Jun 25, 1960 Admit Type: Outpatient Age: 55 Room: [facility] ENDO ROOM 4 Gender: Female Note Status: Finalized Procedure:            Colonoscopy Indications:          Abnormal CT of the GI tract Providers:            Manya Silvas, MD Referring MD:         Tracie Harrier, MD (Referring MD) Medicines:            Propofol per Anesthesia Complications:        No immediate complications. Procedure:            Pre-Anesthesia Assessment:                       - After reviewing the risks and benefits, the patient                        was deemed in satisfactory condition to undergo the                        procedure.                       After obtaining informed consent, the colonoscope was                        passed under direct vision. Throughout the procedure,                        the patient's blood pressure, pulse, and oxygen                        saturations were monitored continuously. The                        Colonoscope was introduced through the anus and                        advanced to the the cecum, identified by appendiceal                        orifice and ileocecal valve. The colonoscopy was                        performed without difficulty. The patient tolerated the                        procedure well. The quality of the bowel preparation                        was excellent. Findings:      Two sessile polyps were found in the sigmoid colon and ascending colon.       The polyps were diminutive in size. These polyps were removed with a       jumbo cold forceps. Resection and retrieval were complete.      Multiple small and large-mouthed diverticula were found in the sigmoid       colon, descending colon, transverse colon and ascending colon.  Internal hemorrhoids were found during endoscopy.  The hemorrhoids were       small and Grade I (internal hemorrhoids that do not prolapse).      The exam was otherwise without abnormality. Impression:           - Two diminutive polyps in the sigmoid colon and in the                        ascending colon, removed with a jumbo cold forceps.                        Resected and retrieved.                       - Diverticulosis in the sigmoid colon, in the                        descending colon, in the transverse colon and in the                        ascending colon.                       - Internal hemorrhoids.                       - The examination was otherwise normal. Recommendation:       - Await pathology results. Manya Silvas, MD 04/21/2015 7:59:04 AM This report has been signed electronically. Number of Addenda: 0 Note Initiated On: 04/21/2015 7:34 AM Scope Withdrawal Time: 0 hours 10 minutes 32 seconds  Total Procedure Duration: 0 hours 17 minutes 16 seconds       [facility]

## 2015-04-21 NOTE — Anesthesia Preprocedure Evaluation (Signed)
Anesthesia Evaluation  Patient identified by MRN, date of birth, ID band Patient awake    Reviewed: Allergy & Precautions, NPO status , Patient's Chart, lab work & pertinent test results  Airway Mallampati: II       Dental no notable dental hx.    Pulmonary asthma , former smoker,     + decreased breath sounds      Cardiovascular negative cardio ROS   Rhythm:Regular Rate:Normal     Neuro/Psych    GI/Hepatic Neg liver ROS, GERD  ,  Endo/Other  Morbid obesity  Renal/GU negative Renal ROS     Musculoskeletal negative musculoskeletal ROS (+)   Abdominal (+) + obese,   Peds negative pediatric ROS (+)  Hematology   Anesthesia Other Findings   Reproductive/Obstetrics                             Anesthesia Physical Anesthesia Plan  ASA: III  Anesthesia Plan: General   Post-op Pain Management:    Induction: Intravenous  Airway Management Planned: Natural Airway and Nasal Cannula  Additional Equipment:   Intra-op Plan:   Post-operative Plan:   Informed Consent: I have reviewed the patients History and Physical, chart, labs and discussed the procedure including the risks, benefits and alternatives for the proposed anesthesia with the patient or authorized representative who has indicated his/her understanding and acceptance.     Plan Discussed with: CRNA  Anesthesia Plan Comments:         Anesthesia Quick Evaluation

## 2015-04-22 LAB — SURGICAL PATHOLOGY

## 2015-05-31 ENCOUNTER — Ambulatory Visit: Payer: 59 | Admitting: Psychiatry

## 2015-06-01 ENCOUNTER — Ambulatory Visit: Payer: 59 | Admitting: Psychiatry

## 2015-06-09 ENCOUNTER — Ambulatory Visit: Payer: 59 | Admitting: Psychiatry

## 2015-07-14 ENCOUNTER — Telehealth: Payer: Self-pay

## 2015-07-14 NOTE — Telephone Encounter (Signed)
PT HAS CANCELED SEVERAL APPT AND DOES HAVE A UP COMING APPT SET UP FOR  07-27-15.  CALLED IN ESCITALOPRAM 10MG  TAKE 1TABLET EVERY DAY. #13.  SHOULD BE ENOUGH MEDICATION TO LAST UNTIL APPT.

## 2015-07-14 NOTE — Telephone Encounter (Signed)
RECEIVED FAX REQUESTING REFILL FOR ESCITALOPRAM 10MG  .

## 2015-07-19 IMAGING — CT CT ABD-PELV W/O CM
1 of 2 series · 15 of 32 positions shown, 19 images · non-contrast
Comparison: none

REASON FOR EXAM: (1) Lower abdominal pain; (2) Lower abdominal pain;
NOTE: Nursing to Give Pilot
COMMENTS:

PROCEDURE:     CT  - CT ABDOMEN AND PELVIS W[DATE]  [DATE]
RESULT:     Comparison: CT of the abdomen 04/23/2012
TECHNIQUE: Multiple axial images from the lung bases to the symphysis pubis
were obtained with oral and without intravenous contrast.

[Series 2: 3mm soft tissue · axial · 0.96mm/px · z∈[-1116,-648]mm · 15 of 172 slices shown, 19 images]
[im 8/172  soft-tissue]
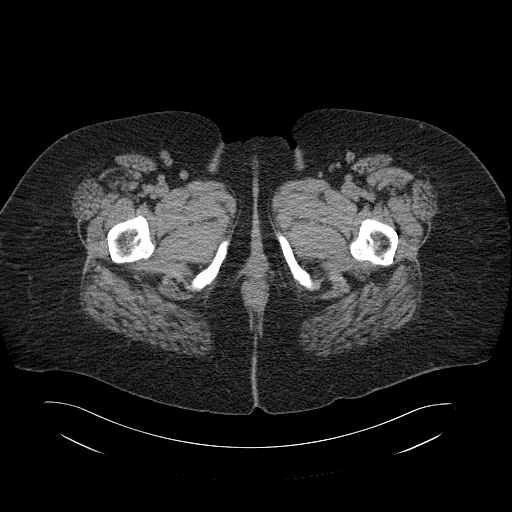
[im 8/172  bone]
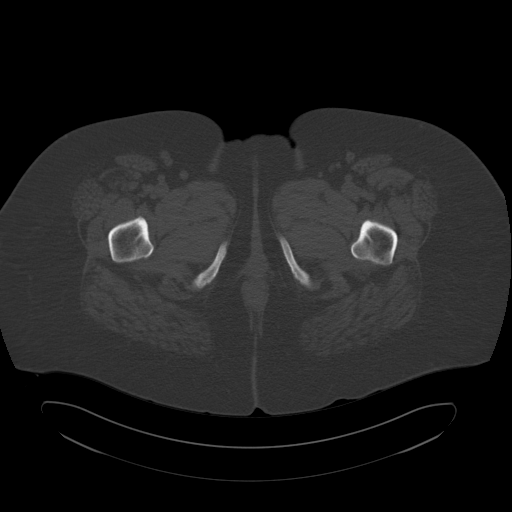
[im 23/172  soft-tissue]
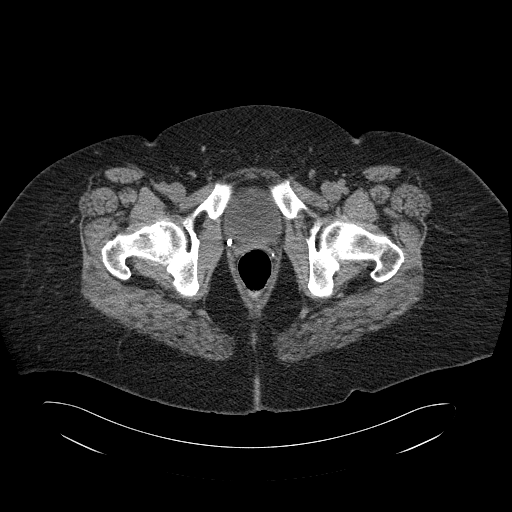
[im 38/172  soft-tissue]
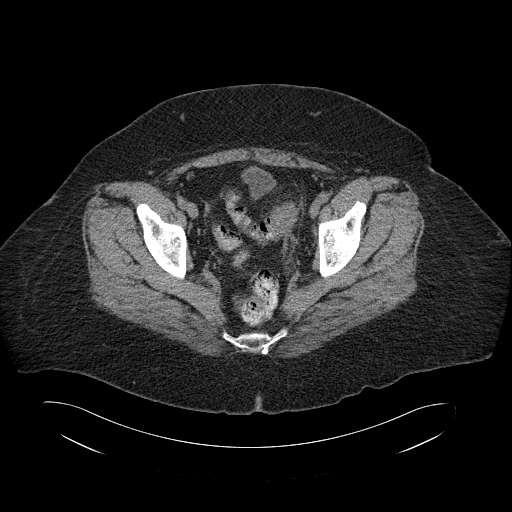
[im 45/172  soft-tissue]
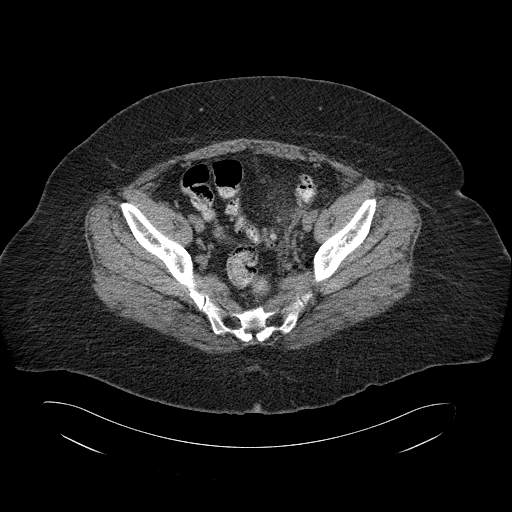
[im 60/172  soft-tissue]
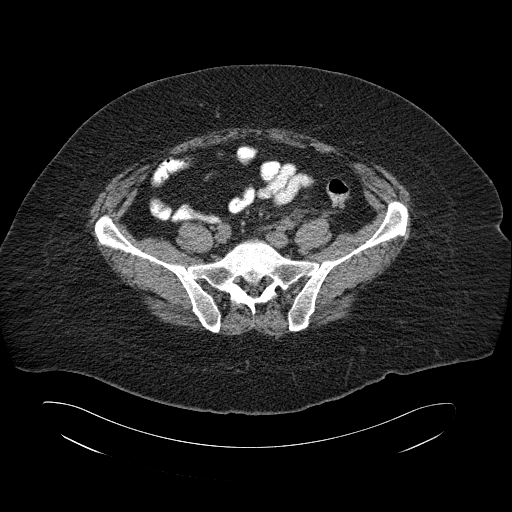
[im 75/172  soft-tissue]
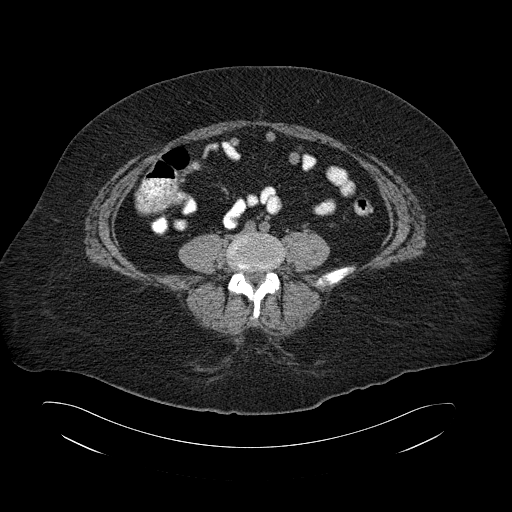
[im 90/172  soft-tissue]
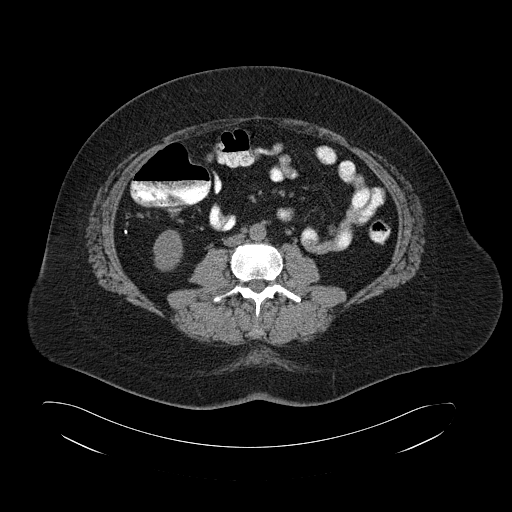
[im 97/172  soft-tissue]
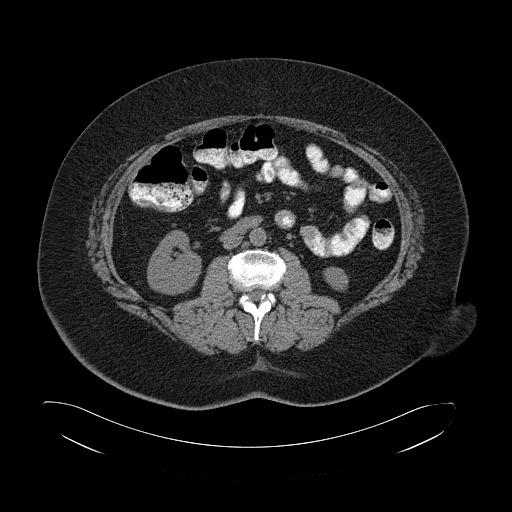
[im 112/172  soft-tissue]
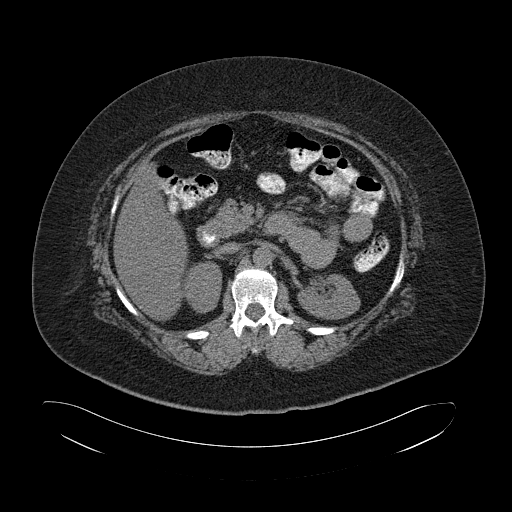
[im 112/172  bone]
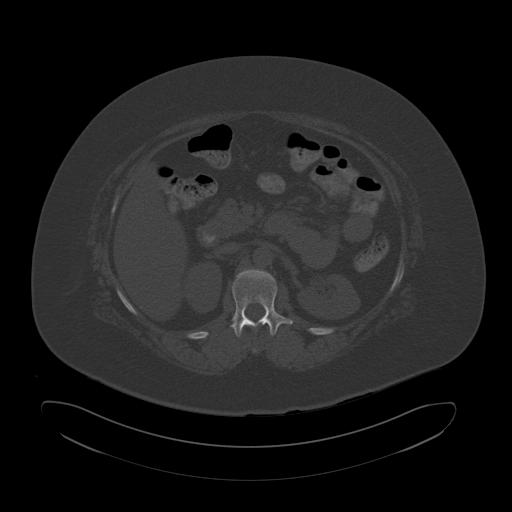
[im 127/172  soft-tissue]
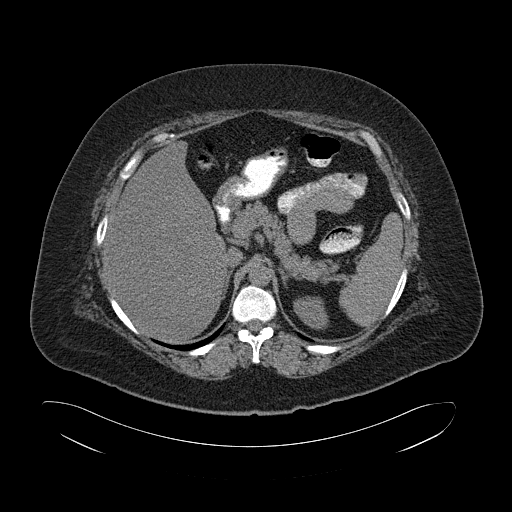
[im 134/172  soft-tissue]
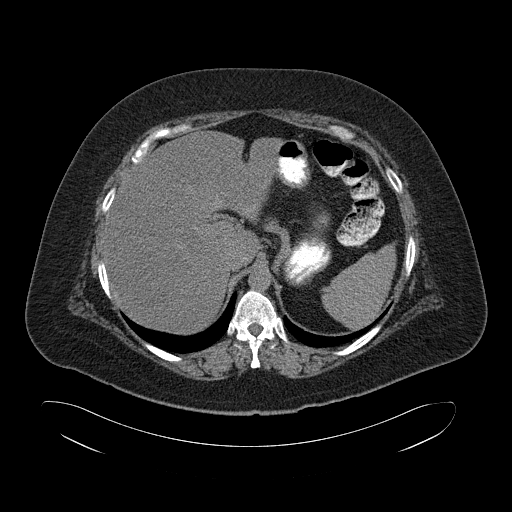
[im 142/172  lung]
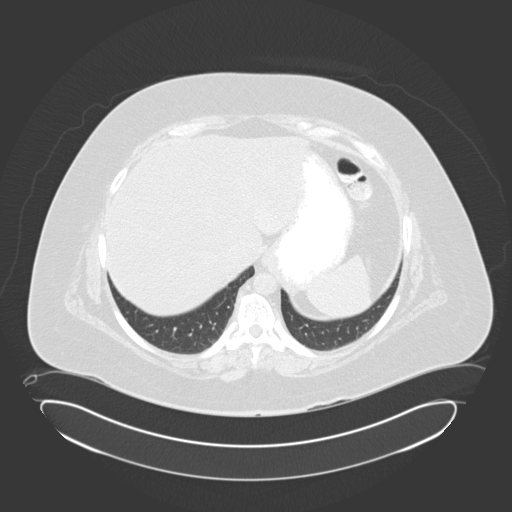
[im 149/172  soft-tissue]
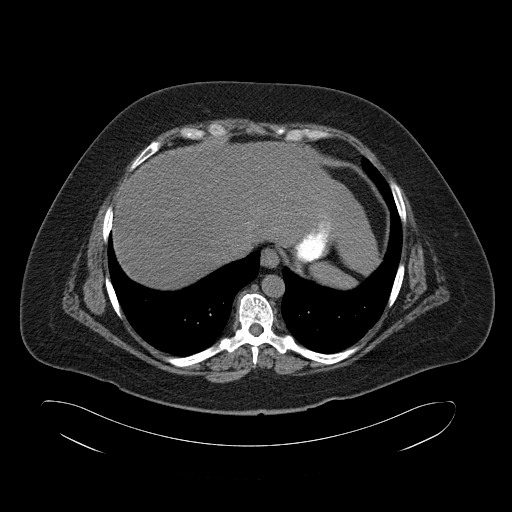
[im 149/172  lung]
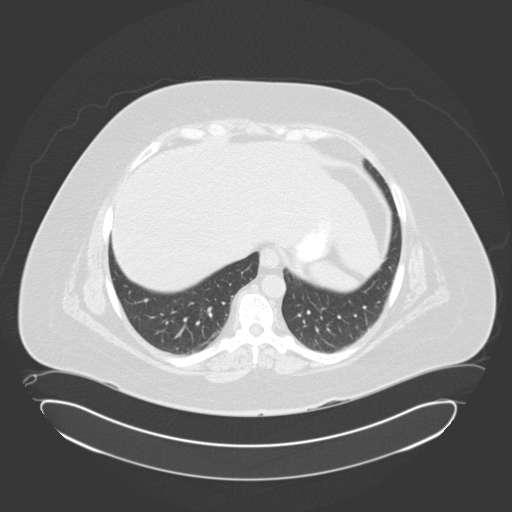
[im 157/172  lung]
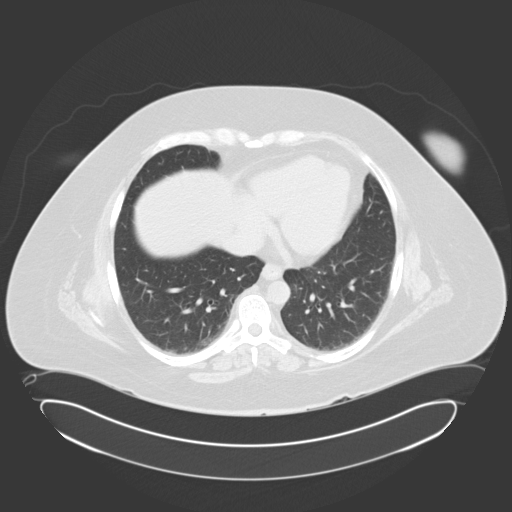
[im 164/172  soft-tissue]
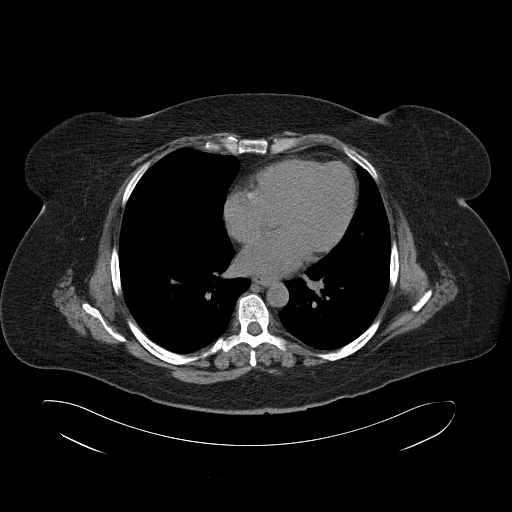
[im 164/172  lung]
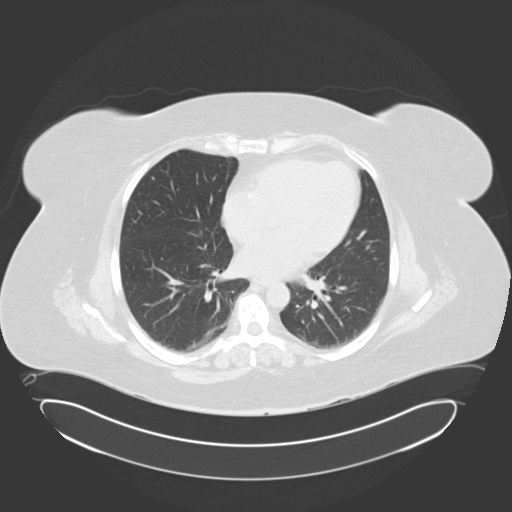

[15 of 32 positions shown; findings below may reference images not displayed]

FINDINGS: Mild basilar opacities are likely secondary to atelectasis.

Lack of intravenous contrast limits evaluation of the solid abdominal
organs.  The liver is somewhat low in attenuation, raising the possibility
of hepatic steatosis. Slight increased attenuation along the posterior
margin of the left hepatic lobe likely represents focal fatty sparing.
Surgical clips are seen from prior cholecystectomy. The spleen, adrenals,
and pancreas are unremarkable. No renal calculi or hydronephrosis.

There is diverticulosis of the sigmoid colon. There is bowel wall thickening
and mild adjacent inflammatory stranding involving the sigmoid colon. No
discrete extraluminal fluid collection identified. There are a few
diverticula in the descending colon. Surgical clips are seen at the base of
the cecum, likely due to prior appendectomy. The patient is status post
hysterectomy.

No aggressive lytic or sclerotic osseous lesions are identified.
IMPRESSION: Findings which likely represent acute sigmoid diverticulitis. No discrete
extraluminal fluid collection. Colonoscopy is recommended after the acute
episode, as malignancy can have a similar appearance.

[REDACTED]

## 2015-07-22 ENCOUNTER — Ambulatory Visit: Payer: 59 | Admitting: Psychiatry

## 2015-07-27 ENCOUNTER — Ambulatory Visit (INDEPENDENT_AMBULATORY_CARE_PROVIDER_SITE_OTHER): Payer: 59 | Admitting: Psychiatry

## 2015-07-27 DIAGNOSIS — F4310 Post-traumatic stress disorder, unspecified: Secondary | ICD-10-CM

## 2015-07-27 DIAGNOSIS — F331 Major depressive disorder, recurrent, moderate: Secondary | ICD-10-CM | POA: Diagnosis not present

## 2015-07-27 MED ORDER — ESCITALOPRAM OXALATE 10 MG PO TABS: ORAL_TABLET | ORAL | Status: DC

## 2015-07-27 MED ORDER — TRAZODONE HCL 100 MG PO TABS: 100.0000 mg | ORAL_TABLET | Freq: Every day | ORAL | Status: DC

## 2015-07-27 MED ORDER — BUSPIRONE HCL 10 MG PO TABS: 20.0000 mg | ORAL_TABLET | Freq: Two times a day (BID) | ORAL | Status: DC

## 2015-07-27 NOTE — Progress Notes (Signed)
Patient ID: [name], female   DOB: [birth year], 55 y.o.   MRN: [id number] [facility] MD/PA/NP OP Progress Note  07/27/2015 10:18 AM [name]  MRN:  [id number]  Subjective:  Patient returns for follow-up of her major depressive disorder, generalized anxiety disorder and grief. Reports worrying all the time. Not sleeping well. States she is depressed and has relationship issues with several family members. States she has a lot of anxiety, cannot go to grocery stores and difficulty in crowds.Denies suicidal thoughts, though she feels she is better off dead than alive. Denies any active thoughts or plans. States that she would like to feel better and normal. She has been seeing a therapist weekly. Patient continues to report poor sleep.  Chief Complaint: Depressed  Visit Diagnosis:     ICD-9-CM ICD-10-CM   1. Major depressive disorder, recurrent episode, moderate (HCC) 296.32 F33.1   2. Post traumatic stress disorder (PTSD) 309.81 F43.10     Past Medical History:  Past Medical History  Diagnosis Date  . Asthma   . Depression   . Anxiety   . Liver disease   . Diverticulitis   . Sleep apnea   . Heart palpitations   . Migraines   . Arthritis     Past Surgical History  Procedure Laterality Date  . Sinusotomy    . Appendectomy    . Cholecystectomy    . Abdominal hysterectomy    . Tonsillectomy    . Colonoscopy with propofol N/A 04/21/2015    Procedure: COLONOSCOPY WITH PROPOFOL;  Surgeon: Manya Silvas, MD;  Location: [facility] ENDOSCOPY;  Service: Endoscopy;  Laterality: N/A;   Family History:  Family History  Problem Relation Age of Onset  . Diabetes Mother   . Muscular dystrophy Mother   . Heart failure Mother   . Anxiety disorder Mother   . Skin cancer Father   . Heart disease Father   . Hypertension Father   . Depression Father   . Alcohol abuse Father   . Drug abuse Father   . Heart disease Sister   . Diabetes Sister   . Anxiety disorder Sister   . Hypertension  Brother   . Diverticulosis Brother   . Heart disease Sister   . Diabetes Sister   . Heart disease Sister   . Diabetes Sister    Social History:  Social History   Social History  . Marital Status: Married    Spouse Name: N/A  . Number of Children: N/A  . Years of Education: N/A   Social History Main Topics  . Smoking status: Former Smoker    Types: Cigarettes    Quit date: 08/31/2004  . Smokeless tobacco: Never Used  . Alcohol Use: No  . Drug Use: No  . Sexual Activity: Yes    Birth Control/ Protection: None   Other Topics Concern  . Not on file   Social History Narrative   Additional History:   Assessment:   Musculoskeletal: Strength & Muscle Tone: within normal limits Gait & Station: normal Patient leans: N/A  Psychiatric Specialty Exam: Anxiety Symptoms include nervous/anxious behavior. Patient reports no insomnia or suicidal ideas.    Depression        Associated symptoms include does not have insomnia and no suicidal ideas.  Past medical history includes anxiety.     Review of Systems  Psychiatric/Behavioral: Positive for depression. Negative for suicidal ideas, hallucinations, memory loss and substance abuse. The patient is nervous/anxious. The patient does not have insomnia.  All other systems reviewed and are negative.   There were no vitals taken for this visit.There is no weight on file to calculate BMI.  General Appearance: Neat and Well Groomed  Eye Contact:  Good  Speech:  Normal Rate  Volume:  Normal  Mood:  depressed  Affect:  Constricted, tearful  Thought Process:  Linear and Logical  Orientation:  Full (Time, Place, and Person)  Thought Content:  Negative  Suicidal Thoughts:  No  Homicidal Thoughts:  No  Memory:  Immediate;   Good Recent;   Good Remote;   Good  Judgement:  Good  Insight:  Good  Psychomotor Activity:  Negative  Concentration:  Good  Recall:  Good  Fund of Knowledge: Negative  Language: Good  Akathisia:   Negative  Handed:  Right  AIMS (if indicated):  Not done today.  Assets:  Communication Skills Desire for Improvement Social Support  ADL's:  Intact  Cognition: WNL  Sleep:  poor   Is the patient at risk to self?  No. Has the patient been a risk to self in the past 6 months?  No. Has the patient been a risk to self within the distant past?  No. Is the patient a risk to others?  No. Has the patient been a risk to others in the past 6 months?  No. Has the patient been a risk to others within the distant past?  No.  Current Medications: Current Outpatient Prescriptions  Medication Sig Dispense Refill  . busPIRone (BUSPAR) 10 MG tablet Take 2 tablets (20 mg total) by mouth 2 (two) times daily. 120 tablet 3  . cetirizine (ZYRTEC ALLERGY) 10 MG tablet Take by mouth.    . diazepam (VALIUM) 10 MG tablet Take 0.5 tablets (5 mg total) by mouth 2 (two) times daily as needed for anxiety. 30 tablet 3  . escitalopram (LEXAPRO) 10 MG tablet Take one half a tablet in  morning  For seven days then increase to one whole tablet daily. 30 tablet 3  . Fluticasone-Salmeterol (ADVAIR DISKUS) 250-50 MCG/DOSE AEPB Inhale into the lungs.    Marland Kitchen ipratropium-albuterol (DUONEB) 0.5-2.5 (3) MG/3ML SOLN Inhale into the lungs.    . mometasone (NASONEX) 50 MCG/ACT nasal spray Place into the nose.    . montelukast (SINGULAIR) 10 MG tablet Take by mouth.    . MULTIPLE VITAMIN PO Take by mouth.    . Olopatadine HCl 0.2 % SOLN Apply to eye.    Marland Kitchen omeprazole (PRILOSEC) 20 MG capsule Take by mouth.    . topiramate (TOPAMAX) 200 MG tablet     . torsemide (DEMADEX) 10 MG tablet Take by mouth.    . Vitamin D, Ergocalciferol, (DRISDOL) 50000 UNITS CAPS capsule     . zolpidem (AMBIEN) 5 MG tablet Take 1 tablet (5 mg total) by mouth at bedtime as needed for sleep. 30 tablet 3   No current facility-administered medications for this visit.    Medical Decision Making:  Established Problem, Stable/Improving (1), Review of  Medication Regimen & Side Effects (2) and Review of New Medication or Change in Dosage (2)  Treatment Plan Summary:Medication management and Plan   Major depressive disorder, recurrent, moderate- Increase lexapro to 15mg  po qd. Continue therapy with Miguel Dibble.  PTSD-Lexapro as above.   Generalized anxiety disorder-continue BuSpar Discussed tapering off the Valium with patient and she reports that she takes it occasionally as needed and still has some medications left from Dr. Jimmye Norman prescriptions.  Insomnia- discontinue Ambien, start trazodone  at 100 mg at bedtime. Patient feels groggy she can cut it down to 50 mg   She is to return in the in 2 months for a follow-up appointment and to call before if needed.  Graydon Fofana 07/27/2015, 10:18 AM

## 2015-09-07 ENCOUNTER — Encounter: Payer: Self-pay | Admitting: Psychiatry

## 2015-09-07 ENCOUNTER — Ambulatory Visit (INDEPENDENT_AMBULATORY_CARE_PROVIDER_SITE_OTHER): Payer: 59 | Admitting: Psychiatry

## 2015-09-07 VITALS — BP 137/77 | HR 70 | Temp 98.4°F | Ht 65.0 in | Wt 281.8 lb

## 2015-09-07 DIAGNOSIS — F4310 Post-traumatic stress disorder, unspecified: Secondary | ICD-10-CM

## 2015-09-07 DIAGNOSIS — F331 Major depressive disorder, recurrent, moderate: Secondary | ICD-10-CM

## 2015-09-07 MED ORDER — TRAZODONE HCL 100 MG PO TABS: 100.0000 mg | ORAL_TABLET | Freq: Every day | ORAL | 1 refills | Status: DC

## 2015-09-07 MED ORDER — BUSPIRONE HCL 10 MG PO TABS: 20.0000 mg | ORAL_TABLET | Freq: Two times a day (BID) | ORAL | 3 refills | Status: DC

## 2015-09-07 MED ORDER — ESCITALOPRAM OXALATE 10 MG PO TABS: ORAL_TABLET | ORAL | 2 refills | Status: DC

## 2015-09-07 NOTE — Progress Notes (Signed)
Patient ID: [name], female   DOB: 02-23-1960, 55 y.o.   MRN: [id number]   [facility] MD/PA/NP OP Progress Note  09/07/2015 10:18 AM [name]  MRN:  [id number]  Subjective:  Patient returns for follow-up of her major depressive disorder, generalized anxiety disorder and grief. Patient reports her anxiety and mood has gotten a little better. She is not as anxious to go to grocery stores. She is sleeping better since starting the trazodone. States her blood pressure is up and she has been feeling cold and clammy .Denies suicidal thoughts.  Chief Complaint: Depressed Chief Complaint    Follow-up; Medication Refill; Medication Problem     Visit Diagnosis:   No diagnosis found.  Past Medical History:  Past Medical History:  Diagnosis Date  . Anxiety   . Arthritis   . Asthma   . Depression   . Diverticulitis   . Heart palpitations   . Liver disease   . Migraines   . Sleep apnea     Past Surgical History:  Procedure Laterality Date  . ABDOMINAL HYSTERECTOMY    . APPENDECTOMY    . CHOLECYSTECTOMY    . COLONOSCOPY WITH PROPOFOL N/A 04/21/2015   Procedure: COLONOSCOPY WITH PROPOFOL;  Surgeon: Manya Silvas, MD;  Location[facility] ENDOSCOPY;  Service: Endoscopy;  Laterality: N/A;  . SINUSOTOMY    . TONSILLECTOMY     Family History:  Family History  Problem Relation Age of Onset  . Diabetes Mother   . Muscular dystrophy Mother   . Heart failure Mother   . Anxiety disorder Mother   . Skin cancer Father   . Heart disease Father   . Hypertension Father   . Depression Father   . Alcohol abuse Father   . Drug abuse Father   . Heart disease Sister   . Diabetes Sister   . Anxiety disorder Sister   . Hypertension Brother   . Diverticulosis Brother   . Heart disease Sister   . Diabetes Sister   . Heart disease Sister   . Diabetes Sister    Social History:  Social History   Social History  . Marital status: Married    Spouse name: N/A  . Number of children: N/A  .  Years of education: N/A   Social History Main Topics  . Smoking status: Former Smoker    Types: Cigarettes    Quit date: 08/31/2004  . Smokeless tobacco: Never Used  . Alcohol use No  . Drug use: No  . Sexual activity: Yes    Birth control/ protection: None   Other Topics Concern  . None   Social History Narrative  . None   Additional History:   Assessment:   Musculoskeletal: Strength & Muscle Tone: within normal limits Gait & Station: normal Patient leans: N/A  Psychiatric Specialty Exam: Anxiety  Symptoms include nervous/anxious behavior. Patient reports no insomnia or suicidal ideas.    Depression         Associated symptoms include does not have insomnia and no suicidal ideas.  Past medical history includes anxiety.   Medication Refill     Review of Systems  Psychiatric/Behavioral: Positive for depression. Negative for hallucinations, memory loss, substance abuse and suicidal ideas. The patient is nervous/anxious. The patient does not have insomnia.   All other systems reviewed and are negative.   Blood pressure 137/77, pulse 70, temperature 98.4 F (36.9 C), temperature source Oral, height 5\' 5"  (1.651 m), weight 281 lb 12.8 oz (127.8 kg).Body  mass index is 46.89 kg/m.  General Appearance: Neat and Well Groomed  Eye Contact:  Good  Speech:  Normal Rate  Volume:  Normal  Mood:  depressed  Affect:  Constricted  Thought Process:  Linear and Logical  Orientation:  Full (Time, Place, and Person)  Thought Content:  Negative  Suicidal Thoughts:  No  Homicidal Thoughts:  No  Memory:  Immediate;   Good Recent;   Good Remote;   Good  Judgement:  Good  Insight:  Good  Psychomotor Activity:  Negative  Concentration:  Good  Recall:  Good  Fund of Knowledge: Negative  Language: Good  Akathisia:  Negative  Handed:  Right  AIMS (if indicated):  Not done today.  Assets:  Communication Skills Desire for Improvement Social Support  ADL's:  Intact  Cognition:  WNL  Sleep:  poor   Is the patient at risk to self?  No. Has the patient been a risk to self in the past 6 months?  No. Has the patient been a risk to self within the distant past?  No. Is the patient a risk to others?  No. Has the patient been a risk to others in the past 6 months?  No. Has the patient been a risk to others within the distant past?  No.  Current Medications: Current Outpatient Prescriptions  Medication Sig Dispense Refill  . busPIRone (BUSPAR) 10 MG tablet Take 2 tablets (20 mg total) by mouth 2 (two) times daily. 120 tablet 3  . cetirizine (ZYRTEC ALLERGY) 10 MG tablet Take by mouth.    . diazepam (VALIUM) 10 MG tablet Take 0.5 tablets (5 mg total) by mouth 2 (two) times daily as needed for anxiety. 30 tablet 3  . escitalopram (LEXAPRO) 10 MG tablet Take one and half tablets daily. 45 tablet 2  . Fluticasone-Salmeterol (ADVAIR DISKUS) 250-50 MCG/DOSE AEPB Inhale into the lungs.    . Fluticasone-Salmeterol (ADVAIR DISKUS) 250-50 MCG/DOSE AEPB Inhale into the lungs.    . mometasone (NASONEX) 50 MCG/ACT nasal spray Place into the nose.    . mometasone (NASONEX) 50 MCG/ACT nasal spray Place into the nose.    . montelukast (SINGULAIR) 10 MG tablet Take by mouth.    . MULTIPLE VITAMIN PO Take by mouth.    . Olopatadine HCl 0.2 % SOLN Apply to eye.    Marland Kitchen Olopatadine HCl 0.2 % SOLN Apply to eye.    Marland Kitchen omeprazole (PRILOSEC) 20 MG capsule Take by mouth.    . topiramate (TOPAMAX) 200 MG tablet     . traZODone (DESYREL) 100 MG tablet Take 1 tablet (100 mg total) by mouth at bedtime. 30 tablet 1  . Vitamin D, Ergocalciferol, (DRISDOL) 50000 UNITS CAPS capsule     . Vitamin D, Ergocalciferol, (DRISDOL) 50000 units CAPS capsule TAKE 1 CAPSULE BY MOUTH 1 TIME A WEEK    . ipratropium-albuterol (DUONEB) 0.5-2.5 (3) MG/3ML SOLN Inhale into the lungs.     No current facility-administered medications for this visit.     Medical Decision Making:  Established Problem, Stable/Improving  (1), Review of Medication Regimen & Side Effects (2) and Review of New Medication or Change in Dosage (2)  Treatment Plan Summary:Medication management and Plan   Major depressive disorder, recurrent, moderate- Continue lexapro at 15mg  po qd. Continue therapy with Miguel Dibble.  PTSD-Lexapro as above.   Generalized anxiety disorder-continue BuSpar  Discussed tapering off the Valium with patient and she reports that she takes it occasionally as needed and still has  some medications left from Dr. Jimmye Norman prescriptions.  Insomnia-  Continue trazodone at 100 mg at bedtime.    She is to return in the in 2 months for a follow-up appointment and to call before if needed.  [name] 09/07/2015, 10:18 AM

## 2015-10-21 DIAGNOSIS — I10 Essential (primary) hypertension: Secondary | ICD-10-CM | POA: Insufficient documentation

## 2015-11-11 ENCOUNTER — Encounter: Payer: Self-pay | Admitting: Psychiatry

## 2015-11-11 ENCOUNTER — Ambulatory Visit (INDEPENDENT_AMBULATORY_CARE_PROVIDER_SITE_OTHER): Payer: 59 | Admitting: Psychiatry

## 2015-11-11 VITALS — BP 136/84 | HR 66 | Temp 97.7°F | Wt 284.6 lb

## 2015-11-11 DIAGNOSIS — F411 Generalized anxiety disorder: Secondary | ICD-10-CM | POA: Diagnosis not present

## 2015-11-11 DIAGNOSIS — F331 Major depressive disorder, recurrent, moderate: Secondary | ICD-10-CM

## 2015-11-11 DIAGNOSIS — F4310 Post-traumatic stress disorder, unspecified: Secondary | ICD-10-CM

## 2015-11-11 MED ORDER — ESCITALOPRAM OXALATE 20 MG PO TABS: ORAL_TABLET | ORAL | 1 refills | Status: DC

## 2015-11-11 MED ORDER — TRAZODONE HCL 100 MG PO TABS: 100.0000 mg | ORAL_TABLET | Freq: Every day | ORAL | 1 refills | Status: DC
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
## 2015-12-06 ENCOUNTER — Ambulatory Visit: Payer: Self-pay | Admitting: Psychiatry

## 2015-12-15 ENCOUNTER — Ambulatory Visit: Payer: 59 | Admitting: Psychiatry

## 2015-12-23 ENCOUNTER — Ambulatory Visit (INDEPENDENT_AMBULATORY_CARE_PROVIDER_SITE_OTHER): Payer: 59 | Admitting: Psychiatry

## 2015-12-23 ENCOUNTER — Encounter: Payer: Self-pay | Admitting: Psychiatry

## 2015-12-23 VITALS — BP 132/85 | HR 69 | Temp 98.4°F | Wt 287.4 lb

## 2015-12-23 DIAGNOSIS — F331 Major depressive disorder, recurrent, moderate: Secondary | ICD-10-CM | POA: Diagnosis not present

## 2015-12-23 DIAGNOSIS — F4310 Post-traumatic stress disorder, unspecified: Secondary | ICD-10-CM

## 2015-12-23 DIAGNOSIS — F411 Generalized anxiety disorder: Secondary | ICD-10-CM

## 2015-12-23 MED ORDER — TRAZODONE HCL 100 MG PO TABS: 100.0000 mg | ORAL_TABLET | Freq: Every day | ORAL | 1 refills | Status: DC

## 2015-12-23 MED ORDER — BUSPIRONE HCL 10 MG PO TABS: 10.0000 mg | ORAL_TABLET | Freq: Two times a day (BID) | ORAL | 2 refills | Status: DC

## 2015-12-23 MED ORDER — ESCITALOPRAM OXALATE 10 MG PO TABS: ORAL_TABLET | ORAL | 1 refills | Status: DC

## 2015-12-23 NOTE — Progress Notes (Signed)
Patient ID: [name], female   DOB: August 17, 1960, 55 y.o.   MRN: [id number]   [facility] MD/PA/NP OP Progress Note  12/23/2015 11:04 AM TIAHNA SHIERS  MRN:  [id number]  Subjective:  Patient returns for follow-up of her major depressive disorder, generalized anxiety disorder and grief. Patient reports that she was unable to tolerate the Lexapro at 20 mg and it increased her blood pressure. She cut it back down to 10 mg. Continues to feel more anxious since stopping the BuSpar. States that she did not realize it was helping her. She is okay with starting back the BuSpar at a lower dose. Reports some vague suicidal thoughts but has not acted on any and has not cut on herself. She is seeing Otila Kluver every 3 weeks and states it's quite helpful.    Chief Complaint: better Chief Complaint    Follow-up; Medication Refill     Visit Diagnosis:     ICD-9-CM ICD-10-CM   1. Post traumatic stress disorder (PTSD) 309.81 F43.10   2. GAD (generalized anxiety disorder) 300.02 F41.1   3. MDD (major depressive disorder), recurrent episode, moderate (HCC) 296.32 F33.1     Past Medical History:  Past Medical History:  Diagnosis Date  . Anxiety   . Arthritis   . Asthma   . Depression   . Diverticulitis   . Heart palpitations   . Liver disease   . Migraines   . Sleep apnea     Past Surgical History:  Procedure Laterality Date  . ABDOMINAL HYSTERECTOMY    . APPENDECTOMY    . CHOLECYSTECTOMY    . COLONOSCOPY WITH PROPOFOL N/A 04/21/2015   Procedure: COLONOSCOPY WITH PROPOFOL;  Surgeon: Manya Silvas, MD;  Location: [facility] ENDOSCOPY;  Service: Endoscopy;  Laterality: N/A;  . SINUSOTOMY    . TONSILLECTOMY     Family History:  Family History  Problem Relation Age of Onset  . Diabetes Mother   . Muscular dystrophy Mother   . Heart failure Mother   . Anxiety disorder Mother   . Skin cancer Father   . Heart disease Father   . Hypertension Father   . Depression Father   . Alcohol abuse Father   . Drug  abuse Father   . Heart disease Sister   . Diabetes Sister   . Anxiety disorder Sister   . Hypertension Brother   . Diverticulosis Brother   . Heart disease Sister   . Diabetes Sister   . Heart disease Sister   . Diabetes Sister    Social History:  Social History   Social History  . Marital status: Married    Spouse name: N/A  . Number of children: N/A  . Years of education: N/A   Social History Main Topics  . Smoking status: Former Smoker    Types: Cigarettes    Quit date: 08/31/2004  . Smokeless tobacco: Never Used  . Alcohol use No  . Drug use: No  . Sexual activity: Yes    Birth control/ protection: None   Other Topics Concern  . None   Social History Narrative  . None   Additional History:   Assessment:   Musculoskeletal: Strength & Muscle Tone: within normal limits Gait & Station: normal Patient leans: N/A  Psychiatric Specialty Exam: Medication Refill   Anxiety  Symptoms include nervous/anxious behavior. Patient reports no insomnia or suicidal ideas.    Depression         Associated symptoms include does not have insomnia and  no suicidal ideas.  Past medical history includes anxiety.     Review of Systems  Psychiatric/Behavioral: Positive for depression. Negative for hallucinations, memory loss, substance abuse and suicidal ideas. The patient is nervous/anxious. The patient does not have insomnia.   All other systems reviewed and are negative.   Blood pressure 132/85, pulse 69, temperature 98.4 F (36.9 C), temperature source Oral, weight 287 lb 6.4 oz (130.4 kg).Body mass index is 47.83 kg/m.  General Appearance: Neat and Well Groomed  Eye Contact:  Good  Speech:  Normal Rate  Volume:  Normal  Mood:  Has had some good days  Affect:  Constricted  Thought Process:  Linear and Logical  Orientation:  Full (Time, Place, and Person)  Thought Content:  Negative  Suicidal Thoughts:  No  Homicidal Thoughts:  No  Memory:  Immediate;   Good Recent;    Good Remote;   Good  Judgement:  Good  Insight:  Good  Psychomotor Activity:  Negative  Concentration:  Good  Recall:  Good  Fund of Knowledge: Negative  Language: Good  Akathisia:  Negative  Handed:  Right  AIMS (if indicated):  Not done today.  Assets:  Communication Skills Desire for Improvement Social Support  ADL's:  Intact  Cognition: WNL  Sleep:  better   Is the patient at risk to self?  No. Has the patient been a risk to self in the past 6 months?  No. Has the patient been a risk to self within the distant past?  No. Is the patient a risk to others?  No. Has the patient been a risk to others in the past 6 months?  No. Has the patient been a risk to others within the distant past?  No.  Current Medications: Current Outpatient Prescriptions  Medication Sig Dispense Refill  . amLODipine (NORVASC) 5 MG tablet Take 5 mg by mouth 1 day or 1 dose.    . busPIRone (BUSPAR) 10 MG tablet Take 2 tablets (20 mg total) by mouth 2 (two) times daily. 120 tablet 3  . cetirizine (ZYRTEC ALLERGY) 10 MG tablet Take by mouth.    . diazepam (VALIUM) 10 MG tablet Take 0.5 tablets (5 mg total) by mouth 2 (two) times daily as needed for anxiety. 30 tablet 3  . escitalopram (LEXAPRO) 20 MG tablet Take 1 tablet daily 30 tablet 1  . FINACEA 15 % cream     . Fluticasone-Salmeterol (ADVAIR DISKUS) 250-50 MCG/DOSE AEPB Inhale into the lungs.    . mometasone (NASONEX) 50 MCG/ACT nasal spray Place into the nose.    . montelukast (SINGULAIR) 10 MG tablet Take by mouth.    . MULTIPLE VITAMIN PO Take by mouth.    . Olopatadine HCl 0.2 % SOLN Apply to eye.    Marland Kitchen omeprazole (PRILOSEC) 20 MG capsule Take by mouth.    . SOOLANTRA 1 % CREA     . topiramate (TOPAMAX) 200 MG tablet     . traZODone (DESYREL) 100 MG tablet Take 1 tablet (100 mg total) by mouth at bedtime. 30 tablet 1  . Vitamin D, Ergocalciferol, (DRISDOL) 50000 units CAPS capsule TAKE 1 CAPSULE BY MOUTH 1 TIME A WEEK    .  ipratropium-albuterol (DUONEB) 0.5-2.5 (3) MG/3ML SOLN Inhale into the lungs.     No current facility-administered medications for this visit.     Medical Decision Making:  Established Problem, Stable/Improving (1), Review of Medication Regimen & Side Effects (2) and Review of New Medication or Change in  Dosage (2)  Treatment Plan Summary:Medication management and Plan   Major depressive disorder, recurrent, moderate- Continue Lexapro at 10mg  po qd. Continue therapy with Miguel Dibble, encouraged patient to see Otila Kluver on a weekly basis.  PTSD-Lexapro as above.   Generalized anxiety disorder- Restart buspar at 10mg  po bid.  Insomnia-  Continue trazodone at 100 mg at bedtime.    She is to return in the in 2 months for a follow-up appointment and to call before if needed.  Aviv Lengacher 12/23/2015, 11:04 AM

## 2016-02-24 ENCOUNTER — Ambulatory Visit (INDEPENDENT_AMBULATORY_CARE_PROVIDER_SITE_OTHER): Payer: 59 | Admitting: Psychiatry

## 2016-02-24 ENCOUNTER — Encounter: Payer: Self-pay | Admitting: Psychiatry

## 2016-02-24 ENCOUNTER — Telehealth: Payer: Self-pay

## 2016-02-24 VITALS — BP 133/84 | HR 74 | Temp 98.7°F | Wt 288.2 lb

## 2016-02-24 DIAGNOSIS — F411 Generalized anxiety disorder: Secondary | ICD-10-CM

## 2016-02-24 DIAGNOSIS — F331 Major depressive disorder, recurrent, moderate: Secondary | ICD-10-CM | POA: Diagnosis not present

## 2016-02-24 MED ORDER — TRAZODONE HCL 100 MG PO TABS: 100.0000 mg | ORAL_TABLET | Freq: Every day | ORAL | 1 refills | Status: DC

## 2016-02-24 MED ORDER — BUSPIRONE HCL 15 MG PO TABS: 15.0000 mg | ORAL_TABLET | Freq: Two times a day (BID) | ORAL | 1 refills | Status: DC

## 2016-02-24 MED ORDER — ESCITALOPRAM OXALATE 10 MG PO TABS: ORAL_TABLET | ORAL | 1 refills | Status: DC

## 2016-02-24 NOTE — Progress Notes (Signed)
Patient ID: [name], female   DOB: [birth year], 56 y.o.   MRN: [id number]   [facility] MD/PA/NP OP Progress Note  02/24/2016 10:31 AM KOSISOCHUKWU SANDFORD  MRN:  [id number]  Subjective:  Patient returns for follow-up of her major depressive disorder, generalized anxiety disorder and grief. Patient reports that starting back on the BuSpar has been mildly effective. Continues to report that she has some good days and bad days. States that there is a lot of personal drama in her family and that has been stressful. She continues to see Otila Kluver once every 3 weeks. States that her therapy is walking her dogs every day. She has days when she sleeps well and days when she stays awake. She reports some vague suicidal thoughts with no plan.  Chief Complaint: Doing okay Chief Complaint    Follow-up; Medication Refill     Visit Diagnosis:     ICD-9-CM ICD-10-CM   1. GAD (generalized anxiety disorder) 300.02 F41.1   2. MDD (major depressive disorder), recurrent episode, moderate (HCC) 296.32 F33.1     Past Medical History:  Past Medical History:  Diagnosis Date  . Anxiety   . Arthritis   . Asthma   . Depression   . Diverticulitis   . Heart palpitations   . Liver disease   . Migraines   . Sleep apnea     Past Surgical History:  Procedure Laterality Date  . ABDOMINAL HYSTERECTOMY    . APPENDECTOMY    . CHOLECYSTECTOMY    . COLONOSCOPY WITH PROPOFOL N/A 04/21/2015   Procedure: COLONOSCOPY WITH PROPOFOL;  Surgeon: Manya Silvas, MD;  Location: [facility] ENDOSCOPY;  Service: Endoscopy;  Laterality: N/A;  . SINUSOTOMY    . TONSILLECTOMY     Family History:  Family History  Problem Relation Age of Onset  . Diabetes Mother   . Muscular dystrophy Mother   . Heart failure Mother   . Anxiety disorder Mother   . Skin cancer Father   . Heart disease Father   . Hypertension Father   . Depression Father   . Alcohol abuse Father   . Drug abuse Father   . Heart disease Sister   . Diabetes Sister   . Anxiety  disorder Sister   . Hypertension Brother   . Diverticulosis Brother   . Heart disease Sister   . Diabetes Sister   . Heart disease Sister   . Diabetes Sister    Social History:  Social History   Social History  . Marital status: Married    Spouse name: N/A  . Number of children: N/A  . Years of education: N/A   Social History Main Topics  . Smoking status: Former Smoker    Types: Cigarettes    Quit date: 08/31/2004  . Smokeless tobacco: Never Used  . Alcohol use No  . Drug use: No  . Sexual activity: Yes    Birth control/ protection: None   Other Topics Concern  . None   Social History Narrative  . None   Additional History:   Assessment:   Musculoskeletal: Strength & Muscle Tone: within normal limits Gait & Station: normal Patient leans: N/A  Psychiatric Specialty Exam: Medication Refill   Anxiety  Symptoms include nervous/anxious behavior. Patient reports no insomnia or suicidal ideas.    Depression         Associated symptoms include does not have insomnia and no suicidal ideas.  Past medical history includes anxiety.     Review of Systems  Psychiatric/Behavioral: Positive for depression. Negative for hallucinations, memory loss, substance abuse and suicidal ideas. The patient is nervous/anxious. The patient does not have insomnia.   All other systems reviewed and are negative.   Blood pressure 133/84, pulse 74, temperature 98.7 F (37.1 C), temperature source Oral, weight 288 lb 3.2 oz (130.7 kg).Body mass index is 47.96 kg/m.  General Appearance: Neat and Well Groomed  Eye Contact:  Good  Speech:  Normal Rate  Volume:  Normal  Mood:  Has had some good days  Affect:  Constricted  Thought Process:  Linear and Logical  Orientation:  Full (Time, Place, and Person)  Thought Content:  Negative  Suicidal Thoughts:  No  Homicidal Thoughts:  No  Memory:  Immediate;   Good Recent;   Good Remote;   Good  Judgement:  Good  Insight:  Good  Psychomotor  Activity:  Negative  Concentration:  Good  Recall:  Good  Fund of Knowledge: Negative  Language: Good  Akathisia:  Negative  Handed:  Right  AIMS (if indicated):  Not done today.  Assets:  Communication Skills Desire for Improvement Social Support  ADL's:  Intact  Cognition: WNL  Sleep:  better   Is the patient at risk to self?  No. Has the patient been a risk to self in the past 6 months?  No. Has the patient been a risk to self within the distant past?  No. Is the patient a risk to others?  No. Has the patient been a risk to others in the past 6 months?  No. Has the patient been a risk to others within the distant past?  No.  Current Medications: Current Outpatient Prescriptions  Medication Sig Dispense Refill  . amLODipine (NORVASC) 5 MG tablet Take 5 mg by mouth 1 day or 1 dose.    . busPIRone (BUSPAR) 10 MG tablet Take 1 tablet (10 mg total) by mouth 2 (two) times daily. 60 tablet 2  . cetirizine (ZYRTEC ALLERGY) 10 MG tablet Take by mouth.    . escitalopram (LEXAPRO) 10 MG tablet Take 1 tablet daily 30 tablet 1  . FINACEA 15 % cream     . Fluticasone-Salmeterol (ADVAIR DISKUS) 250-50 MCG/DOSE AEPB Inhale into the lungs.    . mometasone (NASONEX) 50 MCG/ACT nasal spray Place into the nose.    . montelukast (SINGULAIR) 10 MG tablet Take by mouth.    . MULTIPLE VITAMIN PO Take by mouth.    . Olopatadine HCl 0.2 % SOLN Apply to eye.    Marland Kitchen omeprazole (PRILOSEC) 20 MG capsule Take by mouth.    . SOOLANTRA 1 % CREA     . topiramate (TOPAMAX) 200 MG tablet     . traZODone (DESYREL) 100 MG tablet Take 1 tablet (100 mg total) by mouth at bedtime. 30 tablet 1  . Vitamin D, Ergocalciferol, (DRISDOL) 50000 units CAPS capsule TAKE 1 CAPSULE BY MOUTH 1 TIME A WEEK    . ipratropium-albuterol (DUONEB) 0.5-2.5 (3) MG/3ML SOLN Inhale into the lungs.     No current facility-administered medications for this visit.     Medical Decision Making:  Established Problem, Stable/Improving (1),  Review of Medication Regimen & Side Effects (2) and Review of New Medication or Change in Dosage (2)  Treatment Plan Summary:Medication management and Plan   Major depressive disorder, recurrent, moderate- Continue Lexapro at 10mg  po qd. Continue therapy with Miguel Dibble, encouraged patient to see Otila Kluver on a weekly basis.  PTSD-Lexapro as above.   Generalized  anxiety disorder- increase BuSpar to 15 mg twice daily.  Insomnia-  Continue trazodone at 100 mg at bedtime.    She is to return in the in 1 months for a follow-up appointment and to call before if needed.  Thedford Bunton 02/24/2016, 10:31 AM

## 2016-03-01 IMAGING — CR DG ABDOMEN 2V
1 series · 3 of 3 positions shown · non-contrast
Comparison: CT Abdomen and Pelvis 08/05/2012. Recent chest
radiographs 03/18/2013.

CLINICAL DATA: 53-year-old female with abdominal pain and vomiting.
Initial encounter.

EXAM:
ABDOMEN - 2 VIEW

[Series 4: w abdomen upright · 0.14mm/px · 3 of 3 slices shown]
[im 1/3]
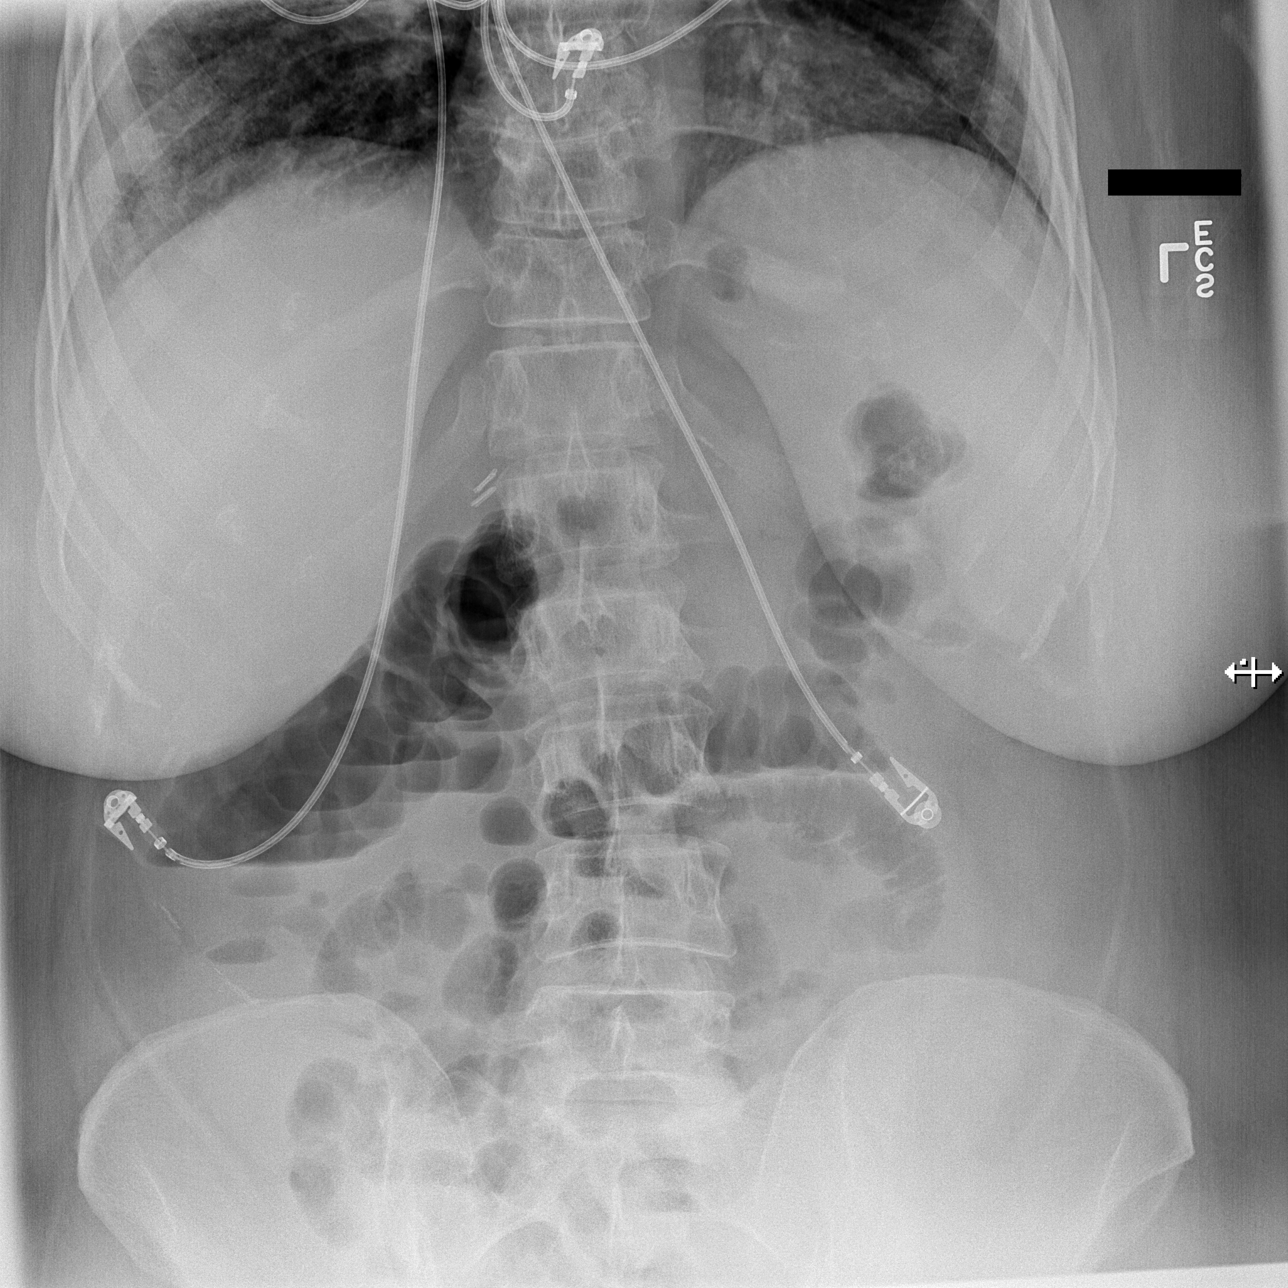
[im 2/3]
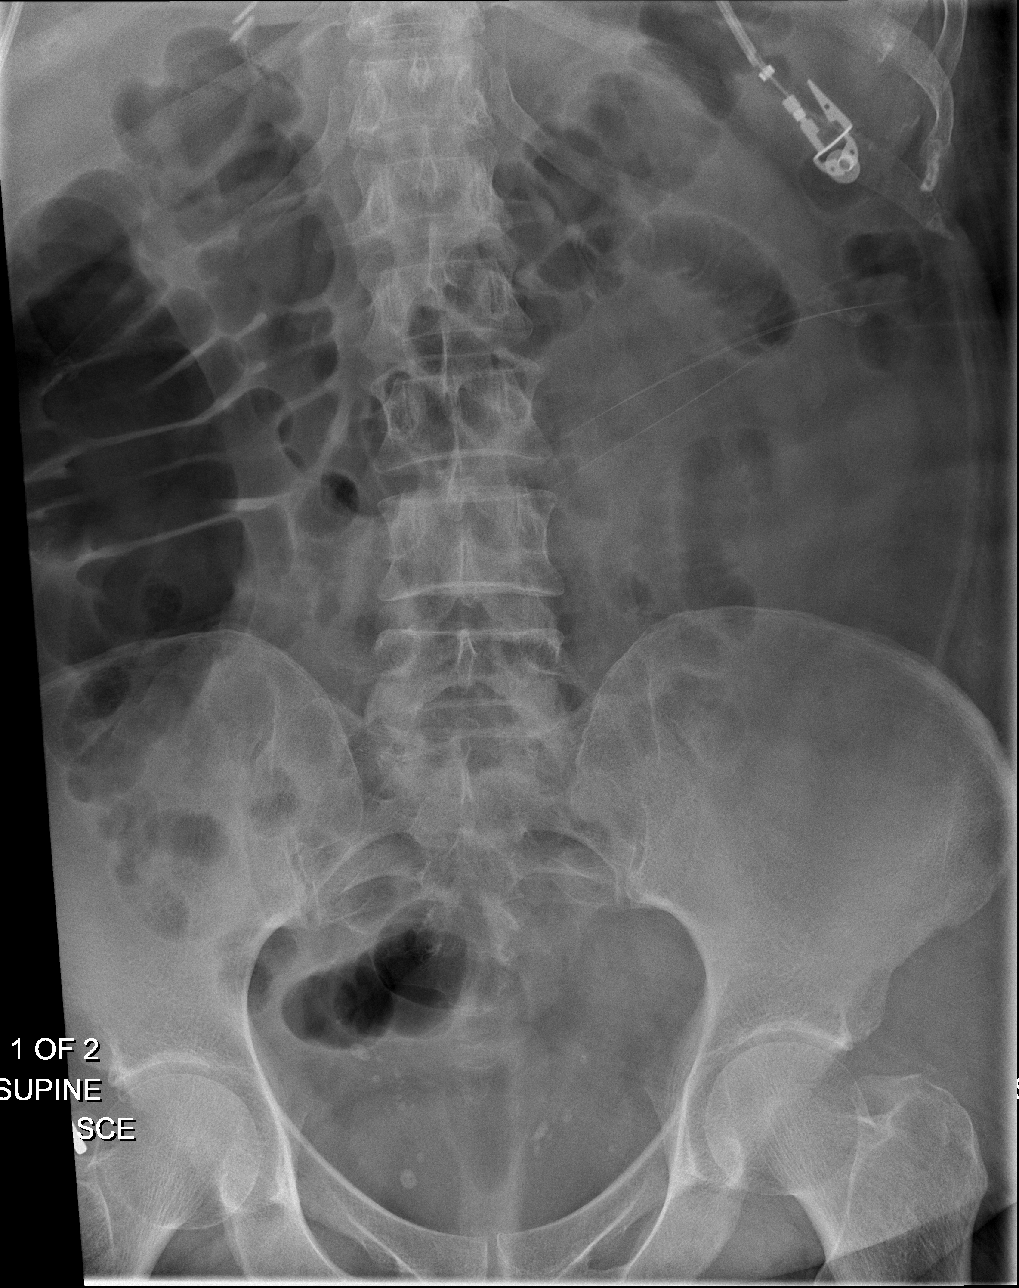
[im 3/3]
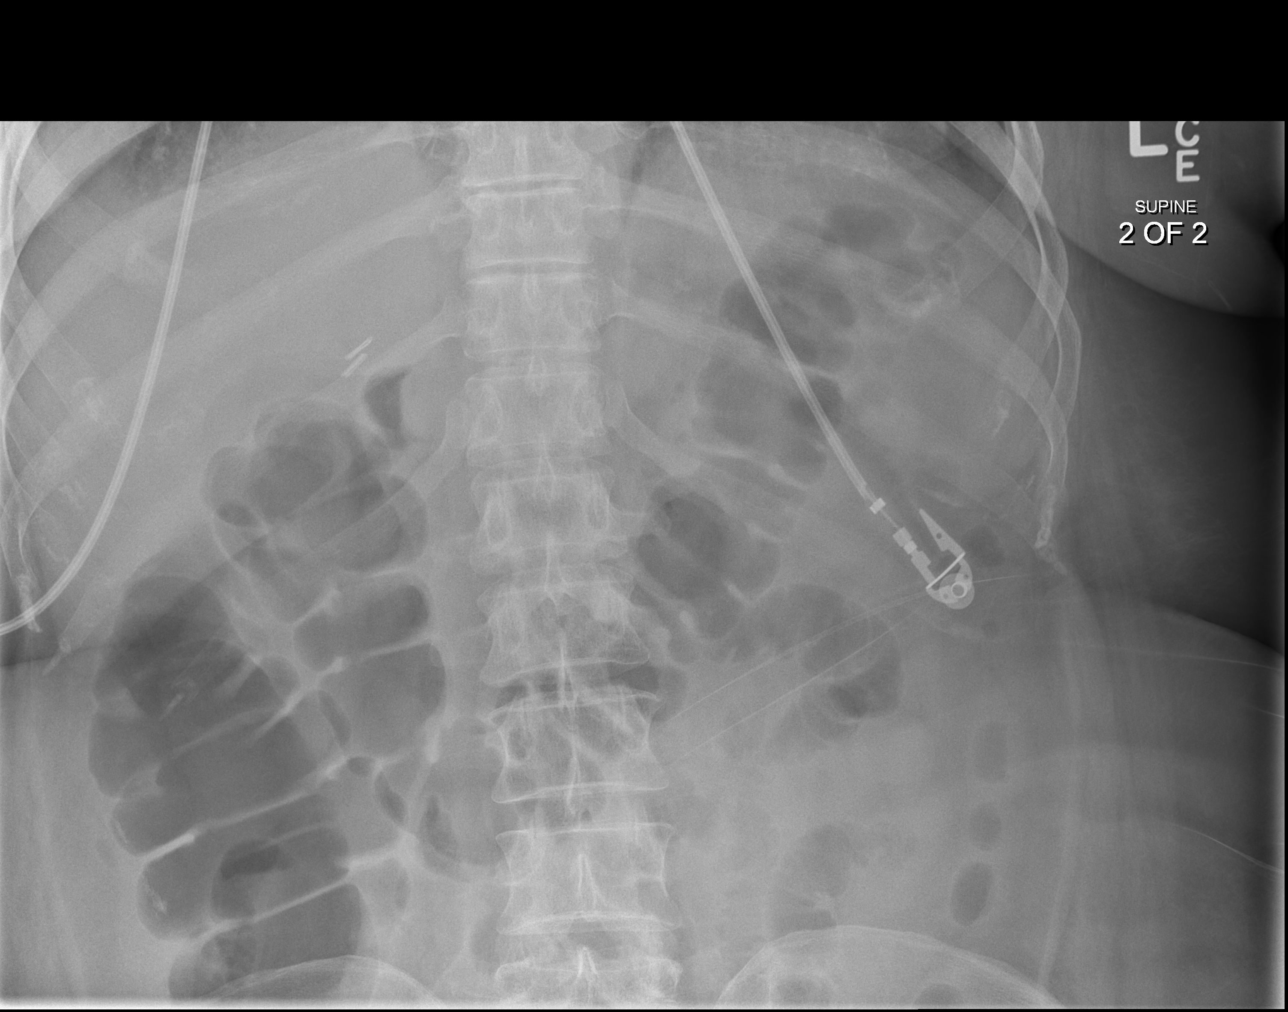

[3 of 3 positions shown; findings below may reference images not displayed]

FINDINGS: Upright view of the abdomen. No pneumoperitoneum. Interval increased
opacity at the right lung base, peripherally.

Non obstructed bowel gas pattern, with a predominance of gas in
nondilated redundant right colon similar to the 2131 CT. Right upper
quadrant surgical clips. Pelvic phleboliths. No acute osseous
abnormality identified.
IMPRESSION: 1.  Non obstructed bowel gas pattern, no free air.
2. Progressed abnormal peripheral right lung base opacity since
yesterday. Recommend repeat chest radiographs.

## 2016-03-02 NOTE — Telephone Encounter (Signed)
Error

## 2016-03-23 ENCOUNTER — Ambulatory Visit (INDEPENDENT_AMBULATORY_CARE_PROVIDER_SITE_OTHER): Payer: 59 | Admitting: Psychiatry

## 2016-03-23 ENCOUNTER — Encounter: Payer: Self-pay | Admitting: Psychiatry

## 2016-03-23 VITALS — BP 121/79 | HR 77 | Temp 98.6°F | Wt 287.8 lb

## 2016-03-23 DIAGNOSIS — F411 Generalized anxiety disorder: Secondary | ICD-10-CM

## 2016-03-23 DIAGNOSIS — F4310 Post-traumatic stress disorder, unspecified: Secondary | ICD-10-CM

## 2016-03-23 DIAGNOSIS — F331 Major depressive disorder, recurrent, moderate: Secondary | ICD-10-CM

## 2016-03-23 MED ORDER — TRAZODONE HCL 100 MG PO TABS: 100.0000 mg | ORAL_TABLET | Freq: Every day | ORAL | 1 refills | Status: DC

## 2016-03-23 MED ORDER — ESCITALOPRAM OXALATE 10 MG PO TABS: ORAL_TABLET | ORAL | 1 refills | Status: DC

## 2016-03-23 NOTE — Progress Notes (Signed)
Patient ID: [name], female   DOB: 01/16/1961, 56 y.o.   MRN: [id number]   [facility] MD/PA/NP OP Progress Note  03/23/2016 9:04 AM [name]  MRN:  [id number]  Subjective:  Patient returns for follow-up of her major depressive disorder, generalized anxiety disorder and grief. She reports that the BuSpar was not filled at the higher dose the last time. She was continuing to take the BuSpar at the 10 mg dosage. Continues to report that her mood is up and down and mostly to do with her family. States that when her brother was alive things were different but her sister has more control over the family and she feels like she is blamed for many things. Reports she continues to enjoy her dogs. Denies any suicidal thoughts.    Chief Complaint: Doing okay Chief Complaint    Follow-up; Medication Refill     Visit Diagnosis:     ICD-9-CM ICD-10-CM   1. GAD (generalized anxiety disorder) 300.02 F41.1   2. MDD (major depressive disorder), recurrent episode, moderate (HCC) 296.32 F33.1   3. Post traumatic stress disorder (PTSD) 309.81 F43.10     Past Medical History:  Past Medical History:  Diagnosis Date  . Anxiety   . Arthritis   . Asthma   . Depression   . Diverticulitis   . Heart palpitations   . Liver disease   . Migraines   . Sleep apnea     Past Surgical History:  Procedure Laterality Date  . ABDOMINAL HYSTERECTOMY    . APPENDECTOMY    . CHOLECYSTECTOMY    . COLONOSCOPY WITH PROPOFOL N/A 04/21/2015   Procedure: COLONOSCOPY WITH PROPOFOL;  Surgeon: Manya Silvas, MD;  Location: [facility] ENDOSCOPY;  Service: Endoscopy;  Laterality: N/A;  . SINUSOTOMY    . TONSILLECTOMY     Family History:  Family History  Problem Relation Age of Onset  . Diabetes Mother   . Muscular dystrophy Mother   . Heart failure Mother   . Anxiety disorder Mother   . Skin cancer Father   . Heart disease Father   . Hypertension Father   . Depression Father   . Alcohol abuse Father   . Drug abuse  Father   . Heart disease Sister   . Diabetes Sister   . Anxiety disorder Sister   . Hypertension Brother   . Diverticulosis Brother   . Heart disease Sister   . Diabetes Sister   . Heart disease Sister   . Diabetes Sister    Social History:  Social History   Social History  . Marital status: Married    Spouse name: N/A  . Number of children: N/A  . Years of education: N/A   Social History Main Topics  . Smoking status: Former Smoker    Types: Cigarettes    Quit date: 08/31/2004  . Smokeless tobacco: Never Used  . Alcohol use No  . Drug use: No  . Sexual activity: Yes    Birth control/ protection: None   Other Topics Concern  . None   Social History Narrative  . None   Additional History:   Assessment:   Musculoskeletal: Strength & Muscle Tone: within normal limits Gait & Station: normal Patient leans: N/A  Psychiatric Specialty Exam: Medication Refill   Anxiety  Symptoms include nervous/anxious behavior. Patient reports no insomnia or suicidal ideas.    Depression         Associated symptoms include does not have insomnia and no suicidal  ideas.  Past medical history includes anxiety.     Review of Systems  Psychiatric/Behavioral: Positive for depression. Negative for hallucinations, memory loss, substance abuse and suicidal ideas. The patient is nervous/anxious. The patient does not have insomnia.   All other systems reviewed and are negative.   Blood pressure 121/79, pulse 77, temperature 98.6 F (37 C), temperature source Oral, weight 287 lb 12.8 oz (130.5 kg).Body mass index is 47.89 kg/m.  General Appearance: Neat and Well Groomed  Eye Contact:  Good  Speech:  Normal Rate  Volume:  Normal  Mood:  Up and down  Affect:  Constricted  Thought Process:  Linear and Logical  Orientation:  Full (Time, Place, and Person)  Thought Content:  Negative  Suicidal Thoughts:  No  Homicidal Thoughts:  No  Memory:  Immediate;   Good Recent;   Good Remote;    Good  Judgement:  Good  Insight:  Good  Psychomotor Activity:  Negative  Concentration:  Good  Recall:  Good  Fund of Knowledge: Negative  Language: Good  Akathisia:  Negative  Handed:  Right  AIMS (if indicated):  Not done today.  Assets:  Communication Skills Desire for Improvement Social Support  ADL's:  Intact  Cognition: WNL  Sleep:  better   Is the patient at risk to self?  No. Has the patient been a risk to self in the past 6 months?  No. Has the patient been a risk to self within the distant past?  No. Is the patient a risk to others?  No. Has the patient been a risk to others in the past 6 months?  No. Has the patient been a risk to others within the distant past?  No.  Current Medications: Current Outpatient Prescriptions  Medication Sig Dispense Refill  . amLODipine (NORVASC) 5 MG tablet Take 5 mg by mouth 1 day or 1 dose.    . busPIRone (BUSPAR) 15 MG tablet Take 1 tablet (15 mg total) by mouth 2 (two) times daily. 60 tablet 1  . cetirizine (ZYRTEC ALLERGY) 10 MG tablet Take by mouth.    . escitalopram (LEXAPRO) 10 MG tablet Take 1 tablet daily 30 tablet 1  . FINACEA 15 % cream     . Fluticasone-Salmeterol (ADVAIR DISKUS) 250-50 MCG/DOSE AEPB Inhale into the lungs.    . mometasone (NASONEX) 50 MCG/ACT nasal spray Place into the nose.    . montelukast (SINGULAIR) 10 MG tablet Take by mouth.    . MULTIPLE VITAMIN PO Take by mouth.    . Olopatadine HCl 0.2 % SOLN Apply to eye.    Marland Kitchen omeprazole (PRILOSEC) 20 MG capsule Take by mouth.    . SOOLANTRA 1 % CREA     . topiramate (TOPAMAX) 200 MG tablet     . traZODone (DESYREL) 100 MG tablet Take 1 tablet (100 mg total) by mouth at bedtime. 30 tablet 1  . Vitamin D, Ergocalciferol, (DRISDOL) 50000 units CAPS capsule TAKE 1 CAPSULE BY MOUTH 1 TIME A WEEK    . ipratropium-albuterol (DUONEB) 0.5-2.5 (3) MG/3ML SOLN Inhale into the lungs.     No current facility-administered medications for this visit.     Medical  Decision Making:  Established Problem, Stable/Improving (1), Review of Medication Regimen & Side Effects (2) and Review of New Medication or Change in Dosage (2)  Treatment Plan Summary:Medication management and Plan   Major depressive disorder, recurrent, moderate- Continue Lexapro at 10mg  po qd. Continue therapy with Miguel Dibble, encouraged patient to  see Otila Kluver on a weekly basis.  PTSD-Lexapro as above.   Generalized anxiety disorder- increase BuSpar to 15 mg twice daily, patient has this increased dose prescription at the pharmacy and will pick it up today  Insomnia-  Continue trazodone at 100 mg at bedtime.    She is to return in the in 2 months for a follow-up appointment and to call before if needed.  Prabhnoor Ellenberger 03/23/2016, 9:04 AM

## 2016-04-20 ENCOUNTER — Other Ambulatory Visit: Payer: Self-pay | Admitting: Internal Medicine

## 2016-04-20 DIAGNOSIS — Z1231 Encounter for screening mammogram for malignant neoplasm of breast: Secondary | ICD-10-CM

## 2016-05-11 ENCOUNTER — Ambulatory Visit
Admission: RE | Admit: 2016-05-11 | Discharge: 2016-05-11 | Disposition: A | Discharge status: Home / Self Care | Payer: Medicare Other | Source: Ambulatory Visit | Attending: Internal Medicine | Admitting: Internal Medicine

## 2016-05-11 DIAGNOSIS — Z1231 Encounter for screening mammogram for malignant neoplasm of breast: Secondary | ICD-10-CM | POA: Insufficient documentation

## 2016-05-23 ENCOUNTER — Ambulatory Visit (INDEPENDENT_AMBULATORY_CARE_PROVIDER_SITE_OTHER): Payer: 59 | Admitting: Psychiatry

## 2016-05-23 ENCOUNTER — Encounter: Payer: Self-pay | Admitting: Psychiatry

## 2016-05-23 VITALS — BP 121/78 | HR 67 | Temp 97.7°F | Wt 287.6 lb

## 2016-05-23 DIAGNOSIS — F4310 Post-traumatic stress disorder, unspecified: Secondary | ICD-10-CM

## 2016-05-23 DIAGNOSIS — F411 Generalized anxiety disorder: Secondary | ICD-10-CM

## 2016-05-23 DIAGNOSIS — F331 Major depressive disorder, recurrent, moderate: Secondary | ICD-10-CM | POA: Diagnosis not present

## 2016-05-23 MED ORDER — BUSPIRONE HCL 15 MG PO TABS: 15.0000 mg | ORAL_TABLET | Freq: Two times a day (BID) | ORAL | 1 refills | Status: DC

## 2016-05-23 MED ORDER — ESCITALOPRAM OXALATE 10 MG PO TABS: ORAL_TABLET | ORAL | 1 refills | Status: DC

## 2016-05-23 NOTE — Progress Notes (Signed)
Patient ID: [name], female   DOB: December 24, 1960, 56 y.o.   MRN: [id number]   [facility] MD/PA/NP OP Progress Note  05/23/2016 10:19 AM [name]  MRN:  [id number]  Subjective:  Patient returns for follow-up of her major depressive disorder, generalized anxiety disorder and grief. Patient reports that she has responded to the BuSpar at 15 mg twice daily with an improvement in her anxiety. States that her mood also slightly better but she continues to be depressed. She is not sleeping as well and states that she does not take the trazodone on a daily basis. States that she uses it once in a while. Denies any suicidal thoughts. She continues to have conflict with one of her older sisters who she reports treats her as a child. She has been seeing Otila Kluver as needed and working on some of these issues. Reports she continues to enjoy her dogs. Denies any suicidal thoughts.    Chief Complaint: Doing better Chief Complaint    Follow-up; Medication Refill     Visit Diagnosis:     ICD-9-CM ICD-10-CM   1. GAD (generalized anxiety disorder) 300.02 F41.1   2. MDD (major depressive disorder), recurrent episode, moderate (HCC) 296.32 F33.1   3. Post traumatic stress disorder (PTSD) 309.81 F43.10     Past Medical History:  Past Medical History:  Diagnosis Date  . Anxiety   . Arthritis   . Asthma   . Depression   . Diverticulitis   . Heart palpitations   . Liver disease   . Migraines   . Sleep apnea     Past Surgical History:  Procedure Laterality Date  . ABDOMINAL HYSTERECTOMY    . APPENDECTOMY    . CHOLECYSTECTOMY    . COLONOSCOPY WITH PROPOFOL N/A 04/21/2015   Procedure: COLONOSCOPY WITH PROPOFOL;  Surgeon: Manya Silvas, MD;  Location: [facility] ENDOSCOPY;  Service: Endoscopy;  Laterality: N/A;  . SINUSOTOMY    . TONSILLECTOMY     Family History:  Family History  Problem Relation Age of Onset  . Diabetes Mother   . Muscular dystrophy Mother   . Heart failure Mother   . Anxiety disorder  Mother   . Skin cancer Father   . Heart disease Father   . Hypertension Father   . Depression Father   . Alcohol abuse Father   . Drug abuse Father   . Heart disease Sister   . Diabetes Sister   . Anxiety disorder Sister   . Hypertension Brother   . Diverticulosis Brother   . Heart disease Sister   . Diabetes Sister   . Heart disease Sister   . Diabetes Sister    Social History:  Social History   Social History  . Marital status: Married    Spouse name: N/A  . Number of children: N/A  . Years of education: N/A   Social History Main Topics  . Smoking status: Former Smoker    Types: Cigarettes    Quit date: 08/31/2004  . Smokeless tobacco: Never Used  . Alcohol use No  . Drug use: No  . Sexual activity: Yes    Birth control/ protection: None   Other Topics Concern  . None   Social History Narrative  . None   Additional History:   Assessment:   Musculoskeletal: Strength & Muscle Tone: within normal limits Gait & Station: normal Patient leans: N/A  Psychiatric Specialty Exam: Medication Refill   Anxiety  Symptoms include nervous/anxious behavior. Patient reports no insomnia  or suicidal ideas.    Depression         Associated symptoms include does not have insomnia and no suicidal ideas.  Past medical history includes anxiety.     Review of Systems  Psychiatric/Behavioral: Positive for depression. Negative for hallucinations, memory loss, substance abuse and suicidal ideas. The patient is nervous/anxious. The patient does not have insomnia.   All other systems reviewed and are negative.   Blood pressure 121/78, pulse 67, temperature 97.7 F (36.5 C), temperature source Oral, weight 287 lb 9.6 oz (130.5 kg).Body mass index is 47.86 kg/m.  General Appearance: Neat and Well Groomed  Eye Contact:  Good  Speech:  Normal Rate  Volume:  Normal  Mood:  Slightly better  Affect:  Constricted  Thought Process:  Linear and Logical  Orientation:  Full (Time,  Place, and Person)  Thought Content:  Negative  Suicidal Thoughts:  No  Homicidal Thoughts:  No  Memory:  Immediate;   Good Recent;   Good Remote;   Good  Judgement:  Good  Insight:  Good  Psychomotor Activity:  Negative  Concentration:  Good  Recall:  Good  Fund of Knowledge: Negative  Language: Good  Akathisia:  Negative  Handed:  Right  AIMS (if indicated):  Not done today.  Assets:  Communication Skills Desire for Improvement Social Support  ADL's:  Intact  Cognition: WNL  Sleep:  better   Is the patient at risk to self?  No. Has the patient been a risk to self in the past 6 months?  No. Has the patient been a risk to self within the distant past?  No. Is the patient a risk to others?  No. Has the patient been a risk to others in the past 6 months?  No. Has the patient been a risk to others within the distant past?  No.  Current Medications: Current Outpatient Prescriptions  Medication Sig Dispense Refill  . amLODipine (NORVASC) 5 MG tablet Take 5 mg by mouth 1 day or 1 dose.    . busPIRone (BUSPAR) 15 MG tablet Take 1 tablet (15 mg total) by mouth 2 (two) times daily. 60 tablet 1  . cetirizine (ZYRTEC ALLERGY) 10 MG tablet Take by mouth.    . escitalopram (LEXAPRO) 10 MG tablet Take 1 and half tablet by mouth daily 45 tablet 1  . FINACEA 15 % cream     . Fluticasone-Salmeterol (ADVAIR DISKUS) 250-50 MCG/DOSE AEPB Inhale into the lungs.    . mometasone (NASONEX) 50 MCG/ACT nasal spray Place into the nose.    . montelukast (SINGULAIR) 10 MG tablet Take by mouth.    . MULTIPLE VITAMIN PO Take by mouth.    . Olopatadine HCl 0.2 % SOLN Apply to eye.    Marland Kitchen omeprazole (PRILOSEC) 20 MG capsule Take by mouth.    . SOOLANTRA 1 % CREA     . topiramate (TOPAMAX) 200 MG tablet     . traZODone (DESYREL) 100 MG tablet Take 1 tablet (100 mg total) by mouth at bedtime. 30 tablet 1  . Vitamin D, Ergocalciferol, (DRISDOL) 50000 units CAPS capsule TAKE 1 CAPSULE BY MOUTH 1 TIME A WEEK     . ipratropium-albuterol (DUONEB) 0.5-2.5 (3) MG/3ML SOLN Inhale into the lungs.     No current facility-administered medications for this visit.     Medical Decision Making:  Established Problem, Stable/Improving (1), Review of Medication Regimen & Side Effects (2) and Review of New Medication or Change in Dosage (2)  Treatment Plan Summary:Medication management and Plan   Major depressive disorder, recurrent, moderate- Increase  Lexapro to 15mg  po qd. Continue therapy with Miguel Dibble.  PTSD-Lexapro as above.   Generalized anxiety disorder- Continue BuSpar to 15 mg twice daily.  Insomnia-  Continue trazodone at 100 mg at bedtime.    She is to return  in 1 months for a follow-up appointment and to call before if needed.  Hurshell Dino 05/23/2016, 10:19 AM

## 2016-06-22 ENCOUNTER — Ambulatory Visit (INDEPENDENT_AMBULATORY_CARE_PROVIDER_SITE_OTHER): Payer: 59 | Admitting: Psychiatry

## 2016-06-22 ENCOUNTER — Encounter: Payer: Self-pay | Admitting: Psychiatry

## 2016-06-22 VITALS — BP 128/83 | HR 73 | Temp 98.9°F | Wt 287.4 lb

## 2016-06-22 DIAGNOSIS — F331 Major depressive disorder, recurrent, moderate: Secondary | ICD-10-CM

## 2016-06-22 DIAGNOSIS — F411 Generalized anxiety disorder: Secondary | ICD-10-CM | POA: Diagnosis not present

## 2016-06-22 MED ORDER — ESCITALOPRAM OXALATE 10 MG PO TABS: ORAL_TABLET | ORAL | 1 refills | Status: DC

## 2016-06-22 MED ORDER — BUSPIRONE HCL 15 MG PO TABS: 15.0000 mg | ORAL_TABLET | Freq: Two times a day (BID) | ORAL | 1 refills | Status: DC

## 2016-06-22 NOTE — Progress Notes (Signed)
Patient ID: [name], female   DOB: Aug 30, 1960, 56 y.o.   MRN: [id number]   [facility] MD/PA/NP OP Progress Note  06/22/2016 10:12 AM RUTA CAPECE  MRN:  [id number]  Subjective:  Patient returns for follow-up of her major depressive disorder, generalized anxiety disorder and grief. Reports her mood is better since increasing the Lexapro to 15mg . Denies any side effects. She is sleeping better,. Feels anxiety is also under control. Denies any suicidal thoughts. She is working with Otila Kluver on her goals. States she is working on goals to decrease his stress with her sisters and trying to understand what she can control and what she cannot.   Chief Complaint: Doing better Chief Complaint    Follow-up; Medication Refill     Visit Diagnosis:     ICD-10-CM   1. GAD (generalized anxiety disorder) F41.1   2. MDD (major depressive disorder), recurrent episode, moderate ([place]) F33.1     Past Medical History:  Past Medical History:  Diagnosis Date  . Anxiety   . Arthritis   . Asthma   . Depression   . Diverticulitis   . Heart palpitations   . Liver disease   . Migraines   . Sleep apnea     Past Surgical History:  Procedure Laterality Date  . ABDOMINAL HYSTERECTOMY    . APPENDECTOMY    . CHOLECYSTECTOMY    . COLONOSCOPY WITH PROPOFOL N/A 04/21/2015   Procedure: COLONOSCOPY WITH PROPOFOL;  Surgeon: Manya Silvas, MD;  Location: [facility] ENDOSCOPY;  Service: Endoscopy;  Laterality: N/A;  . SINUSOTOMY    . TONSILLECTOMY     Family History:  Family History  Problem Relation Age of Onset  . Diabetes Mother   . Muscular dystrophy Mother   . Heart failure Mother   . Anxiety disorder Mother   . Skin cancer Father   . Heart disease Father   . Hypertension Father   . Depression Father   . Alcohol abuse Father   . Drug abuse Father   . Heart disease Sister   . Diabetes Sister   . Anxiety disorder Sister   . Hypertension Brother   . Diverticulosis Brother   . Heart disease Sister   .  Diabetes Sister   . Heart disease Sister   . Diabetes Sister    Social History:  Social History   Social History  . Marital status: Married    Spouse name: N/A  . Number of children: N/A  . Years of education: N/A   Social History Main Topics  . Smoking status: Former Smoker    Types: Cigarettes    Quit date: 08/31/2004  . Smokeless tobacco: Never Used  . Alcohol use No  . Drug use: No  . Sexual activity: Yes    Birth control/ protection: None   Other Topics Concern  . None   Social History Narrative  . None   Additional History:   Assessment:   Musculoskeletal: Strength & Muscle Tone: within normal limits Gait & Station: normal Patient leans: N/A  Psychiatric Specialty Exam: Medication Refill   Anxiety  Patient reports no insomnia, nervous/anxious behavior or suicidal ideas.    Depression         Associated symptoms include does not have insomnia and no suicidal ideas.  Past medical history includes anxiety.     Review of Systems  Psychiatric/Behavioral: Negative for depression, hallucinations, memory loss, substance abuse and suicidal ideas. The patient is not nervous/anxious and does not have insomnia.  All other systems reviewed and are negative.   Blood pressure 128/83, pulse 73, temperature 98.9 F (37.2 C), temperature source Oral, weight 287 lb 6.4 oz (130.4 kg).Body mass index is 47.83 kg/m.  General Appearance: Neat and Well Groomed  Eye Contact:  Good  Speech:  Normal Rate  Volume:  Normal  Mood:  Slightly better  Affect:  brighter  Thought Process:  Linear and Logical  Orientation:  Full (Time, Place, and Person)  Thought Content:  Negative  Suicidal Thoughts:  No  Homicidal Thoughts:  No  Memory:  Immediate;   Good Recent;   Good Remote;   Good  Judgement:  Good  Insight:  Good  Psychomotor Activity:  Negative  Concentration:  Good  Recall:  Good  Fund of Knowledge: Negative  Language: Good  Akathisia:  Negative  Handed:  Right   AIMS (if indicated):  Not done today.  Assets:  Communication Skills Desire for Improvement Social Support  ADL's:  Intact  Cognition: WNL  Sleep:  better   Is the patient at risk to self?  No. Has the patient been a risk to self in the past 6 months?  No. Has the patient been a risk to self within the distant past?  No. Is the patient a risk to others?  No. Has the patient been a risk to others in the past 6 months?  No. Has the patient been a risk to others within the distant past?  No.  Current Medications: Current Outpatient Prescriptions  Medication Sig Dispense Refill  . amLODipine (NORVASC) 5 MG tablet Take 5 mg by mouth 1 day or 1 dose.    . busPIRone (BUSPAR) 15 MG tablet Take 1 tablet (15 mg total) by mouth 2 (two) times daily. 60 tablet 1  . cetirizine (ZYRTEC ALLERGY) 10 MG tablet Take by mouth.    . escitalopram (LEXAPRO) 10 MG tablet Take 1 and half tablet by mouth daily 45 tablet 1  . FINACEA 15 % cream     . Fluticasone-Salmeterol (ADVAIR DISKUS) 250-50 MCG/DOSE AEPB Inhale into the lungs.    . mometasone (NASONEX) 50 MCG/ACT nasal spray Place into the nose.    . montelukast (SINGULAIR) 10 MG tablet Take by mouth.    . MULTIPLE VITAMIN PO Take by mouth.    . Olopatadine HCl 0.2 % SOLN Apply to eye.    Marland Kitchen omeprazole (PRILOSEC) 20 MG capsule Take by mouth.    . SOOLANTRA 1 % CREA     . topiramate (TOPAMAX) 200 MG tablet     . traZODone (DESYREL) 100 MG tablet Take 1 tablet (100 mg total) by mouth at bedtime. 30 tablet 1  . Vitamin D, Ergocalciferol, (DRISDOL) 50000 units CAPS capsule TAKE 1 CAPSULE BY MOUTH 1 TIME A WEEK    . ipratropium-albuterol (DUONEB) 0.5-2.5 (3) MG/3ML SOLN Inhale into the lungs.     No current facility-administered medications for this visit.     Medical Decision Making:  Established Problem, Stable/Improving (1), Review of Medication Regimen & Side Effects (2) and Review of New Medication or Change in Dosage (2)  Treatment Plan  Summary:Medication management and Plan   Major depressive disorder, recurrent, moderate- Continue  Lexapro at 15mg  po qd. Continue therapy with Miguel Dibble.  PTSD-Lexapro as above.   Generalized anxiety disorder- Continue BuSpar to 15 mg twice daily.  Insomnia-  Continue trazodone at 100 mg at bedtime.    She is to return  in 2 months for a follow-up  appointment and to call before if needed.  Oreatha Fabry 06/22/2016, 10:12 AM

## 2016-08-22 DIAGNOSIS — I2510 Atherosclerotic heart disease of native coronary artery without angina pectoris: Secondary | ICD-10-CM | POA: Insufficient documentation

## 2016-08-22 DIAGNOSIS — M797 Fibromyalgia: Secondary | ICD-10-CM | POA: Insufficient documentation

## 2016-08-23 ENCOUNTER — Ambulatory Visit: Payer: 59 | Admitting: Psychiatry

## 2016-08-30 ENCOUNTER — Ambulatory Visit (INDEPENDENT_AMBULATORY_CARE_PROVIDER_SITE_OTHER): Payer: 59 | Admitting: Psychiatry

## 2016-08-30 ENCOUNTER — Encounter: Payer: Self-pay | Admitting: Psychiatry

## 2016-08-30 VITALS — BP 121/80 | HR 73 | Temp 98.4°F | Wt 290.4 lb

## 2016-08-30 DIAGNOSIS — F411 Generalized anxiety disorder: Secondary | ICD-10-CM | POA: Diagnosis not present

## 2016-08-30 DIAGNOSIS — F331 Major depressive disorder, recurrent, moderate: Secondary | ICD-10-CM | POA: Diagnosis not present

## 2016-08-30 DIAGNOSIS — F4310 Post-traumatic stress disorder, unspecified: Secondary | ICD-10-CM

## 2016-08-30 MED ORDER — ESCITALOPRAM OXALATE 10 MG PO TABS: ORAL_TABLET | ORAL | 1 refills | Status: DC

## 2016-08-30 MED ORDER — BUSPIRONE HCL 15 MG PO TABS: 15.0000 mg | ORAL_TABLET | Freq: Three times a day (TID) | ORAL | 1 refills | Status: DC

## 2016-08-30 NOTE — Progress Notes (Signed)
Patient ID: [name], female   DOB: 1960-07-25, 56 y.o.   MRN: [id number]   [facility] MD/PA/NP OP Progress Note  08/30/2016 10:37 AM [name]  MRN:  [id number]  Subjective:  Patient returns for follow-up of her major depressive disorder, generalized anxiety disorder and grief. Reports having some stress from marital issues. States her husband does not like the changes she is trying to make in her life. States that she is somewhat overwhelmed and also having some flashbacks from her PTSD. Patient a little emotional. States that her husband is not interested in her becoming a Saturday. States that he thinks she should have been all better by now. She reports some vague suicidal thoughts but states that she is not going to be doing anything to hurt herself.  Chief Complaint: Increased stress or past month Chief Complaint    Follow-up; Medication Refill     Visit Diagnosis:     ICD-10-CM   1. GAD (generalized anxiety disorder) F41.1   2. Post traumatic stress disorder (PTSD) F43.10   3. MDD (major depressive disorder), recurrent episode, moderate ([place]) F33.1     Past Medical History:  Past Medical History:  Diagnosis Date  . Anxiety   . Arthritis   . Asthma   . Depression   . Diverticulitis   . Heart palpitations   . Liver disease   . Migraines   . Sleep apnea     Past Surgical History:  Procedure Laterality Date  . ABDOMINAL HYSTERECTOMY    . APPENDECTOMY    . CHOLECYSTECTOMY    . COLONOSCOPY WITH PROPOFOL N/A 04/21/2015   Procedure: COLONOSCOPY WITH PROPOFOL;  Surgeon: Manya Silvas, MD;  Location[facility] ENDOSCOPY;  Service: Endoscopy;  Laterality: N/A;  . SINUSOTOMY    . TONSILLECTOMY     Family History:  Family History  Problem Relation Age of Onset  . Diabetes Mother   . Muscular dystrophy Mother   . Heart failure Mother   . Anxiety disorder Mother   . Skin cancer Father   . Heart disease Father   . Hypertension Father   . Depression Father   . Alcohol abuse  Father   . Drug abuse Father   . Heart disease Sister   . Diabetes Sister   . Anxiety disorder Sister   . Hypertension Brother   . Diverticulosis Brother   . Heart disease Sister   . Diabetes Sister   . Heart disease Sister   . Diabetes Sister    Social History:  Social History   Social History  . Marital status: Married    Spouse name: N/A  . Number of children: N/A  . Years of education: N/A   Social History Main Topics  . Smoking status: Former Smoker    Types: Cigarettes    Quit date: 08/31/2004  . Smokeless tobacco: Never Used  . Alcohol use No  . Drug use: No  . Sexual activity: Yes    Birth control/ protection: None   Other Topics Concern  . None   Social History Narrative  . None   Additional History:   Assessment:   Musculoskeletal: Strength & Muscle Tone: within normal limits Gait & Station: normal Patient leans: N/A  Psychiatric Specialty Exam: Medication Refill   Anxiety  Patient reports no insomnia, nervous/anxious behavior or suicidal ideas.    Depression         Associated symptoms include does not have insomnia and no suicidal ideas.  Past medical  history includes anxiety.     Review of Systems  Psychiatric/Behavioral: Negative for depression, hallucinations, memory loss, substance abuse and suicidal ideas. The patient is not nervous/anxious and does not have insomnia.   All other systems reviewed and are negative.   Blood pressure 121/80, pulse 73, temperature 98.4 F (36.9 C), temperature source Oral, weight 290 lb 6.4 oz (131.7 kg).Body mass index is 48.33 kg/m.  General Appearance: Neat and Well Groomed  Eye Contact:  Good  Speech:  Normal Rate  Volume:  Normal  Mood:  Okay   Affect:  Anxious   Thought Process:  Linear and Logical  Orientation:  Full (Time, Place, and Person)  Thought Content:  Negative  Suicidal Thoughts:  Passive thoughts of being dead without a plan to hurt self   Homicidal Thoughts:  No  Memory:   Immediate;   Good Recent;   Good Remote;   Good  Judgement:  Good  Insight:  Good  Psychomotor Activity:  Negative  Concentration:  Good  Recall:  Good  Fund of Knowledge: Negative  Language: Good  Akathisia:  Negative  Handed:  Right  AIMS (if indicated):  Not done today.  Assets:  Communication Skills Desire for Improvement Social Support  ADL's:  Intact  Cognition: WNL  Sleep:  better   Is the patient at risk to self?  No. Has the patient been a risk to self in the past 6 months?  No. Has the patient been a risk to self within the distant past?  No. Is the patient a risk to others?  No. Has the patient been a risk to others in the past 6 months?  No. Has the patient been a risk to others within the distant past?  No.  Current Medications: Current Outpatient Prescriptions  Medication Sig Dispense Refill  . amLODipine (NORVASC) 5 MG tablet Take 5 mg by mouth 1 day or 1 dose.    . busPIRone (BUSPAR) 15 MG tablet Take 1 tablet (15 mg total) by mouth 2 (two) times daily. 60 tablet 1  . cetirizine (ZYRTEC ALLERGY) 10 MG tablet Take by mouth.    . escitalopram (LEXAPRO) 10 MG tablet Take 1 and half tablet by mouth daily 45 tablet 1  . FINACEA 15 % cream     . Fluticasone-Salmeterol (ADVAIR DISKUS) 250-50 MCG/DOSE AEPB Inhale into the lungs.    . hydrocortisone (ANUSOL-HC) 25 MG suppository Place rectally.    . metoprolol succinate (TOPROL-XL) 25 MG 24 hr tablet   3  . mometasone (NASONEX) 50 MCG/ACT nasal spray Place into the nose.    . montelukast (SINGULAIR) 10 MG tablet Take by mouth.    . MULTIPLE VITAMIN PO Take by mouth.    . nitroGLYCERIN (NITROSTAT) 0.4 MG SL tablet Place under the tongue.    Marland Kitchen Olopatadine HCl 0.2 % SOLN Apply to eye.    Marland Kitchen omeprazole (PRILOSEC) 20 MG capsule Take by mouth.    . rosuvastatin (CRESTOR) 5 MG tablet Take by mouth.    . SOOLANTRA 1 % CREA     . tizanidine (ZANAFLEX) 2 MG capsule   1  . topiramate (TOPAMAX) 200 MG tablet     . traZODone  (DESYREL) 100 MG tablet Take 1 tablet (100 mg total) by mouth at bedtime. 30 tablet 1  . Vitamin D, Ergocalciferol, (DRISDOL) 50000 units CAPS capsule TAKE 1 CAPSULE BY MOUTH 1 TIME A WEEK    . ipratropium-albuterol (DUONEB) 0.5-2.5 (3) MG/3ML SOLN Inhale into the lungs.  No current facility-administered medications for this visit.     Medical Decision Making:  Established Problem, Stable/Improving (1), Review of Medication Regimen & Side Effects (2) and Review of New Medication or Change in Dosage (2)  Treatment Plan Summary:Medication management and Plan   Major depressive disorder, recurrent, moderate- Continue  Lexapro at 15mg  po qd. Continue therapy with Miguel Dibble.  PTSD-Lexapro as above.   Generalized anxiety disorder- increase BuSpar to 15 mg 3 times daily.  Insomnia-  Continue trazodone at 100 mg at bedtime.    She is to return  in 1 months for a follow-up appointment and to call before if needed. He shouldn't aware of safety plan if she has suicidal thoughts and to call 911 or drive to the nearest emergency room. Patient understands this.  Deuce Paternoster 08/30/2016, 10:37 AM

## 2016-11-06 DIAGNOSIS — I519 Heart disease, unspecified: Secondary | ICD-10-CM | POA: Insufficient documentation

## 2016-11-06 DIAGNOSIS — I10 Essential (primary) hypertension: Secondary | ICD-10-CM | POA: Insufficient documentation

## 2016-11-06 DIAGNOSIS — E7800 Pure hypercholesterolemia, unspecified: Secondary | ICD-10-CM | POA: Insufficient documentation

## 2016-11-13 ENCOUNTER — Ambulatory Visit (INDEPENDENT_AMBULATORY_CARE_PROVIDER_SITE_OTHER): Payer: 59 | Admitting: Psychiatry

## 2016-11-13 ENCOUNTER — Encounter: Payer: Self-pay | Admitting: Psychiatry

## 2016-11-13 VITALS — BP 132/83 | HR 84 | Temp 98.2°F | Wt 292.0 lb

## 2016-11-13 DIAGNOSIS — F331 Major depressive disorder, recurrent, moderate: Secondary | ICD-10-CM

## 2016-11-13 DIAGNOSIS — F411 Generalized anxiety disorder: Secondary | ICD-10-CM

## 2016-11-13 DIAGNOSIS — F4310 Post-traumatic stress disorder, unspecified: Secondary | ICD-10-CM

## 2016-11-13 MED ORDER — BUSPIRONE HCL 30 MG PO TABS: 30.0000 mg | ORAL_TABLET | Freq: Two times a day (BID) | ORAL | 1 refills | Status: DC

## 2016-11-13 MED ORDER — ESCITALOPRAM OXALATE 10 MG PO TABS: ORAL_TABLET | ORAL | 1 refills | Status: DC

## 2016-11-13 MED ORDER — BUSPIRONE HCL 30 MG PO TABS: 30.0000 mg | ORAL_TABLET | Freq: Three times a day (TID) | ORAL | 1 refills | Status: DC

## 2016-11-13 MED ORDER — TRAZODONE HCL 100 MG PO TABS: 100.0000 mg | ORAL_TABLET | Freq: Every day | ORAL | 1 refills | Status: DC

## 2016-11-13 NOTE — Progress Notes (Signed)
Patient ID: [name], female   DOB: 1960/04/09, 56 y.o.   MRN: [id number]   [facility] MD/PA/NP OP Progress Note  11/13/2016 8:44 AM [name]  MRN:  [id number]  Subjective:  Patient returns for follow-up of her major depressive disorder, generalized anxiety disorder and grief. Patient today reports that the relationship with her husband has slightly improved. States that he is beginning to understand that she will always have some anxiety. States that she continues to have some panic attacks and when she leaves the house she has to get back home in a certain amount of time. States that her home is her safety zone. States that the increase in the BuSpar was helpful to some extent. She also injured her leg by falling on a piece of missionary and is coping with that. States that she continues to have passive suicidal thoughts and once picked up a razor but did not do anything. Today she denies any suicidal thoughts. She is agreeable to increasing the BuSpar to the next dosage.    Chief Complaint: slightly better Chief Complaint    Follow-up; Medication Refill     Visit Diagnosis:     ICD-10-CM   1. GAD (generalized anxiety disorder) F41.1   2. Post traumatic stress disorder (PTSD) F43.10   3. MDD (major depressive disorder), recurrent episode, moderate ([place]) F33.1     Past Medical History:  Past Medical History:  Diagnosis Date  . Anxiety   . Arthritis   . Asthma   . Depression   . Diverticulitis   . Heart palpitations   . Liver disease   . Migraines   . Sleep apnea     Past Surgical History:  Procedure Laterality Date  . ABDOMINAL HYSTERECTOMY    . APPENDECTOMY    . CHOLECYSTECTOMY    . COLONOSCOPY WITH PROPOFOL N/A 04/21/2015   Procedure: COLONOSCOPY WITH PROPOFOL;  Surgeon: Manya Silvas, MD;  Location: [facility] ENDOSCOPY;  Service: Endoscopy;  Laterality: N/A;  . SINUSOTOMY    . TONSILLECTOMY     Family History:  Family History  Problem Relation Age of Onset  .  Diabetes Mother   . Muscular dystrophy Mother   . Heart failure Mother   . Anxiety disorder Mother   . Skin cancer Father   . Heart disease Father   . Hypertension Father   . Depression Father   . Alcohol abuse Father   . Drug abuse Father   . Heart disease Sister   . Diabetes Sister   . Anxiety disorder Sister   . Hypertension Brother   . Diverticulosis Brother   . Heart disease Sister   . Diabetes Sister   . Heart disease Sister   . Diabetes Sister    Social History:  Social History   Social History  . Marital status: Married    Spouse name: N/A  . Number of children: N/A  . Years of education: N/A   Social History Main Topics  . Smoking status: Former Smoker    Types: Cigarettes    Quit date: 08/31/2004  . Smokeless tobacco: Never Used  . Alcohol use No  . Drug use: No  . Sexual activity: Yes    Birth control/ protection: None   Other Topics Concern  . None   Social History Narrative  . None   Additional History:   Assessment:   Musculoskeletal: Strength & Muscle Tone: within normal limits Gait & Station: normal Patient leans: N/A  Psychiatric Specialty Exam:  Medication Refill   Anxiety  Patient reports no insomnia, nervous/anxious behavior or suicidal ideas.    Depression         Associated symptoms include does not have insomnia and no suicidal ideas.  Past medical history includes anxiety.     Review of Systems  Psychiatric/Behavioral: Negative for depression, hallucinations, memory loss, substance abuse and suicidal ideas. The patient is not nervous/anxious and does not have insomnia.   All other systems reviewed and are negative.   Blood pressure 132/83, pulse 84, temperature 98.2 F (36.8 C), temperature source Oral, weight 292 lb (132.5 kg).Body mass index is 48.59 kg/m.  General Appearance: Neat and Well Groomed  Eye Contact:  Good  Speech:  Normal Rate  Volume:  Normal  Mood:  Slightly improved   Affect: Pleasant   Thought  Process:  Linear and Logical  Orientation:  Full (Time, Place, and Person)  Thought Content:  Negative  Suicidal Thoughts:  Passive thoughts of being dead without a plan to hurt self   Homicidal Thoughts:  No  Memory:  Immediate;   Good Recent;   Good Remote;   Good  Judgement:  Good  Insight:  Good  Psychomotor Activity:  Negative  Concentration:  Good  Recall:  Good  Fund of Knowledge: Negative  Language: Good  Akathisia:  Negative  Handed:  Right  AIMS (if indicated):  Not done today.  Assets:  Communication Skills Desire for Improvement Social Support  ADL's:  Intact  Cognition: WNL  Sleep:  better   Is the patient at risk to self?  No. Has the patient been a risk to self in the past 6 months?  No. Has the patient been a risk to self within the distant past?  No. Is the patient a risk to others?  No. Has the patient been a risk to others in the past 6 months?  No. Has the patient been a risk to others within the distant past?  No.  Current Medications: Current Outpatient Prescriptions  Medication Sig Dispense Refill  . amLODipine (NORVASC) 5 MG tablet Take 5 mg by mouth 1 day or 1 dose.    . busPIRone (BUSPAR) 15 MG tablet Take 1 tablet (15 mg total) by mouth 3 (three) times daily. 90 tablet 1  . cetirizine (ZYRTEC ALLERGY) 10 MG tablet Take by mouth.    . escitalopram (LEXAPRO) 10 MG tablet Take 1 and half tablet by mouth daily 45 tablet 1  . FINACEA 15 % cream     . Fluticasone-Salmeterol (ADVAIR DISKUS) 250-50 MCG/DOSE AEPB Inhale into the lungs.    . hydrocortisone (ANUSOL-HC) 25 MG suppository Place rectally.    . metoprolol succinate (TOPROL-XL) 25 MG 24 hr tablet   3  . mometasone (NASONEX) 50 MCG/ACT nasal spray Place into the nose.    . montelukast (SINGULAIR) 10 MG tablet Take by mouth.    . MULTIPLE VITAMIN PO Take by mouth.    . nitroGLYCERIN (NITROSTAT) 0.4 MG SL tablet Place under the tongue.    Marland Kitchen Olopatadine HCl 0.2 % SOLN Apply to eye.    Marland Kitchen  omeprazole (PRILOSEC) 20 MG capsule Take by mouth.    . rosuvastatin (CRESTOR) 5 MG tablet Take by mouth.    . SOOLANTRA 1 % CREA     . tizanidine (ZANAFLEX) 2 MG capsule   1  . topiramate (TOPAMAX) 200 MG tablet     . traZODone (DESYREL) 100 MG tablet Take 1 tablet (100 mg total) by  mouth at bedtime. 30 tablet 1  . Vitamin D, Ergocalciferol, (DRISDOL) 50000 units CAPS capsule TAKE 1 CAPSULE BY MOUTH 1 TIME A WEEK    . ipratropium-albuterol (DUONEB) 0.5-2.5 (3) MG/3ML SOLN Inhale into the lungs.     No current facility-administered medications for this visit.     Medical Decision Making:  Established Problem, Stable/Improving (1), Review of Medication Regimen & Side Effects (2) and Review of New Medication or Change in Dosage (2)  Treatment Plan Summary:Medication management and Plan   Major depressive disorder, recurrent, moderate- Continue  Lexapro at 15mg  po qd. Continue therapy with Miguel Dibble and work on issues related to her trauma in the past.  PTSD-Lexapro as above.   Generalized anxiety disorder- increase BuSpar to 30 mg 2 times daily.  Insomnia-  Continue trazodone at 100 mg at bedtime.    She is to return  in 1 months for a follow-up appointment and to call before if needed. Patient aware of safety plan if she has suicidal thoughts and to call 911 or drive to the nearest emergency room. Patient understands this.  Jen Benedict 11/13/2016, 8:44 AM

## 2016-11-20 ENCOUNTER — Ambulatory Visit: Payer: Medicare Other | Admitting: Surgery

## 2016-11-22 NOTE — Progress Notes (Signed)
[name], [name] ([id number]) Visit Report for 11/20/2016 Allergy List Details Patient Name: [name], [name] Date of Service: 11/20/2016 8:00 AM Medical Record Number: [id number] Patient Account Number: [id number] Date of Birth/Sex: 03/25/1960 (56 y.o. Female) Treating RN: Roger Shelter Primary Care Valerya Maxton: Other Clinician: Referring Marita Burnsed: Treating Davon Abdelaziz/Extender: Frann Rider in Treatment: 0 Allergies Active Allergies Cefotan Reaction: rash Darvocet-N Reaction: hallucination Iodinated Contrast- Oral and IV Dye phenergan Reaction: hives sulfa Reaction: hives penicillin Reaction: hives saccharin Reaction: hives aminoglycosides Reaction: hives cefuroximie Influenza Virus Vaccines Reaction: swelling and hives latex Allergy Notes Electronic Signature(s) Signed: 11/20/2016 8:07:34 AM By: Roger Shelter Entered By: Roger Shelter on 11/20/2016 08:07:34

## 2016-11-23 ENCOUNTER — Encounter: Payer: Medicare Other | Attending: Surgery | Admitting: Surgery

## 2016-11-23 DIAGNOSIS — S81812A Laceration without foreign body, left lower leg, initial encounter: Secondary | ICD-10-CM | POA: Insufficient documentation

## 2016-11-23 DIAGNOSIS — X58XXXA Exposure to other specified factors, initial encounter: Secondary | ICD-10-CM | POA: Insufficient documentation

## 2016-11-23 DIAGNOSIS — Z79899 Other long term (current) drug therapy: Secondary | ICD-10-CM | POA: Diagnosis not present

## 2016-11-23 DIAGNOSIS — G4730 Sleep apnea, unspecified: Secondary | ICD-10-CM | POA: Insufficient documentation

## 2016-11-23 DIAGNOSIS — F419 Anxiety disorder, unspecified: Secondary | ICD-10-CM | POA: Insufficient documentation

## 2016-11-23 DIAGNOSIS — Z880 Allergy status to penicillin: Secondary | ICD-10-CM | POA: Insufficient documentation

## 2016-11-23 DIAGNOSIS — J45909 Unspecified asthma, uncomplicated: Secondary | ICD-10-CM | POA: Insufficient documentation

## 2016-11-23 DIAGNOSIS — N809 Endometriosis, unspecified: Secondary | ICD-10-CM | POA: Insufficient documentation

## 2016-11-23 DIAGNOSIS — M1990 Unspecified osteoarthritis, unspecified site: Secondary | ICD-10-CM | POA: Diagnosis not present

## 2016-11-23 DIAGNOSIS — Z6841 Body Mass Index (BMI) 40.0 and over, adult: Secondary | ICD-10-CM | POA: Insufficient documentation

## 2016-11-23 DIAGNOSIS — Z885 Allergy status to narcotic agent status: Secondary | ICD-10-CM | POA: Insufficient documentation

## 2016-11-23 DIAGNOSIS — L97222 Non-pressure 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 exposed: Secondary | ICD-10-CM | POA: Insufficient documentation

## 2016-11-23 DIAGNOSIS — K219 Gastro-esophageal reflux disease without esophagitis: Secondary | ICD-10-CM | POA: Diagnosis not present

## 2016-11-23 DIAGNOSIS — F17218 Nicotine dependence, cigarettes, with other nicotine-induced disorders: Secondary | ICD-10-CM | POA: Insufficient documentation

## 2016-11-23 DIAGNOSIS — Z7982 Long term (current) use of aspirin: Secondary | ICD-10-CM | POA: Insufficient documentation

## 2016-11-23 DIAGNOSIS — I10 Essential (primary) hypertension: Secondary | ICD-10-CM | POA: Insufficient documentation

## 2016-11-26 NOTE — Progress Notes (Signed)
TAELA, CHARBONNEAU ([id number]) Visit Report for 11/23/2016 Abuse/Suicide Risk Screen Details Patient Name: [name], [name] Date of Service: 11/23/2016 10:30 AM Medical Record Number: [id number] Patient Account Number: [id number] Date of Birth/Sex: 1960/05/25 (56 y.o. Female) Treating RN: Carolyne Fiscal, Debi Primary Care Sully Manzi: Tracie Harrier Other Clinician: Referring Minard Millirons: Tracie Harrier Treating Breon Diss/Extender: Frann Rider in Treatment: 0 Abuse/Suicide Risk Screen Items Answer ABUSE/SUICIDE RISK SCREEN: Has anyone close to you tried to hurt or harm you recentlyo No Do you feel uncomfortable with anyone in your familyo No Has anyone forced you do things that you didnot want to doo No Do you have any thoughts of harming yourselfo No Patient displays signs or symptoms of abuse and/or neglect. No Electronic Signature(s) Signed: 11/24/2016 4:36:47 PM By: Alric Quan Entered By: Alric Quan on 11/23/2016 10:59:07 Reade, Kiaja R. ([id number]) -------------------------------------------------------------------------------- Activities of Daily Living Details Patient Name: [name], [name] Date of Service: 11/23/2016 10:30 AM Medical Record Number: [id number] Patient Account Number: [id number] Date of Birth/Sex: 20-Jan-1960 (56 y.o. Female) Treating RN: Ahmed Prima Primary Care Myeasha Ballowe: Tracie Harrier Other Clinician: Referring Akeema Broder: Tracie Harrier Treating Keamber Macfadden/Extender: Frann Rider in Treatment: 0 Activities of Daily Living Items Answer Activities of Daily Living (Please select one for each item) Drive Automobile Completely Able Take Medications Completely Able Use Telephone Completely Able Care for Appearance Completely Able Use Toilet Completely Able Bath / Shower Completely Able Dress Self Completely Able Feed Self Completely Able Walk Completely Able Get In / Out Bed Completely Able Housework Completely Able Prepare Meals  Completely Able Handle Money Completely Able Shop for Self Completely Able Electronic Signature(s) Signed: 11/24/2016 4:36:47 PM By: Alric Quan Entered By: Alric Quan on 11/23/2016 10:59:25 Corbit, Leiani R. ([id number]) -------------------------------------------------------------------------------- Education Assessment Details Patient Name: [name], [name] Date of Service: 11/23/2016 10:30 AM Medical Record Number: [id number] Patient Account Number: [id number] Date of Birth/Sex: 01/25/1960 (56 y.o. Female) Treating RN: Carolyne Fiscal, Debi Primary Care Anasophia Pecor: Tracie Harrier Other Clinician: Referring Alexsys Eskin: Tracie Harrier Treating Osmani Kersten/Extender: Frann Rider in Treatment: 0 Primary Learner Assessed: Patient Learning Preferences/Education Level/Primary Language Learning Preference: Explanation, Printed Material Highest Education Level: High School Preferred Language: English Cognitive Barrier Assessment/Beliefs Language Barrier: No Translator Needed: No Memory Deficit: No Emotional Barrier: No Cultural/Religious Beliefs Affecting Medical Care: No Physical Barrier Assessment Impaired Vision: Yes Glasses Impaired Hearing: No Decreased Hand dexterity: No Knowledge/Comprehension Assessment Knowledge Level: Medium Comprehension Level: Medium Ability to understand written Medium instructions: Ability to understand verbal Medium instructions: Motivation Assessment Anxiety Level: Calm Cooperation: Cooperative Education Importance: Acknowledges Need Interest in Health Problems: Asks Questions Perception: Coherent Willingness to Engage in Self- Medium Management Activities: Readiness to Engage in Self- Medium Management Activities: Electronic Signature(s) Signed: 11/24/2016 4:36:47 PM By: Alric Quan Entered By: Alric Quan on 11/23/2016 10:59:45 Googe, Jessilyn R.  ([id number]) -------------------------------------------------------------------------------- Fall Risk Assessment Details Patient Name: [name], [name] Date of Service: 11/23/2016 10:30 AM Medical Record Number: [id number] Patient Account Number: [id number] Date of Birth/Sex: 1960/07/31 (56 y.o. Female) Treating RN: Carolyne Fiscal, Debi Primary Care Jenniah Bhavsar: Tracie Harrier Other Clinician: Referring Izea Livolsi: Tracie Harrier Treating Amaan Meyer/Extender: Frann Rider in Treatment: 0 Fall Risk Assessment Items Have you had 2 or more falls in the last 12 monthso 0 Yes Have you had any fall that resulted in injury in the last 12 monthso 0 Yes FALL RISK ASSESSMENT: History of falling - immediate or within 3 months 25 Yes Secondary diagnosis 15 Yes Ambulatory aid None/bed rest/wheelchair/nurse 0 No Crutches/cane/walker 0 No Furniture 0 No IV  Access/Saline Lock 0 No Gait/Training Normal/bed rest/immobile 0 No Weak 0 No Impaired 0 No Mental Status Oriented to own ability 0 Yes Electronic Signature(s) Signed: 11/24/2016 4:36:47 PM By: Alric Quan Entered By: Alric Quan on 11/23/2016 11:00:12 Christner, Shanequa R. ([id number]) -------------------------------------------------------------------------------- Foot Assessment Details Patient Name: [name], [name] Date of Service: 11/23/2016 10:30 AM Medical Record Number: [id number] Patient Account Number: [id number] Date of Birth/Sex: 1960-12-16 (56 y.o. Female) Treating RN: Carolyne Fiscal, Debi Primary Care Adonis Yim: Tracie Harrier Other Clinician: Referring Lachanda Buczek: Tracie Harrier Treating Neytiri Asche/Extender: Frann Rider in Treatment: 0 Foot Assessment Items Site Locations + = Sensation present, - = Sensation absent, C = Callus, U = Ulcer R = Redness, W = Warmth, M = Maceration, PU = Pre-ulcerative lesion F = Fissure, S = Swelling, D = Dryness Assessment Right: Left: Other Deformity: No No Prior Foot Ulcer: No  No Prior Amputation: No No Charcot Joint: No No Ambulatory Status: Ambulatory Without Help Gait: Steady Electronic Signature(s) Signed: 11/24/2016 4:36:47 PM By: Alric Quan Entered By: Alric Quan on 11/23/2016 11:02:57 [name], [name] ([id number]) -------------------------------------------------------------------------------- Nutrition Risk Assessment Details Patient Name: [name], [name] Date of Service: 11/23/2016 10:30 AM Medical Record Number: [id number] Patient Account Number: [id number] Date of Birth/Sex: December 28, 1960 (56 y.o. Female) Treating RN: Carolyne Fiscal, Debi Primary Care Seline Enzor: Tracie Harrier Other Clinician: Referring Ayari Liwanag: Tracie Harrier Treating Stevi Hollinshead/Extender: Frann Rider in Treatment: 0 Height (in): 65 Weight (lbs): 294.4 Body Mass Index (BMI): 49 Nutrition Risk Assessment Items NUTRITION RISK SCREEN: I have an illness or condition that made me change the kind and/or amount of 2 Yes food I eat I eat fewer than two meals per day 3 Yes I eat few fruits and vegetables, or milk products 0 No I have three or more drinks of beer, liquor or wine almost every day 0 No I have tooth or mouth problems that make it hard for me to eat 0 No I don't always have enough money to buy the food I need 0 No I eat alone most of the time 0 No I take three or more different prescribed or over-the-counter drugs a day 1 Yes Without wanting to, I have lost or gained 10 pounds in the last six months 0 No I am not always physically able to shop, cook and/or feed myself 0 No Nutrition Protocols Good Risk Protocol Moderate Risk Protocol Provide education on High Risk Proctocol 0 Electronic Signature(s) Signed: 11/24/2016 4:36:47 PM By: Alric Quan Entered By: Alric Quan on 11/23/2016 11:00:28

## 2016-11-26 NOTE — Progress Notes (Signed)
[name], [name] ([id number]) Visit Report for 11/23/2016 Chief Complaint Document Details Patient Name: [name], [name] Date of Service: 11/23/2016 10:30 AM Medical Record Number: [id number] Patient Account Number: [id number] Date of Birth/Sex: [birth year] (56 y.o. Female) Treating RN: Ahmed Prima Primary Care Provider: Tracie Harrier Other Clinician: Referring Provider: Tracie Harrier Treating Provider/Extender: Frann Rider in Treatment: 0 Information Obtained from: Patient Chief Complaint Patient presents to the wound care center with open non-healing surgical wound(s) to the left lower leg which she's had for about a month Electronic Signature(s) Signed: 11/23/2016 11:38:53 AM By: Christin Fudge MD, FACS Entered By: Christin Fudge on 11/23/2016 11:38:53 [name], [name] ([id number]) -------------------------------------------------------------------------------- Debridement Details Patient Name: [name], [name] Date of Service: 11/23/2016 10:30 AM Medical Record Number: [id number] Patient Account Number: [id number] Date of Birth/Sex: 1960-08-12 (56 y.o. Female) Treating RN: Carolyne Fiscal, Debi Primary Care Provider: Tracie Harrier Other Clinician: Referring Provider: Tracie Harrier Treating Provider/Extender: Frann Rider in Treatment: 0 Debridement Performed for Wound #1 Left,Lateral Lower Leg Assessment: Performed By: Physician Christin Fudge, MD Debridement: Debridement Severity of Tissue Pre Fat layer exposed Debridement: Pre-procedure Verification/Time Yes - 11:26 Out Taken: Start Time: 11:27 Pain Control: Lidocaine 4% Topical Solution Level: Skin/Subcutaneous Tissue Total Area Debrided (L x W): 4.7 (cm) x 2 (cm) = 9.4 (cm) Tissue and other material Viable, Non-Viable, Exudate, Fibrin/Slough, Subcutaneous debrided: Instrument: Curette Bleeding: Minimum Hemostasis Achieved: Pressure End Time: 11:29 Procedural Pain: 4 Post Procedural Pain:  4 Response to Treatment: Procedure was tolerated well Post Debridement Measurements of Total Wound Length: (cm) 4.7 Width: (cm) 2.1 Depth: (cm) 0.2 Volume: (cm) 1.55 Character of Wound/Ulcer Post Debridement: Requires Further Debridement Severity of Tissue Post Debridement: Fat layer exposed Post Procedure Diagnosis Same as Pre-procedure Electronic Signature(s) Signed: 11/23/2016 11:38:27 AM By: Christin Fudge MD, FACS Signed: 11/24/2016 4:36:47 PM By: Alric Quan Entered By: Christin Fudge on 11/23/2016 11:38:27 [name], [name] ([id number]) -------------------------------------------------------------------------------- HPI Details Patient Name: [name], [name] Date of Service: 11/23/2016 10:30 AM Medical Record Number: [id number] Patient Account Number: [id number] Date of Birth/Sex: 27-Aug-1960 (56 y.o. Female) Treating RN: Carolyne Fiscal, Debi Primary Care Provider: Tracie Harrier Other Clinician: Referring Provider: Tracie Harrier Treating Provider/Extender: Frann Rider in Treatment: 0 History of Present Illness Location: Patient presents with a wound to left lower leg. Quality: Patient reports experiencing a sharp pain to affected area(s). Severity: Patient states wound are getting worse. Duration: Patient has had the wound for < 4 weeks prior to presenting for treatment Timing: Pain in wound is Intermittent (comes and goes Context: The wound occurred when the patient had a laceration with a sharp object Modifying Factors: Other treatment(s) tried include:suturing done in the ER and sutures removed on 2 weeks Associated Signs and Symptoms: Patient reports having increase discharge. HPI Description: 56 year old patient seen by her PCP Dr. Ginette Pitman, for a follow-up of her left leg lacerated wound had sutures removed a week before she was seen on 11/15/2016. there was swelling and erythema and the edges of the laceration had not completely healed and she had completed a  course of doxycycline. She had quit smoking about a year ago. Past medical history significant for anxiety states, asthma, endometriosis, GERD, obesity, sleep apnea, status post appendectomy, cardiac catheterization, cholecystectomy, multiple colonoscopies and EGDs, hysterectomy, knee arthroscopies. she recently restarted smoking after quitting for over 20 years Electronic Signature(s) Signed: 11/23/2016 11:40:05 AM By: Christin Fudge MD, FACS Previous Signature: 11/23/2016 11:06:53 AM Version By: Christin Fudge MD, FACS Entered By: Christin Fudge on 11/23/2016 11:40:05  [name], [name] ([id number]) -------------------------------------------------------------------------------- Physical Exam Details Patient Name: [name], [name] Date of Service: 11/23/2016 10:30 AM Medical Record Number: [id number] Patient Account Number: [id number] Date of Birth/Sex: 03-25-1960 (56 y.o. Female) Treating RN: Ahmed Prima Primary Care Provider: Tracie Harrier Other Clinician: Referring Provider: Tracie Harrier Treating Provider/Extender: Frann Rider in Treatment: 0 Constitutional . Pulse regular. Respirations normal and unlabored. Afebrile. . Eyes Nonicteric. Reactive to light. Ears, Nose, Mouth, and Throat Lips, teeth, and gums WNL.Marland Kitchen Moist mucosa without lesions. Neck supple and nontender. No palpable supraclavicular or cervical adenopathy. Normal sized without goiter. Respiratory WNL. No retractions.. Cardiovascular Pedal Pulses WNL. ABI on the left was 1.3. No clubbing, cyanosis or edema. Gastrointestinal (GI) Abdomen without masses or tenderness.. No liver or spleen enlargement or tenderness.. Lymphatic No adneopathy. No adenopathy. No adenopathy. Musculoskeletal Adexa without tenderness or enlargement.. Digits and nails w/o clubbing, cyanosis, infection, petechiae, ischemia, or inflammatory conditions.. Integumentary (Hair, Skin) No suspicious lesions. No crepitus or fluctuance.  No peri-wound warmth or erythema. No masses.Marland Kitchen Psychiatric Judgement and insight Intact.. No evidence of depression, anxiety, or agitation.. Notes the patient had a V-shaped injury to the left lateral anterior calf where previous suturing was done and now there is necrotic debris and some subcutaneous granulation tissue which needs cleaning with a #3 curet and brisk bleeding controlled with pressure Electronic Signature(s) Signed: 11/23/2016 11:40:58 AM By: Christin Fudge MD, FACS Entered By: Christin Fudge on 11/23/2016 11:40:57 [name], [name] ([id number]) -------------------------------------------------------------------------------- Physician Orders Details Patient Name: Tenbrink, Royale R. Date of Service: 11/23/2016 10:30 AM Medical Record Number: [id number] Patient Account Number: [id number] Date of Birth/Sex: [birth year] (56 y.o. Female) Treating RN: Carolyne Fiscal, Debi Primary Care Provider: Tracie Harrier Other Clinician: Referring Provider: Tracie Harrier Treating Provider/Extender: Frann Rider in Treatment: 0 Verbal / Phone Orders: Yes Clinician: Pinkerton, Debi Read Back and Verified: Yes Diagnosis Coding Wound Cleansing Wound #1 Left,Lateral Lower Leg o Clean wound with Normal Saline. o Cleanse wound with mild soap and water o May Shower, gently pat wound dry prior to applying new dressing. o May shower with protection. Anesthetic Wound #1 Left,Lateral Lower Leg o Topical Lidocaine 4% cream applied to wound bed prior to debridement Skin Barriers/Peri-Wound Care Wound #1 Left,Lateral Lower Leg o Skin Prep Primary Wound Dressing Wound #1 Left,Lateral Lower Leg o Silvercel Non-Adherent Secondary Dressing Wound #1 Left,Lateral Lower Leg o Dry Gauze o Other - telfa island Dressing Change Frequency Wound #1 Left,Lateral Lower Leg o Change dressing every day. Follow-up Appointments Wound #1 Left,Lateral Lower Leg o Return Appointment in 1  week. Edema Control Wound #1 Left,Lateral Lower Leg o Elevate legs to the level of the heart and pump ankles as often as possible Additional Orders / Instructions Wound #1 Left,Lateral Lower Leg o Stop Smoking o Increase protein intake. [name], [name] ([id number]) Medications-please add to medication list. Wound #1 Left,Lateral Lower Leg o Other: - Vitamin C, Vitamin A, Zinc Electronic Signature(s) Signed: 11/23/2016 12:03:11 PM By: Christin Fudge MD, FACS Signed: 11/24/2016 4:36:47 PM By: Alric Quan Entered By: Alric Quan on 11/23/2016 11:32:19 [name], [name] ([id number]) -------------------------------------------------------------------------------- Problem List Details Patient Name: Takeshita, Aasha R. Date of Service: 11/23/2016 10:30 AM Medical Record Number: [id number] Patient Account Number: [id number] Date of Birth/Sex: November 26, 1960 (56 y.o. Female) Treating RN: Ahmed Prima Primary Care Provider: Tracie Harrier Other Clinician: Referring Provider: Tracie Harrier Treating Provider/Extender: Frann Rider in Treatment: 0 Active Problems ICD-10 Encounter Code Description Active Date Diagnosis L97.222 Non-pressure chronic ulcer of left calf with  fat layer exposed 11/23/2016 Yes S81.812A Laceration without foreign body, left lower leg, initial encounter 11/23/2016 Yes E66.01 Morbid (severe) obesity due to excess calories 11/23/2016 Yes F17.218 Nicotine dependence, cigarettes, with other nicotine-induced 11/23/2016 Yes disorders Inactive Problems Resolved Problems Electronic Signature(s) Signed: 11/23/2016 11:36:14 AM By: Christin Fudge MD, FACS Entered By: Christin Fudge on 11/23/2016 11:36:13 [name], [name] ([id number]) -------------------------------------------------------------------------------- Progress Note Details Patient Name: Dominique, Anara R. Date of Service: 11/23/2016 10:30 AM Medical Record Number: [id number] Patient Account Number:  [id number] Date of Birth/Sex: 03/10/1960 (56 y.o. Female) Treating RN: Ahmed Prima Primary Care Provider: Tracie Harrier Other Clinician: Referring Provider: Tracie Harrier Treating Provider/Extender: Frann Rider in Treatment: 0 Subjective Chief Complaint Information obtained from Patient Patient presents to the wound care center with open non-healing surgical wound(s) to the left lower leg which she's had for about a month History of Present Illness (HPI) The following HPI elements were documented for the patient's wound: Location: Patient presents with a wound to left lower leg. Quality: Patient reports experiencing a sharp pain to affected area(s). Severity: Patient states wound are getting worse. Duration: Patient has had the wound for < 4 weeks prior to presenting for treatment Timing: Pain in wound is Intermittent (comes and goes Context: The wound occurred when the patient had a laceration with a sharp object Modifying Factors: Other treatment(s) tried include:suturing done in the ER and sutures removed on 2 weeks Associated Signs and Symptoms: Patient reports having increase discharge. 56 year old patient seen by her PCP Dr. Ginette Pitman, for a follow-up of her left leg lacerated wound had sutures removed a week before she was seen on 11/15/2016. there was swelling and erythema and the edges of the laceration had not completely healed and she had completed a course of doxycycline. She had quit smoking about a year ago. Past medical history significant for anxiety states, asthma, endometriosis, GERD, obesity, sleep apnea, status post appendectomy, cardiac catheterization, cholecystectomy, multiple colonoscopies and EGDs, hysterectomy, knee arthroscopies. she recently restarted smoking after quitting for over 20 years Wound History Patient presents with 1 open wound that has been present for approximately 4 weeks. Patient has been treating wound in the following manner:  saline and neosporin. Laboratory tests have not been performed in the last month. Patient reportedly has not tested positive for an antibiotic resistant organism. Patient reportedly has not tested positive for osteomyelitis. Patient reportedly has had testing performed to evaluate circulation in the legs. Patient experiences the following problems associated with their wounds: swelling. Patient History Information obtained from Patient. Allergies Ceftin, myocin, penicillin, sulfur, Darvocet-N, Percocet, red dye, shell fish, Iodinated Contrast- Oral and IV Dye, Phenergan, saccharin, Aminoglycosides, latex, influenza vaccines, Cefotan, cefuroxime Family History Cancer - Father, Diabetes - Mother,Siblings, Heart Disease - Mother,Siblings, Hypertension - Mother,Father,Siblings, Lung Disease - Mother,Father,Siblings, Stroke - Mother,Father, Thyroid Problems - Father,Siblings, No family history of Hereditary Spherocytosis, Kidney Disease, Seizures, Tuberculosis. Social History Betten, [name] ([id number]) Current every day smoker - 3 cigarettes a day, Marital Status - Married, Alcohol Use - Never, Drug Use - No History, Caffeine Use - Daily. Medical History Respiratory Patient has history of Asthma, Sleep Apnea Cardiovascular Patient has history of Hypertension Musculoskeletal Patient has history of Osteoarthritis Review of Systems (ROS) Constitutional Symptoms (General Health) The patient has no complaints or symptoms. Eyes Complains or has symptoms of Glasses / Contacts. Ear/Nose/Mouth/Throat The patient has no complaints or symptoms. Hematologic/Lymphatic The patient has no complaints or symptoms. Cardiovascular Complains or has symptoms of Chest pain, palpatations Gastrointestinal peptic  ulcer disease gerd IBS diverticulosis Endocrine The patient has no complaints or symptoms. Genitourinary The patient has no complaints or symptoms, endometriosis Immunological The patient has  no complaints or symptoms. Integumentary (Skin) The patient has no complaints or symptoms. Musculoskeletal obesity Neurologic fibromyalgia migraines Oncologic The patient has no complaints or symptoms. Psychiatric Complains or has symptoms of Anxiety, depression Medications buspirone 30 mg tablet oral tablet oral topiramate 200 mg tablet oral tablet oral cetirizine 10 mg tablet oral tablet oral rosuvastatin 5 mg tablet oral tablet oral ipratropium-albuterol 0.5 mg-3 mg(2.5 mg base)/3 mL nebulization soln inhalation solution for nebulization inhalation Advair Diskus 250 mcg-50 mcg/dose powder for inhalation inhalation blister with device inhalation metoprolol succinate ER 25 mg tablet,extended release 24 hr oral tablet extended release 24 hr oral amlodipine 5 mg tablet oral tablet oral olopatadine 0.2 % eye drops ophthalmic (eye) drops ophthalmic (eye) montelukast 10 mg tablet oral tablet oral Multiple Vitamins tablet oral tablet oral mometasone 50 mcg/actuation nasal spray nasal spray,non-aerosol nasal [name], [name] ([id number]) aspirin 81 mg chewable tablet oral tablet,chewable oral omeprazole 20 mg capsule,delayed release oral capsule,delayed release(DR/EC) oral Anusol-HC 25 mg rectal suppository rectal suppository rectal trazodone 100 mg tablet oral tablet oral tizanidine 2 mg capsule oral capsule oral Nitrostat 0.4 mg sublingual tablet sublingual tablet, sublingual sublingual Objective Constitutional Pulse regular. Respirations normal and unlabored. Afebrile. Vitals Time Taken: 10:44 AM, Height: 65 in, Source: Stated, Weight: 294.4 lbs, Source: Measured, BMI: 49, Temperature: 98.2 F, Pulse: 68 bpm, Respiratory Rate: 20 breaths/min, Blood Pressure: 138/72 mmHg. Eyes Nonicteric. Reactive to light. Ears, Nose, Mouth, and Throat Lips, teeth, and gums WNL.Marland Kitchen Moist mucosa without lesions. Neck supple and nontender. No palpable supraclavicular or cervical adenopathy. Normal  sized without goiter. Respiratory WNL. No retractions.. Cardiovascular Pedal Pulses WNL. ABI on the left was 1.3. No clubbing, cyanosis or edema. Gastrointestinal (GI) Abdomen without masses or tenderness.. No liver or spleen enlargement or tenderness.. Lymphatic No adneopathy. No adenopathy. No adenopathy. Musculoskeletal Adexa without tenderness or enlargement.. Digits and nails w/o clubbing, cyanosis, infection, petechiae, ischemia, or inflammatory conditions.Marland Kitchen Psychiatric Judgement and insight Intact.. No evidence of depression, anxiety, or agitation.. General Notes: the patient had a V-shaped injury to the left lateral anterior calf where previous suturing was done and now there is necrotic debris and some subcutaneous granulation tissue which needs cleaning with a #3 curet and brisk bleeding controlled with pressure Integumentary (Hair, Skin) No suspicious lesions. No crepitus or fluctuance. No peri-wound warmth or erythema. No masses.. Wound #1 status is Open. Original cause of wound was Trauma. The wound is located on the Left,Lateral Lower Leg. The wound measures 4.7cm length x 2cm width x 0.1cm depth; 7.383cm^2 area and 0.738cm^3 volume. There is no tunneling or undermining noted. There is a large amount of serosanguineous drainage noted. The wound margin is distinct with the outline Borland, Xoe R. ([id number]) attached to the wound base. There is medium (34-66%) red granulation within the wound bed. There is a medium (34-66%) amount of necrotic tissue within the wound bed including Adherent Slough. The periwound skin appearance exhibited: Scarring, Maceration. Periwound temperature was noted as No Abnormality. The periwound has tenderness on palpation. Assessment Active Problems ICD-10 L97.222 - Non-pressure 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 exposed S81.812A - Laceration without foreign body, left lower leg, initial encounter E66.01 - Morbid (severe) obesity due to  excess calories F17.218 - Nicotine dependence, cigarettes, with other nicotine-induced disorders 56 year old patient who is morbidly obese but does not have diabetes and  has recently restarted smoking comes with a non- healing lacerated wound which has been present for about a month. After sharp debridement I have recommended: 1. Silver alginate and a bordered foam to be changed daily 2. Adequate protein, vitamin A, vitamin C and zinc 3. Spent 3 minutes discussing with her the need to completely give up smoking and have discussed the risks benefits alternatives and all the possible methods 4. Regular visits wound center She has had all questions answered and says she will be compliant Procedures Wound #1 Pre-procedure diagnosis of Wound #1 is an Arterial Insufficiency Ulcer located on the Left,Lateral Lower Leg .Severity of Tissue Pre Debridement is: Fat layer exposed. There was a Skin/Subcutaneous Tissue Debridement ([id number]) debridement with total area of 9.4 sq cm performed by Christin Fudge, MD. with the following instrument(s): Curette to remove Viable and Non-Viable tissue/material including Exudate, Fibrin/Slough, and Subcutaneous after achieving pain control using Lidocaine 4% Topical Solution. A time out was conducted at 11:26, prior to the start of the procedure. A Minimum amount of bleeding was controlled with Pressure. The procedure was tolerated well with a pain level of 4 throughout and a pain level of 4 following the procedure. Post Debridement Measurements: 4.7cm length x 2.1cm width x 0.2cm depth; 1.55cm^3 volume. Character of Wound/Ulcer Post Debridement requires further debridement. Severity of Tissue Post Debridement is: Fat layer exposed. Post procedure Diagnosis Wound #1: Same as Pre-Procedure Plan Wound Cleansing: Wound #1 Left,Lateral Lower Leg: Clean wound with Normal Saline. Kindall, Lakeeta R. ([id number]) Cleanse wound with mild soap and water May Shower, gently  pat wound dry prior to applying new dressing. May shower with protection. Anesthetic: Wound #1 Left,Lateral Lower Leg: Topical Lidocaine 4% cream applied to wound bed prior to debridement Skin Barriers/Peri-Wound Care: Wound #1 Left,Lateral Lower Leg: Skin Prep Primary Wound Dressing: Wound #1 Left,Lateral Lower Leg: Silvercel Non-Adherent Secondary Dressing: Wound #1 Left,Lateral Lower Leg: Dry Gauze Other - telfa island Dressing Change Frequency: Wound #1 Left,Lateral Lower Leg: Change dressing every day. Follow-up Appointments: Wound #1 Left,Lateral Lower Leg: Return Appointment in 1 week. Edema Control: Wound #1 Left,Lateral Lower Leg: Elevate legs to the level of the heart and pump ankles as often as possible Additional Orders / Instructions: Wound #1 Left,Lateral Lower Leg: Stop Smoking Increase protein intake. Medications-please add to medication list.: Wound #1 Left,Lateral Lower Leg: Other: - Vitamin C, Vitamin A, Zinc 56 year old patient who is morbidly obese but does not have diabetes and has recently restarted smoking comes with a non- healing lacerated wound which has been present for about a month. After sharp debridement I have recommended: 1. Silver alginate and a bordered foam to be changed daily 2. Adequate protein, vitamin A, vitamin C and zinc 3. Spent 3 minutes discussing with her the need to completely give up smoking and have discussed the risks benefits alternatives and all the possible methods 4. Regular visits wound center She has had all questions answered and says she will be compliant Electronic Signature(s) Signed: 11/23/2016 11:42:26 AM By: Christin Fudge MD, FACS Entered By: Christin Fudge on 11/23/2016 11:42:26 [name], [name] ([id number]) -------------------------------------------------------------------------------- ROS/PFSH Details Patient Name: [name], [name] Date of Service: 11/23/2016 10:30 AM Medical Record Number:  [id number] Patient Account Number: [id number] Date of Birth/Sex: 1960/09/19 (56 y.o. Female) Treating RN: Ahmed Prima Primary Care Provider: Tracie Harrier Other Clinician: Referring Provider: Tracie Harrier Treating Provider/Extender: Frann Rider in Treatment: 0 Information Obtained From Patient Wound History Do you currently have one or more open woundso  Yes How many open wounds do you currently haveo 1 Approximately how long have you had your woundso 4 weeks How have you been treating your wound(s) until nowo saline and neosporin Has your wound(s) ever healed and then re-openedo No Have you had any lab work done in the past montho No Have you tested positive for an antibiotic resistant organism (MRSA, VRE)o No Have you tested positive for osteomyelitis (bone infection)o No Have you had any tests for circulation on your legso Yes Who ordered the testo dr. dew [facility] the test done[facility] Have you had other problems associated with your woundso Swelling Eyes Complaints and Symptoms: Positive for: Glasses / Contacts Cardiovascular Complaints and Symptoms: Positive for: Chest pain Review of System Notes: palpatations Medical History: Positive for: Hypertension Psychiatric Complaints and Symptoms: Positive for: Anxiety Review of System Notes: depression Constitutional Symptoms (General Health) Complaints and Symptoms: No Complaints or Symptoms Ear/Nose/Mouth/Throat Complaints and Symptoms: No Complaints or Symptoms [name], [name] R. ([id number]) Hematologic/Lymphatic Complaints and Symptoms: No Complaints or Symptoms Respiratory Medical History: Positive for: Asthma; Sleep Apnea Gastrointestinal Complaints and Symptoms: Review of System Notes: peptic ulcer disease gerd IBS diverticulosis Endocrine Complaints and Symptoms: No Complaints or Symptoms Genitourinary Complaints and Symptoms: No Complaints or Symptoms Complaints and  Symptoms: Review of System Notes: endometriosis Immunological Complaints and Symptoms: No Complaints or Symptoms Integumentary (Skin) Complaints and Symptoms: No Complaints or Symptoms Musculoskeletal Complaints and Symptoms: Review of System Notes: obesity Medical History: Positive for: Osteoarthritis Neurologic Complaints and Symptoms: Review of System Notes: fibromyalgia migraines Oncologic [name], [name] ([id number]) Complaints and Symptoms: No Complaints or Symptoms Immunizations Pneumococcal Vaccine: Received Pneumococcal Vaccination: Yes Implantable Devices Family and Social History Cancer: Yes - Father; Diabetes: Yes - Mother,Siblings; Heart Disease: Yes - Mother,Siblings; Hereditary Spherocytosis: No; Hypertension: Yes - Mother,Father,Siblings; Kidney Disease: No; Lung Disease: Yes - Mother,Father,Siblings; Seizures: No; Stroke: Yes - Mother,Father; Thyroid Problems: Yes - Father,Siblings; Tuberculosis: No; Current every day smoker - 3 cigarettes a day; Marital Status - Married; Alcohol Use: Never; Drug Use: No History; Caffeine Use: Daily; Financial Concerns: No; Food, Clothing or Shelter Needs: No; Support System Lacking: No; Transportation Concerns: No; Advanced Directives: No; Patient does not want information on Advanced Directives; Do not resuscitate: No; Living Will: No; Medical Power of Attorney: No Physician Affirmation I have reviewed and agree with the above information. Electronic Signature(s) Signed: 11/23/2016 12:03:11 PM By: Christin Fudge MD, FACS Signed: 11/24/2016 4:36:47 PM By: Alric Quan Entered By: Christin Fudge on 11/23/2016 11:02:09 Coward, Shonique R. ([id number]) -------------------------------------------------------------------------------- SuperBill Details Patient Name: Allemand, Deryn R. Date of Service: 11/23/2016 Medical Record Number: [id number] Patient Account Number: [id number] Date of Birth/Sex: [birth year] (56 y.o.  Female) Treating RN: Carolyne Fiscal, Debi Primary Care Provider: Tracie Harrier Other Clinician: Referring Provider: Tracie Harrier Treating Provider/Extender: Frann Rider in Treatment: 0 Diagnosis Coding ICD-10 Codes Code Description [phone] Non-pressure 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 exposed S81.812A Laceration without foreign body, left lower leg, initial encounter E66.01 Morbid (severe) obesity due to excess calories F17.218 Nicotine dependence, cigarettes, with other nicotine-induced disorders Facility Procedures CPT4 Code: 28366294 Description: 99213 - WOUND CARE VISIT-LEV 3 EST PT Modifier: Quantity: 1 CPT4 Code: 76546503 Description: 11042 - DEB SUBQ TISSUE 20 SQ CM/< ICD-10 Diagnosis Description L97.222 Non-pressure 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 expos S81.812A Laceration without foreign body, left lower leg, initial enc E66.01 Morbid (severe) obesity due to  excess calories F17.218 Nicotine dependence, cigarettes, with other nicotine-induced Modifier: ed ounter disorders  Quantity: 1 CPT4 Code: 35329924 Description: 99406-SMOKING CESSATION 3-10MINS ICD-10 Diagnosis Description L97.222 Non-pressure 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 expos S81.812A Laceration without foreign body, left lower leg, initial enc F17.218 Nicotine dependence, cigarettes,  with other nicotine-induced Modifier: ed ounter disorders Quantity: 1 Physician Procedures CPT4 Code: 2683419 Description: 62229 - WC PHYS LEVEL 4 - NEW PT ICD-10 Diagnosis Description L97.222 Non-pressure 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 expos S81.812A Laceration without foreign body, left lower leg, initial enc E66.01 Morbid (severe) obesity due to  excess calories F17.218 Nicotine dependence, cigarettes, with other nicotine-induced Modifier: 25 ed ounter disorders Quantity: 1 CPT4 Code: 7989211 Harbin, Leda Description: 11042 - WC PHYS SUBQ TISS 20 SQ CM ICD-10 Diagnosis  Description L97.222 Non-pressure 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 expos S81.812A Laceration without foreign body, left lower leg, initial enc E66.01 Morbid (severe) obesity due to  excess calories F17.218 Nicotine dependence, cigarettes, with other nicotine-induced R. ([id number]) Modifier: ed ounter disorders Quantity: 1 Electronic Signature(s) Signed: 11/23/2016 11:49:35 AM By: Alric Quan Signed: 11/23/2016 12:03:11 PM By: Christin Fudge MD, FACS Previous Signature: 11/23/2016 11:43:33 AM Version By: Christin Fudge MD, FACS Previous Signature: 11/23/2016 11:42:41 AM Version By: Christin Fudge MD, FACS Entered By: Alric Quan on 11/23/2016 11:49:34

## 2016-11-27 NOTE — Progress Notes (Signed)
BITA, CARTWRIGHT ([id number]) Visit Report for 11/23/2016 Allergy List Details Patient Name: [name], [name] Date of Service: 11/23/2016 10:30 AM Medical Record Number: [id number] Patient Account Number: [id number] Date of Birth/Sex: 10/30/1960 (56 y.o. Female) Treating RN: Ahmed Prima Primary Care Milana Salay: Tracie Harrier Other Clinician: Referring Irina Okelly: Tracie Harrier Treating Severus Brodzinski/Extender: Frann Rider in Treatment: 0 Allergies Active Allergies Ceftin myocin penicillin sulfur Darvocet-N Percocet red dye shell fish Iodinated Contrast- Oral and IV Dye Phenergan saccharin Aminoglycosides latex influenza vaccines Cefotan cefuroxime Allergy Notes Electronic Signature(s) Signed: 11/24/2016 4:36:47 PM By: Alric Quan Entered By: Alric Quan on 11/23/2016 10:49:50 [name], [name] ([id number]) [name], [name] ([id number]) -------------------------------------------------------------------------------- Arrival Information Details Patient Name: [name], [name] Date of Service: 11/23/2016 10:30 AM Medical Record Number: [id number] Patient Account Number: [id number] Date of Birth/Sex: [birth year] (56 y.o. Female) Treating RN: Carolyne Fiscal, Debi Primary Care Dillon Mcreynolds: Tracie Harrier Other Clinician: Referring Sada Mazzoni: Tracie Harrier Treating Marianita Botkin/Extender: Frann Rider in Treatment: 0 Visit Information Patient Arrived: Ambulatory Arrival Time: 10:42 Accompanied By: self Transfer Assistance: None Patient Identification Verified: Yes Secondary Verification Process Completed: Yes Patient Requires Transmission-Based No Precautions: Patient Has Alerts: No Electronic Signature(s) Signed: 11/24/2016 4:36:47 PM By: Alric Quan Entered By: Alric Quan on 11/23/2016 10:44:39 [name], [name] ([id number]) -------------------------------------------------------------------------------- Clinic Level of Care Assessment  Details Patient Name: [name], [name] Date of Service: 11/23/2016 10:30 AM Medical Record Number: [id number] Patient Account Number: [id number] Date of Birth/Sex: [birth year] (56 y.o. Female) Treating RN: Carolyne Fiscal, Debi Primary Care Kaileena Obi: Tracie Harrier Other Clinician: Referring Odas Ozer: Tracie Harrier Treating Darrius Montano/Extender: Frann Rider in Treatment: 0 Clinic Level of Care Assessment Items TOOL 1 Quantity Score X - Use when EandM and Procedure is performed on INITIAL visit 1 0 ASSESSMENTS - Nursing Assessment / Reassessment X - General Physical Exam (combine w/ comprehensive assessment (listed just below) when 1 20 performed on new pt. evals) X- 1 25 Comprehensive Assessment (HX, ROS, Risk Assessments, Wounds Hx, etc.) ASSESSMENTS - Wound and Skin Assessment / Reassessment []  - Dermatologic / Skin Assessment (not related to wound area) 0 ASSESSMENTS - Ostomy and/or Continence Assessment and Care []  - Incontinence Assessment and Management 0 []  - 0 Ostomy Care Assessment and Management (repouching, etc.) PROCESS - Coordination of Care X - Simple Patient / Family Education for ongoing care 1 15 []  - 0 Complex (extensive) Patient / Family Education for ongoing care []  - 0 Staff obtains Programmer, systems, Records, Test Results / Process Orders []  - 0 Staff telephones HHA, Nursing Homes / Clarify orders / etc []  - 0 Routine Transfer to another Facility (non-emergent condition) []  - 0 Routine Hospital Admission (non-emergent condition) X- 1 15 New Admissions / Biomedical engineer / Ordering NPWT, Apligraf, etc. []  - 0 Emergency Hospital Admission (emergent condition) PROCESS - Special Needs []  - Pediatric / Minor Patient Management 0 []  - 0 Isolation Patient Management []  - 0 Hearing / Language / Visual special needs []  - 0 Assessment of Community assistance (transportation, D/C planning, etc.) []  - 0 Additional assistance / Altered mentation []  -  0 Support Surface(s) Assessment (bed, cushion, seat, etc.) [name], [name] ([id number]) INTERVENTIONS - Miscellaneous []  - External ear exam 0 []  - 0 Patient Transfer (multiple staff / Civil Service fast streamer / Similar devices) []  - 0 Simple Staple / Suture removal (25 or less) []  - 0 Complex Staple / Suture removal (26 or more) []  - 0 Hypo/Hyperglycemic Management (do not check if billed separately) X- 1 15 Ankle / Brachial Index (ABI) -  do not check if billed separately Has the patient been seen at the hospital within the last three years: Yes Total Score: 90 Level Of Care: New/Established - Level 3 Electronic Signature(s) Signed: 11/24/2016 4:36:47 PM By: Alric Quan Entered By: Alric Quan on 11/23/2016 11:49:17 [name], [name] ([id number]) -------------------------------------------------------------------------------- Encounter Discharge Information Details Patient Name: [name], [name] Date of Service: 11/23/2016 10:30 AM Medical Record Number: [id number] Patient Account Number: [id number] Date of Birth/Sex: [birth year] (56 y.o. Female) Treating RN: Carolyne Fiscal, Debi Primary Care Arisa Congleton: Tracie Harrier Other Clinician: Referring Sherre Wooton: Tracie Harrier Treating Soraya Paquette/Extender: Frann Rider in Treatment: 0 Encounter Discharge Information Items Discharge Pain Level: 4 Discharge Condition: Stable Ambulatory Status: Ambulatory Discharge Destination: Home Transportation: Private Auto Accompanied By: sister Schedule Follow-up Appointment: Yes Medication Reconciliation completed and No provided to Patient/Care Yanice Maqueda: Provided on Clinical Summary of Care: 11/23/2016 Form Type Recipient Paper Patient RD Electronic Signature(s) Signed: 11/27/2016 4:27:54 PM By: Ruthine Dose Entered By: Ruthine Dose on 11/23/2016 11:32:52 [name], [name] ([id number]) -------------------------------------------------------------------------------- Lower Extremity  Assessment Details Patient Name: [name], [name] Date of Service: 11/23/2016 10:30 AM Medical Record Number: [id number] Patient Account Number: [id number] Date of Birth/Sex: [birth year] (56 y.o. Female) Treating RN: Carolyne Fiscal, Debi Primary Care Maejor Erven: Tracie Harrier Other Clinician: Referring Zamarian Scarano: Tracie Harrier Treating Omer Monter/Extender: Frann Rider in Treatment: 0 Edema Assessment Assessed: [Left: No] [Right: No] Edema: [Left: Ye] [Right: s] Calf Left: Right: Point of Measurement: 30 cm From Medial Instep 49.5 cm cm Ankle Left: Right: Point of Measurement: 9 cm From Medial Instep 28.8 cm cm Vascular Assessment Pulses: Dorsalis Pedis Palpable: [Left:No] Doppler Audible: [Left:Yes] Posterior Tibial Palpable: [Left:No] Doppler Audible: [Left:Yes] Extremity colors, hair growth, and conditions: Extremity Color: [Left:Mottled] Temperature of Extremity: [Left:Cool] Capillary Refill: [Left:< 3 seconds] Blood Pressure: Brachial: [Left:138] Dorsalis Pedis: 170 [Left:Dorsalis Pedis:] Ankle: Posterior Tibial: 180 [Left:Posterior Tibial: 1.30] Toe Nail Assessment Left: Right: Thick: No Discolored: No Deformed: No Improper Length and Hygiene: No Electronic Signature(s) Signed: 11/24/2016 4:36:47 PM By: Alric Quan Entered By: Alric Quan on 11/23/2016 11:12:46 [name], [name] ([id number]) -------------------------------------------------------------------------------- Multi Wound Chart Details Patient Name: Behnken, Jesseca R. Date of Service: 11/23/2016 10:30 AM Medical Record Number: [id number] Patient Account Number: [id number] Date of Birth/Sex: 1960-02-19 (56 y.o. Female) Treating RN: Ahmed Prima Primary Care Antara Brecheisen: Tracie Harrier Other Clinician: Referring Debbora Ang: Tracie Harrier Treating Kenzli Barritt/Extender: Frann Rider in Treatment: 0 Vital Signs Height(in): 65 Pulse(bpm): 27 Weight(lbs): 294.4 Blood Pressure(mmHg):  138/72 Body Mass Index(BMI): 49 Temperature(F): 98.2 Respiratory Rate 20 (breaths/min): Photos: [1:No Photos] [N/A:N/A] Wound Location: [1:Left Lower Leg - Lateral] [N/A:N/A] Wounding Event: [1:Trauma] [N/A:N/A] Primary Etiology: [1:Arterial Insufficiency Ulcer] [N/A:N/A] Secondary Etiology: [1:Trauma, Other] [N/A:N/A] Comorbid History: [1:Asthma, Sleep Apnea, Hypertension, Osteoarthritis] [N/A:N/A] Date Acquired: [1:10/26/2016] [N/A:N/A] Weeks of Treatment: [1:0] [N/A:N/A] Wound Status: [1:Open] [N/A:N/A] Measurements L x W x D [1:4.7x2x0.1] [N/A:N/A] (cm) Area (cm) : [1:7.383] [N/A:N/A] Volume (cm) : [1:0.738] [N/A:N/A] Classification: [1:Partial Thickness] [N/A:N/A] Exudate Amount: [1:Large] [N/A:N/A] Exudate Type: [1:Serosanguineous] [N/A:N/A] Exudate Color: [1:red, brown] [N/A:N/A] Wound Margin: [1:Distinct, outline attached] [N/A:N/A] Granulation Amount: [1:Medium (34-66%)] [N/A:N/A] Granulation Quality: [1:Red] [N/A:N/A] Necrotic Amount: [1:Medium (34-66%)] [N/A:N/A] Epithelialization: [1:None] [N/A:N/A] Debridement: [1:Debridement ([id number])] [N/A:N/A] Pre-procedure [1:11:26] [N/A:N/A] Verification/Time Out Taken: Pain Control: [1:Lidocaine 4% Topical Solution] [N/A:N/A] Tissue Debrided: [1:Fibrin/Slough, Exudates, Subcutaneous] [N/A:N/A] Level: [1:Skin/Subcutaneous Tissue] [N/A:N/A] Debridement Area (sq cm): [1:9.4] [N/A:N/A] Instrument: [1:Curette] [N/A:N/A] Bleeding: [1:Minimum] [N/A:N/A] Hemostasis Achieved: [1:Pressure] [N/A:N/A] Procedural Pain: [1:4] [N/A:N/A] Post Procedural Pain: [1:4] [N/A:N/A] Debridement Treatment Procedure was tolerated well N/A N/A Response: Post  Debridement 4.7x2.1x0.2 N/A N/A Measurements L x W x D (cm) Post Debridement Volume: 1.55 N/A N/A (cm) Periwound Skin Texture: Scarring: Yes N/A N/A Periwound Skin Moisture: Maceration: Yes N/A N/A Periwound Skin Color: No Abnormalities Noted N/A N/A Temperature: No  Abnormality N/A N/A Tenderness on Palpation: Yes N/A N/A Wound Preparation: Ulcer Cleansing: N/A N/A Rinsed/Irrigated with Saline Topical Anesthetic Applied: Other: lidocaine 4% Procedures Performed: Debridement N/A N/A Treatment Notes Electronic Signature(s) Signed: 11/23/2016 11:38:19 AM By: Christin Fudge MD, FACS Entered By: Christin Fudge on 11/23/2016 11:38:19 [name], [name] ([id number]) -------------------------------------------------------------------------------- [place] Details Patient Name: [name], [name] Date of Service: 11/23/2016 10:30 AM Medical Record Number: [id number] Patient Account Number: [id number] Date of Birth/Sex: 1960/09/07 (56 y.o. Female) Treating RN: Carolyne Fiscal, Debi Primary Care Aniesha Haughn: Tracie Harrier Other Clinician: Referring Reace Breshears: Tracie Harrier Treating Ratasha Fabre/Extender: Frann Rider in Treatment: 0 Active Inactive ` Abuse / Safety / Falls / Self Care Management Nursing Diagnoses: History of Falls Potential for falls Goals: Patient will not experience any injury related to falls Date Initiated: 11/23/2016 Target Resolution Date: 03/24/2017 Goal Status: Active Interventions: Assess Activities of Daily Living upon admission and as needed Assess fall risk on admission and as needed Assess: immobility, friction, shearing, incontinence upon admission and as needed Notes: ` Nutrition Nursing Diagnoses: Imbalanced nutrition Potential for alteratiion in Nutrition/Potential for imbalanced nutrition Goals: Patient/caregiver agrees to and verbalizes understanding of need to use nutritional supplements and/or vitamins as prescribed Date Initiated: 11/23/2016 Target Resolution Date: 03/24/2017 Goal Status: Active Interventions: Assess patient nutrition upon admission and as needed per policy Notes: ` Orientation to the Wound Care Program Nursing Diagnoses: Knowledge deficit related to the wound healing center  program Goals: Patient/caregiver will verbalize understanding of the [place], [place]. ([id number]) Date Initiated: 11/23/2016 Target Resolution Date: 12/23/2016 Goal Status: Active Interventions: Provide education on orientation to the wound center Notes: ` Pain, Acute or Chronic Nursing Diagnoses: Pain, acute or chronic: actual or potential Potential alteration in comfort, pain Goals: Patient/caregiver will verbalize adequate pain control between visits Date Initiated: 11/23/2016 Target Resolution Date: 02/24/2017 Goal Status: Active Interventions: Complete pain assessment as per visit requirements Notes: ` Wound/Skin Impairment Nursing Diagnoses: Impaired tissue integrity Knowledge deficit related to smoking impact on wound healing Knowledge deficit related to ulceration/compromised skin integrity Goals: Ulcer/skin breakdown will have a volume reduction of 80% by week 12 Date Initiated: 11/23/2016 Target Resolution Date: 03/24/2017 Goal Status: Active Interventions: Assess patient/caregiver ability to perform ulcer/skin care regimen upon admission and as needed Assess ulceration(s) every visit Provide education on smoking Notes: Electronic Signature(s) Signed: 11/24/2016 4:36:47 PM By: Alric Quan Entered By: Alric Quan on 11/23/2016 11:23:13 [name], [name] ([id number]) -------------------------------------------------------------------------------- Pain Assessment Details Patient Name: [name], Kyndra R. Date of Service: 11/23/2016 10:30 AM Medical Record Number: [id number] Patient Account Number: [id number] Date of Birth/Sex: 19-Mar-1960 (56 y.o. Female) Treating RN: Carolyne Fiscal, Debi Primary Care Jamillia Closson: Tracie Harrier Other Clinician: Referring Aitanna Haubner: Tracie Harrier Treating Monica Codd/Extender: Frann Rider in Treatment: 0 Active Problems Location of Pain Severity and Description of Pain Patient Has Paino Yes Site  Locations Rate the pain. Current Pain Level: 4 Character of Pain Describe the Pain: Burning Pain Management and Medication Current Pain Management: Electronic Signature(s) Signed: 11/24/2016 4:36:47 PM By: Alric Quan Entered By: Alric Quan on 11/23/2016 10:44:47 [name], [name] ([id number]) -------------------------------------------------------------------------------- Patient/Caregiver Education Details Patient Name: Keltner, Loeta R. Date of Service: 11/23/2016 10:30 AM Medical Record Number: [id number] Patient Account Number: [id number] Date of  Birth/Gender: September 25, 1960 (56 y.o. Female) Treating RN: Ahmed Prima Primary Care Physician: Tracie Harrier Other Clinician: Referring Physician: Tracie Harrier Treating Physician/Extender: Frann Rider in Treatment: 0 Education Assessment Education Provided To: Patient Education Topics Provided Nutrition: Handouts: Nutrition Methods: Explain/Verbal Responses: State content correctly Smoking and Wound Healing: Handouts: Smoking and Wound Healing Methods: Explain/Verbal Responses: State content correctly Welcome To The [place]: Handouts: Welcome To [facility] [place] Methods: Explain/Verbal Responses: State content correctly Wound/Skin Impairment: Handouts: Other: change dressing as ordered Methods: Demonstration, Explain/Verbal Responses: State content correctly Electronic Signature(s) Signed: 11/24/2016 4:36:47 PM By: Alric Quan Entered By: Alric Quan on 11/23/2016 11:24:36 [place], Kristen R. ([id number]) -------------------------------------------------------------------------------- Wound Assessment Details Patient Name: Calligan, Fynley R. Date of Service: 11/23/2016 10:30 AM Medical Record Number: [id number] Patient Account Number: [id number] Date of Birth/Sex: 07/09/1960 (56 y.o. Female) Treating RN: Carolyne Fiscal, Debi Primary Care Tashari Schoenfelder: Tracie Harrier Other  Clinician: Referring Dariusz Brase: Tracie Harrier Treating Dee Paden/Extender: Frann Rider in Treatment: 0 Wound Status Wound Number: 1 Primary Etiology: Arterial Insufficiency Ulcer Wound Location: Left Lower Leg - Lateral Secondary Trauma, Other Etiology: Wounding Event: Trauma Wound Status: Open Date Acquired: 10/26/2016 Comorbid History: Asthma, Sleep Apnea, Hypertension, Weeks Of Treatment: 0 Osteoarthritis Clustered Wound: No Photos Photo Uploaded By: Alric Quan on 11/23/2016 16:15:36 Wound Measurements Length: (cm) 4.7 Width: (cm) 2 Depth: (cm) 0.1 Area: (cm) 7.383 Volume: (cm) 0.738 % Reduction in Area: % Reduction in Volume: Epithelialization: None Tunneling: No Undermining: No Wound Description Classification: Partial Thickness Wound Margin: Distinct, outline attached Exudate Amount: Large Exudate Type: Serosanguineous Exudate Color: red, brown Foul Odor After Cleansing: No Slough/Fibrino Yes Wound Bed Granulation Amount: Medium (34-66%) Granulation Quality: Red Necrotic Amount: Medium (34-66%) Necrotic Quality: Adherent Slough Periwound Skin Texture Texture Color No Abnormalities Noted: No No Abnormalities Noted: No Scarring: Yes Temperature / Pain Hannold, Sweta R. ([id number]) Moisture Temperature: No Abnormality No Abnormalities Noted: No Tenderness on Palpation: Yes Maceration: Yes Wound Preparation Ulcer Cleansing: Rinsed/Irrigated with Saline Topical Anesthetic Applied: Other: lidocaine 4%, Treatment Notes Wound #1 (Left, Lateral Lower Leg) 1. Cleansed with: Clean wound with Normal Saline 2. Anesthetic Topical Lidocaine 4% cream to wound bed prior to debridement 3. Peri-wound Care: Skin Prep 4. Dressing Applied: Other dressing (specify in notes) 5. Secondary Dressing Applied Bordered Foam Dressing Dry Gauze Notes [employer] Electronic Signature(s) Signed: 11/24/2016 4:36:47 PM By: Alric Quan Entered By:  Alric Quan on 11/23/2016 [place], Ekaterina R. ([id number]) -------------------------------------------------------------------------------- Vitals Details Patient Name: Santerre, Consuelo R. Date of Service: 11/23/2016 10:30 AM Medical Record Number: [id number] Patient Account Number: [id number] Date of Birth/Sex: Aug 26, 1960 (56 y.o. Female) Treating RN: Carolyne Fiscal, Debi Primary Care Anastasija Anfinson: Tracie Harrier Other Clinician: Referring Jovonni Borquez: Tracie Harrier Treating Bula Cavalieri/Extender: Frann Rider in Treatment: 0 Vital Signs Time Taken: 10:44 Temperature (F): 98.2 Height (in): 65 Pulse (bpm): 68 Source: Stated Respiratory Rate (breaths/min): 20 Weight (lbs): 294.4 Blood Pressure (mmHg): 138/72 Source: Measured Reference Range: 80 - 120 mg / dl Body Mass Index (BMI): 49 Electronic Signature(s) Signed: 11/24/2016 4:36:47 PM By: Alric Quan Entered By: Alric Quan on 11/23/2016 10:45:35

## 2016-11-30 ENCOUNTER — Encounter: Payer: Medicare Other | Admitting: Surgery

## 2016-11-30 DIAGNOSIS — L97222 Non-pressure 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 exposed: Secondary | ICD-10-CM | POA: Diagnosis not present

## 2016-12-01 ENCOUNTER — Ambulatory Visit: Payer: Medicare Other | Admitting: Surgery

## 2016-12-01 NOTE — Progress Notes (Signed)
[name] ([id number]) Visit Report for 11/30/2016 Chief Complaint Document Details Patient Name: [name], [name] Date of Service: 11/30/2016 8:45 AM Medical Record Number: [id number] Patient Account Number: [id number] Date of Birth/Sex: [birth year] (56 y.o. Female) Treating RN: Ahmed Prima Primary Care Provider: Tracie Harrier Other Clinician: Referring Provider: Tracie Harrier Treating Provider/Extender: Frann Rider in Treatment: 1 Information Obtained from: Patient Chief Complaint Patient presents to the wound care center with open non-healing surgical wound(s) to the left lower leg which she's had for about a month Electronic Signature(s) Signed: 11/30/2016 9:22:44 AM By: Christin Fudge MD, FACS Entered By: Christin Fudge on 11/30/2016 09:22:44 Rynders, Shandel R. ([id number]) -------------------------------------------------------------------------------- Debridement Details Patient Name: [name], [name] Date of Service: 11/30/2016 8:45 AM Medical Record Number: [id number] Patient Account Number: [id number] Date of Birth/Sex: 22-Jan-1960 (56 y.o. Female) Treating RN: Carolyne Fiscal, Debi Primary Care Provider: Tracie Harrier Other Clinician: Referring Provider: Tracie Harrier Treating Provider/Extender: Frann Rider in Treatment: 1 Debridement Performed for Wound #1 Left,Lateral Lower Leg Assessment: Performed By: Physician Christin Fudge, MD Debridement: Debridement Severity of Tissue Pre Fat layer exposed Debridement: Pre-procedure Verification/Time Yes - 09:09 Out Taken: Start Time: 09:10 Pain Control: Lidocaine 4% Topical Solution Level: Skin/Subcutaneous Tissue Total Area Debrided (L x W): 4 (cm) x 3.6 (cm) = 14.4 (cm) Tissue and other material Viable, Non-Viable, Exudate, Fibrin/Slough, Subcutaneous debrided: Instrument: Curette Bleeding: Minimum Hemostasis Achieved: Pressure End Time: 09:12 Procedural Pain: 0 Post Procedural  Pain: 0 Response to Treatment: Procedure was tolerated well Post Debridement Measurements of Total Wound Length: (cm) 4 Width: (cm) 3.6 Depth: (cm) 0.2 Volume: (cm) 2.262 Character of Wound/Ulcer Post Debridement: Requires Further Debridement Severity of Tissue Post Debridement: Fat layer exposed Post Procedure Diagnosis Same as Pre-procedure Electronic Signature(s) Signed: 11/30/2016 9:22:35 AM By: Christin Fudge MD, FACS Signed: 11/30/2016 11:51:08 AM By: Alric Quan Entered By: Christin Fudge on 11/30/2016 09:22:35 Escalona, Sofie R. ([id number]) -------------------------------------------------------------------------------- HPI Details Patient Name: [name], [name] Date of Service: 11/30/2016 8:45 AM Medical Record Number: [id number] Patient Account Number: [id number] Date of Birth/Sex: 1960/11/18 (56 y.o. Female) Treating RN: Carolyne Fiscal, Debi Primary Care Provider: Tracie Harrier Other Clinician: Referring Provider: Tracie Harrier Treating Provider/Extender: Frann Rider in Treatment: 1 History of Present Illness Location: Patient presents with a wound to left lower leg. Quality: Patient reports experiencing a sharp pain to affected area(s). Severity: Patient states wound are getting worse. Duration: Patient has had the wound for < 4 weeks prior to presenting for treatment Timing: Pain in wound is Intermittent (comes and goes Context: The wound occurred when the patient had a laceration with a sharp object Modifying Factors: Other treatment(s) tried include:suturing done in the ER and sutures removed on 2 weeks Associated Signs and Symptoms: Patient reports having increase discharge. HPI Description: 56 year old patient seen by her PCP Dr. Ginette Pitman, for a follow-up of her left leg lacerated wound had sutures removed a week before she was seen on 11/15/2016. there was swelling and erythema and the edges of the laceration had not completely healed and she had  completed a course of doxycycline. She had quit smoking about a year ago. Past medical history significant for anxiety states, asthma, endometriosis, GERD, obesity, sleep apnea, status post appendectomy, cardiac catheterization, cholecystectomy, multiple colonoscopies and EGDs, hysterectomy, knee arthroscopies. she recently restarted smoking after quitting for over 20 years Electronic Signature(s) Signed: 11/30/2016 9:22:51 AM By: Christin Fudge MD, FACS Entered By: Christin Fudge on 11/30/2016 09:22:51 Lauman, Darlinda R. ([id number]) -------------------------------------------------------------------------------- Physical Exam Details Patient Name: Dawn Heckle,  [name] Date of Service: 11/30/2016 8:45 AM Medical Record Number: [id number] Patient Account Number: [id number] Date of Birth/Sex: 1960-05-30 (56 y.o. Female) Treating RN: Ahmed Prima Primary Care Provider: Tracie Harrier Other Clinician: Referring Provider: Tracie Harrier Treating Provider/Extender: Frann Rider in Treatment: 1 Constitutional . Pulse regular. Respirations normal and unlabored. Afebrile. . Eyes Nonicteric. Reactive to light. Ears, Nose, Mouth, and Throat Lips, teeth, and gums WNL.Marland Kitchen Moist mucosa without lesions. Neck supple and nontender. No palpable supraclavicular or cervical adenopathy. Normal sized without goiter. Respiratory WNL. No retractions.. Cardiovascular Pedal Pulses WNL. No clubbing, cyanosis or edema. Lymphatic No adneopathy. No adenopathy. No adenopathy. Musculoskeletal Adexa without tenderness or enlargement.. Digits and nails w/o clubbing, cyanosis, infection, petechiae, ischemia, or inflammatory conditions.. Integumentary (Hair, Skin) No suspicious lesions. No crepitus or fluctuance. No peri-wound warmth or erythema. No masses.Marland Kitchen Psychiatric Judgement and insight Intact.. No evidence of depression, anxiety, or agitation.. Notes the wound is looking fairly good but needed sharp  debridement with a #3 curet and brisk bleeding controlled with pressure Electronic Signature(s) Signed: 11/30/2016 9:23:23 AM By: Christin Fudge MD, FACS Entered By: Christin Fudge on 11/30/2016 09:23:23 Betzler, Jezabel R. ([id number]) -------------------------------------------------------------------------------- Physician Orders Details Patient Name: Dor, Denis R. Date of Service: 11/30/2016 8:45 AM Medical Record Number: [id number] Patient Account Number: [id number] Date of Birth/Sex: [birth year] (56 y.o. Female) Treating RN: Carolyne Fiscal, Debi Primary Care Provider: Tracie Harrier Other Clinician: Referring Provider: Tracie Harrier Treating Provider/Extender: Frann Rider in Treatment: 1 Verbal / Phone Orders: Yes Clinician: Pinkerton, Debi Read Back and Verified: Yes Diagnosis Coding Wound Cleansing Wound #1 Left,Lateral Lower Leg o Clean wound with Normal Saline. o Cleanse wound with mild soap and water o May Shower, gently pat wound dry prior to applying new dressing. o May shower with protection. Anesthetic Wound #1 Left,Lateral Lower Leg o Topical Lidocaine 4% cream applied to wound bed prior to debridement Skin Barriers/Peri-Wound Care Wound #1 Left,Lateral Lower Leg o Skin Prep Primary Wound Dressing Wound #1 Left,Lateral Lower Leg o Silvercel Non-Adherent Secondary Dressing Wound #1 Left,Lateral Lower Leg o Dry Gauze o Other - telfa island Dressing Change Frequency Wound #1 Left,Lateral Lower Leg o Change dressing every day. Follow-up Appointments Wound #1 Left,Lateral Lower Leg o Return Appointment in 1 week. Edema Control Wound #1 Left,Lateral Lower Leg o Elevate legs to the level of the heart and pump ankles as often as possible Additional Orders / Instructions Wound #1 Left,Lateral Lower Leg o Stop Smoking o Increase protein intake. Muha, Brynnlie R. ([id number]) Medications-please add to medication list. Wound #1  Left,Lateral Lower Leg o Other: - Vitamin C, Vitamin A, Zinc Electronic Signature(s) Signed: 11/30/2016 11:51:08 AM By: Alric Quan Signed: 11/30/2016 11:55:06 AM By: Christin Fudge MD, FACS Entered By: Alric Quan on 11/30/2016 09:08:59 Perris, Earnestene R. ([id number]) -------------------------------------------------------------------------------- Problem List Details Patient Name: Nazareno, Macey R. Date of Service: 11/30/2016 8:45 AM Medical Record Number: [id number] Patient Account Number: [id number] Date of Birth/Sex: [birth year] (56 y.o. Female) Treating RN: Carolyne Fiscal, Debi Primary Care Provider: Tracie Harrier Other Clinician: Referring Provider: Tracie Harrier Treating Provider/Extender: Frann Rider in Treatment: 1 Active Problems ICD-10 Encounter Code Description Active Date Diagnosis 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d 11/23/2016 Yes S81.812A Laceration without foreign body, left lower leg, initial encounter 11/23/2016 Yes E66.01 Morbid (severe) obesity due to excess calories 11/23/2016 Yes F17.218 Nicotine dependence, cigarettes, with other nicotine-induced 11/23/2016 Yes disorders Inactive Problems Resolved Problems Electronic Signature(s) Signed: 11/30/2016 9:22:21 AM By: Christin Fudge MD, FACS  Entered By: Christin Fudge on 11/30/2016 09:22:21 Harbold, Mehar R. ([id number]) -------------------------------------------------------------------------------- Progress Note Details Patient Name: Morell, Vihana R. Date of Service: 11/30/2016 8:45 AM Medical Record Number: [id number] Patient Account Number: [id number] Date of Birth/Sex: [birth year] (56 y.o. Female) Treating RN: Carolyne Fiscal, Debi Primary Care Provider: Tracie Harrier Other Clinician: Referring Provider: Tracie Harrier Treating Provider/Extender: Frann Rider in Treatment: 1 Subjective Chief Complaint Information obtained from Patient Patient  presents to the wound care center with open non-healing surgical wound(s) to the left lower leg which she's had for about a month History of Present Illness (HPI) The following HPI elements were documented for the patient's wound: Location: Patient presents with a wound to left lower leg. Quality: Patient reports experiencing a sharp pain to affected area(s). Severity: Patient states wound are getting worse. Duration: Patient has had the wound for < 4 weeks prior to presenting for treatment Timing: Pain in wound is Intermittent (comes and goes Context: The wound occurred when the patient had a laceration with a sharp object Modifying Factors: Other treatment(s) tried include:suturing done in the ER and sutures removed on 2 weeks Associated Signs and Symptoms: Patient reports having increase discharge. 56 year old patient seen by her PCP Dr. Ginette Pitman, for a follow-up of her left leg lacerated wound had sutures removed a week before she was seen on 11/15/2016. there was swelling and erythema and the edges of the laceration had not completely healed and she had completed a course of doxycycline. She had quit smoking about a year ago. Past medical history significant for anxiety states, asthma, endometriosis, GERD, obesity, sleep apnea, status post appendectomy, cardiac catheterization, cholecystectomy, multiple colonoscopies and EGDs, hysterectomy, knee arthroscopies. she recently restarted smoking after quitting for over 20 years Patient History Information obtained from Patient. Family History Cancer - Father, Diabetes - Mother,Siblings, Heart Disease - Mother,Siblings, Hypertension - Mother,Father,Siblings, Lung Disease - Mother,Father,Siblings, Stroke - Mother,Father, Thyroid Problems - Father,Siblings, No family history of Hereditary Spherocytosis, Kidney Disease, Seizures, Tuberculosis. Social History Current every day smoker - 3 cigarettes a day, Marital Status - Married, Alcohol Use -  Never, Drug Use - No History, Caffeine Use - Daily. Gracy, Aniylah R. ([id number]) Objective Constitutional Pulse regular. Respirations normal and unlabored. Afebrile. Vitals Time Taken: 8:50 AM, Height: 65 in, Weight: 294.4 lbs, BMI: 49, Temperature: 98.2 F, Pulse: 69 bpm, Respiratory Rate: 20 breaths/min, Blood Pressure: 136/64 mmHg. Eyes Nonicteric. Reactive to light. Ears, Nose, Mouth, and Throat Lips, teeth, and gums WNL.Marland Kitchen Moist mucosa without lesions. Neck supple and nontender. No palpable supraclavicular or cervical adenopathy. Normal sized without goiter. Respiratory WNL. No retractions.. Cardiovascular Pedal Pulses WNL. No clubbing, cyanosis or edema. Lymphatic No adneopathy. No adenopathy. No adenopathy. Musculoskeletal Adexa without tenderness or enlargement.. Digits and nails w/o clubbing, cyanosis, infection, petechiae, ischemia, or inflammatory conditions.Marland Kitchen Psychiatric Judgement and insight Intact.. No evidence of depression, anxiety, or agitation.. General Notes: the wound is looking fairly good but needed sharp debridement with a #3 curet and brisk bleeding controlled with pressure Integumentary (Hair, Skin) No suspicious lesions. No crepitus or fluctuance. No peri-wound warmth or erythema. No masses.. Wound #1 status is Open. Original cause of wound was Trauma. The wound is located on the Left,Lateral Lower Leg. The wound measures 4cm length x 3.6cm width x 0.1cm depth; 11.31cm^2 area and 1.131cm^3 volume. There is no tunneling or undermining noted. There is a large amount of serosanguineous drainage noted. The wound margin is distinct with the outline attached to the wound base. There is medium (34-66%) red granulation  within the wound bed. There is a medium (34-66%) amount of necrotic tissue within the wound bed including Adherent Slough. The periwound skin appearance exhibited: Scarring, Maceration. Periwound temperature was noted as No Abnormality. The periwound  has tenderness on palpation. Assessment Active Problems ICD-10 L97.222 - Non-pressure 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 exposed S81.812A - Laceration without foreign body, left lower leg, initial encounter Heinrich, Mirelle R. 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 There was a Skin/Subcutaneous Tissue Debridement ([id number]) debridement with total area of 14.4 sq cm performed by Christin Fudge, MD. with the following instrument(s): Curette to remove Viable and Non-Viable tissue/material including Exudate, Fibrin/Slough, and Subcutaneous after achieving pain control using Lidocaine 4% Topical Solution. A time out was conducted at 09:09, prior to the start of the procedure. A Minimum amount of bleeding was controlled with Pressure. The procedure was tolerated well with a pain level of 0 throughout and a pain level of 0 following the procedure. Post Debridement Measurements: 4cm length x 3.6cm width x 0.2cm depth; 2.262cm^3 volume. Character of Wound/Ulcer Post Debridement requires further debridement. Severity of Tissue Post Debridement is: Fat layer exposed. Post procedure Diagnosis Wound #1: Same as Pre-Procedure Plan Wound Cleansing: Wound #1 Left,Lateral Lower Leg: Clean wound with Normal Saline. Cleanse wound with mild soap and water May Shower, gently pat wound dry prior to applying new dressing. May shower with protection. Anesthetic: Wound #1 Left,Lateral Lower Leg: Topical Lidocaine 4% cream applied to wound bed prior to debridement Skin Barriers/Peri-Wound Care: Wound #1 Left,Lateral Lower Leg: Skin Prep Primary Wound Dressing: Wound #1 Left,Lateral Lower Leg: Silvercel  Non-Adherent Secondary Dressing: Wound #1 Left,Lateral Lower Leg: Dry Gauze Other - telfa island Dressing Change Frequency: Wound #1 Left,Lateral Lower Leg: Change dressing every day. Follow-up Appointments: Wound #1 Left,Lateral Lower Leg: Return Appointment in 1 week. Edema Control: Wound #1 Left,Lateral Lower Leg: Elevate legs to the level of the heart and pump ankles as often as possible Additional Orders / Instructions: Damore, Katielynn R. ([id number]) Wound #1 Left,Lateral Lower Leg: Stop Smoking Increase protein intake. Medications-please add to medication list.: Wound #1 Left,Lateral Lower Leg: Other: - Vitamin C, Vitamin A, Zinc After sharp debridement I have recommended: 1. Silver alginate and a bordered foam to be changed daily 2. Adequate protein, vitamin A, vitamin C and zinc 3. again discussed the need to give completely give up smoking 4. Regular visits wound center She has had all questions answered and says she will be compliant Electronic Signature(s) Signed: 11/30/2016 9:24:01 AM By: Christin Fudge MD, FACS Entered By: Christin Fudge on 11/30/2016 09:24:01 Steury, Timira R. ([id number]) -------------------------------------------------------------------------------- ROS/PFSH Details Patient Name: Vogan, Lizzy R. Date of Service: 11/30/2016 8:45 AM Medical Record Number: [id number] Patient Account Number: [id number] Date of Birth/Sex: Jul 20, 1960 (56 y.o. Female) Treating RN: Carolyne Fiscal, Debi Primary Care Provider: Tracie Harrier Other Clinician: Referring Provider: Tracie Harrier Treating Provider/Extender: Frann Rider in Treatment: 1 Information Obtained From Patient Wound History Do you currently have one or more open woundso Yes How many open wounds do you currently haveo 1 Approximately how long have you had your woundso 4 weeks How have you been treating your wound(s) until nowo saline and neosporin Has your wound(s) ever healed and then  re-openedo No Have you had  any lab work done in the past montho No Have you tested positive for an antibiotic resistant organism (MRSA, VRE)o No Have you tested positive for osteomyelitis (bone infection)o No Have you had any tests for circulation on your legso Yes Who ordered the testo dr. dew [facility] the test [facility] Have you had other problems associated with your woundso Swelling Respiratory Medical History: Positive for: Asthma; Sleep Apnea Cardiovascular Medical History: Positive for: Hypertension Musculoskeletal Medical History: Positive for: Osteoarthritis Immunizations Pneumococcal Vaccine: Received Pneumococcal Vaccination: Yes Implantable Devices Family and Social History Cancer: Yes - Father; Diabetes: Yes - Mother,Siblings; Heart Disease: Yes - Mother,Siblings; Hereditary Spherocytosis: No; Hypertension: Yes - Mother,Father,Siblings; Kidney Disease: No; Lung Disease: Yes - Mother,Father,Siblings; Seizures: No; Stroke: Yes - Mother,Father; Thyroid Problems: Yes - Father,Siblings; Tuberculosis: No; Current every day smoker - 3 cigarettes a day; Marital Status - Married; Alcohol Use: Never; Drug Use: No History; Caffeine Use: Daily; Financial Concerns: No; Food, Clothing or Shelter Needs: No; Support System Lacking: No; Transportation Concerns: No; Advanced Directives: No; Patient does not want information on Advanced Directives; Do not resuscitate: No; Living Will: No; Medical Power of Attorney: No Physician Affirmation I have reviewed and agree with the above information. Aube, Inez R. ([id number]) Electronic Signature(s) Signed: 11/30/2016 11:51:08 AM By: Alric Quan Signed: 11/30/2016 11:55:06 AM By: Christin Fudge MD, FACS Entered By: Christin Fudge on 11/30/2016 09:22:58 Fayette, Kyndel R. ([id number]) -------------------------------------------------------------------------------- SuperBill Details Patient Name: Eaglin, Jewell R. Date of Service:  11/30/2016 Medical Record Number: [id number] Patient Account Number: [id number] Date of Birth/Sex: 1960/01/31 (56 y.o. Female) Treating RN: Carolyne Fiscal, Debi Primary Care Provider: Tracie Harrier Other Clinician: Referring Provider: Tracie Harrier Treating Provider/Extender: Frann Rider in Treatment: 1 Diagnosis Coding ICD-10 Codes Code Description [id number] Non-pressure 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 exposed S81.812A Laceration without foreign body, left lower leg, initial encounter E66.01 Morbid (severe) obesity due to excess calories F17.218 Nicotine dependence, cigarettes, with other nicotine-induced disorders Facility Procedures CPT4 Code: 30160109 Description: 32355 - DEB SUBQ TISSUE 20 SQ CM/< ICD-10 Diagnosis Description L97.222 Non-pressure 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 expos S81.812A Laceration without foreign body, left lower leg, initial enc E66.01 Morbid (severe) obesity due to  excess calories F17.218 Nicotine dependence, cigarettes, with other nicotine-induced Modifier: ed ounter disorders Quantity: 1 Physician Procedures CPT4 Code: 7322025 Description: 42706 - WC PHYS SUBQ TISS 20 SQ CM ICD-10 Diagnosis Description L97.222 Non-pressure 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 expos S81.812A Laceration without foreign body, left lower leg, initial enc E66.01 Morbid (severe) obesity due to  excess calories F17.218 Nicotine dependence, cigarettes, with other nicotine-induced Modifier: ed ounter disorders Quantity: 1 Electronic Signature(s) Signed: 11/30/2016 9:24:11 AM By: Christin Fudge MD, FACS Entered By: Christin Fudge on 11/30/2016 09:24:11

## 2016-12-05 NOTE — Progress Notes (Signed)
COUMBA, KELLISON ([id number]) Visit Report for 11/30/2016 Arrival Information Details Patient Name: Schuenemann, Annjanette R. Date of Service: 11/30/2016 8:45 AM Medical Record Number: [id number] Patient Account Number: [id number] Date of Birth/Sex: 04/18/1960 (56 y.o. Female) Treating RN: Ahmed Prima Primary Care Devondre Guzzetta: Tracie Harrier Other Clinician: Referring Jahziel Sinn: Tracie Harrier Treating Talin Feister/Extender: Frann Rider in Treatment: 1 Visit Information History Since Last Visit All ordered tests and consults were completed: No Patient Arrived: Ambulatory Added or deleted any medications: No Arrival Time: 08:48 Any new allergies or adverse reactions: No Accompanied By: self Had a fall or experienced change in No Transfer Assistance: None activities of daily living that may affect Patient Identification Verified: Yes risk of falls: Secondary Verification Process Completed: Yes Signs or symptoms of abuse/neglect since last visito No Patient Requires Transmission-Based No Hospitalized since last visit: No Precautions: Has Dressing in Place as Prescribed: Yes Patient Has Alerts: No Pain Present Now: No Electronic Signature(s) Signed: 11/30/2016 11:51:08 AM By: Alric Quan Entered By: Alric Quan on 11/30/2016 08:50:15 Rickert, Aleayah R. ([id number]) -------------------------------------------------------------------------------- Encounter Discharge Information Details Patient Name: Elsen, Breeana R. Date of Service: 11/30/2016 8:45 AM Medical Record Number: [id number] Patient Account Number: [id number] Date of Birth/Sex: 11/02/1960 (56 y.o. Female) Treating RN: Carolyne Fiscal, Debi Primary Care Malori Myers: Tracie Harrier Other Clinician: Referring Sharnita Bogucki: Tracie Harrier Treating Chrissie Dacquisto/Extender: Frann Rider in Treatment: 1 Encounter Discharge Information Items Discharge Pain Level: 0 Discharge Condition: Stable Ambulatory Status:  Ambulatory Discharge Destination: Home Transportation: Private Auto Accompanied By: sister Schedule Follow-up Appointment: Yes Medication Reconciliation completed and No provided to Patient/Care Elvin Mccartin: Provided on Clinical Summary of Care: 11/30/2016 Form Type Recipient Paper Patient RD Electronic Signature(s) Signed: 12/05/2016 8:35:27 AM By: Ruthine Dose Entered By: Ruthine Dose on 11/30/2016 09:23:02 Baquero, Sanayah R. ([id number]) -------------------------------------------------------------------------------- Lower Extremity Assessment Details Patient Name: Schaffer, Burma R. Date of Service: 11/30/2016 8:45 AM Medical Record Number: [id number] Patient Account Number: [id number] Date of Birth/Sex: 05-29-1960 (56 y.o. Female) Treating RN: Carolyne Fiscal, Debi Primary Care Leonte Horrigan: Tracie Harrier Other Clinician: Referring Juniper Snyders: Tracie Harrier Treating Vickki Igou/Extender: Frann Rider in Treatment: 1 Vascular Assessment Pulses: Dorsalis Pedis Palpable: [Left:No] Doppler Audible: [Left:Yes] Posterior Tibial Extremity colors, hair growth, and conditions: Extremity Color: [Left:Mottled] Temperature of Extremity: [Left:Cool] Capillary Refill: [Left:< 3 seconds] Toe Nail Assessment Left: Right: Thick: No Discolored: No Deformed: No Improper Length and Hygiene: No Electronic Signature(s) Signed: 11/30/2016 11:51:08 AM By: Alric Quan Entered By: Alric Quan on 11/30/2016 08:57:41 Locy, Jaylah R. ([id number]) -------------------------------------------------------------------------------- Multi Wound Chart Details Patient Name: Podoll, Johnika R. Date of Service: 11/30/2016 8:45 AM Medical Record Number: [id number] Patient Account Number: [id number] Date of Birth/Sex: 11/23/1960 (56 y.o. Female) Treating RN: Carolyne Fiscal, Debi Primary Care Chipper Koudelka: Tracie Harrier Other Clinician: Referring Chemere Steffler: Tracie Harrier Treating Brittania Sudbeck/Extender:  Frann Rider in Treatment: 1 Vital Signs Height(in): 65 Pulse(bpm): 84 Weight(lbs): 294.4 Blood Pressure(mmHg): 136/64 Body Mass Index(BMI): 49 Temperature(F): 98.2 Respiratory Rate 20 (breaths/min): Photos: [1:No Photos] [N/A:N/A] Wound Location: [1:Left Lower Leg - Lateral] [N/A:N/A] Wounding Event: [1:Trauma] [N/A:N/A] Primary Etiology: [1:Arterial Insufficiency Ulcer] [N/A:N/A] Secondary Etiology: [1:Trauma, Other] [N/A:N/A] Comorbid History: [1:Asthma, Sleep Apnea, Hypertension, Osteoarthritis] [N/A:N/A] Date Acquired: [1:10/26/2016] [N/A:N/A] Weeks of Treatment: [1:1] [N/A:N/A] Wound Status: [1:Open] [N/A:N/A] Measurements L x W x D [1:4x3.6x0.1] [N/A:N/A] (cm) Area (cm) : [1:11.31] [N/A:N/A] Volume (cm) : [1:1.131] [N/A:N/A] % Reduction in Area: [1:-53.20%] [N/A:N/A] % Reduction in Volume: [1:-53.30%] [N/A:N/A] Classification: [1:Partial Thickness] [N/A:N/A] Exudate Amount: [1:Large] [N/A:N/A] Exudate Type: [1:Serosanguineous] [N/A:N/A] Exudate Color: [1:red, brown] [N/A:N/A] Wound Margin: [1:Distinct, outline  attached] [N/A:N/A] Granulation Amount: [1:Medium (34-66%)] [N/A:N/A] Granulation Quality: [1:Red] [N/A:N/A] Necrotic Amount: [1:Medium (34-66%)] [N/A:N/A] Epithelialization: [1:None] [N/A:N/A] Debridement: [1:Debridement ([id number])] [N/A:N/A] Pre-procedure [1:09:09] [N/A:N/A] Verification/Time Out Taken: Pain Control: [1:Lidocaine 4% Topical Solution] [N/A:N/A] Tissue Debrided: [1:Fibrin/Slough, Exudates, Subcutaneous] [N/A:N/A] Level: [1:Skin/Subcutaneous Tissue] [N/A:N/A] Debridement Area (sq cm): [1:14.4] [N/A:N/A] Instrument: [1:Curette] [N/A:N/A] Bleeding: [1:Minimum] [N/A:N/A] Hemostasis Achieved: [1:Pressure] [N/A:N/A] Procedural Pain: 0 N/A N/A Post Procedural Pain: 0 N/A N/A Debridement Treatment Procedure was tolerated well N/A N/A Response: Post Debridement 4x3.6x0.2 N/A N/A Measurements L x W x D (cm) Post Debridement  Volume: 2.262 N/A N/A (cm) Periwound Skin Texture: Scarring: Yes N/A N/A Periwound Skin Moisture: Maceration: Yes N/A N/A Periwound Skin Color: No Abnormalities Noted N/A N/A Temperature: No Abnormality N/A N/A Tenderness on Palpation: Yes N/A N/A Wound Preparation: Ulcer Cleansing: N/A N/A Rinsed/Irrigated with Saline Topical Anesthetic Applied: Other: lidocaine 4% Procedures Performed: Debridement N/A N/A Treatment Notes Wound #1 (Left, Lateral Lower Leg) 1. Cleansed with: Clean wound with Normal Saline 2. Anesthetic Topical Lidocaine 4% cream to wound bed prior to debridement 3. Peri-wound Care: Skin Prep 4. Dressing Applied: Other dressing (specify in notes) 5. Secondary [place] Notes silvercel Electronic Signature(s) Signed: 11/30/2016 9:22:27 AM By: Christin Fudge MD, FACS Entered By: Christin Fudge on 11/30/2016 09:22:27 Boening, Najah RMarland Kitchen ([id number]) -------------------------------------------------------------------------------- [place] Details Patient Name: Niess, Tamaira R. Date of Service: 11/30/2016 8:45 AM Medical Record Number: [id number] Patient Account Number: [id number] Date of Birth/Sex: 01/06/1961 (56 y.o. Female) Treating RN: Carolyne Fiscal, Debi Primary Care Daisean Brodhead: Tracie Harrier Other Clinician: Referring Marializ Ferrebee: Tracie Harrier Treating Fabienne Nolasco/Extender: Frann Rider in Treatment: 1 Active Inactive ` Abuse / Safety / Falls / Self Care Management Nursing Diagnoses: History of Falls Potential for falls Goals: Patient will not experience any injury related to falls Date Initiated: 11/23/2016 Target Resolution Date: 03/24/2017 Goal Status: Active Interventions: Assess Activities of Daily Living upon admission and as needed Assess fall risk on admission and as needed Assess: immobility, friction, shearing, incontinence upon admission and as needed Notes: ` Nutrition Nursing  Diagnoses: Imbalanced nutrition Potential for alteratiion in Nutrition/Potential for imbalanced nutrition Goals: Patient/caregiver agrees to and verbalizes understanding of need to use nutritional supplements and/or vitamins as prescribed Date Initiated: 11/23/2016 Target Resolution Date: 03/24/2017 Goal Status: Active Interventions: Assess patient nutrition upon admission and as needed per policy Notes: ` Orientation to the Wound Care Program Nursing Diagnoses: Knowledge deficit related to the wound healing center program Goals: Patient/caregiver will verbalize understanding of the Bruin, Marshall. ([id number]) Date Initiated: 11/23/2016 Target Resolution Date: 12/23/2016 Goal Status: Active Interventions: Provide education on orientation to the wound center Notes: ` Pain, Acute or Chronic Nursing Diagnoses: Pain, acute or chronic: actual or potential Potential alteration in comfort, pain Goals: Patient/caregiver will verbalize adequate pain control between visits Date Initiated: 11/23/2016 Target Resolution Date: 02/24/2017 Goal Status: Active Interventions: Complete pain assessment as per visit requirements Notes: ` Wound/Skin Impairment Nursing Diagnoses: Impaired tissue integrity Knowledge deficit related to smoking impact on wound healing Knowledge deficit related to ulceration/compromised skin integrity Goals: Ulcer/skin breakdown will have a volume reduction of 80% by week 12 Date Initiated: 11/23/2016 Target Resolution Date: 03/24/2017 Goal Status: Active Interventions: Assess patient/caregiver ability to perform ulcer/skin care regimen upon admission and as needed Assess ulceration(s) every visit Provide education on smoking Notes: Electronic Signature(s) Signed: 11/30/2016 11:51:08 AM By: Alric Quan Entered By: Alric Quan on 11/30/2016 08:57:49 Fernando, Vieva R.  ([id number]) -------------------------------------------------------------------------------- Pain Assessment Details Patient  Name: Fore, Rehanna R. Date of Service: 11/30/2016 8:45 AM Medical Record Number: [id number] Patient Account Number: [id number] Date of Birth/Sex: [birth year] (56 y.o. Female) Treating RN: Carolyne Fiscal, Debi Primary Care Dondi Aime: Tracie Harrier Other Clinician: Referring Iyesha Such: Tracie Harrier Treating Joseh Sjogren/Extender: Frann Rider in Treatment: 1 Active Problems Location of Pain Severity and Description of Pain Patient Has Paino No Site Locations Pain Management and Medication Current Pain Management: Electronic Signature(s) Signed: 11/30/2016 11:51:08 AM By: Alric Quan Entered By: Alric Quan on 11/30/2016 08:50:20 Saffo, Skarlett R. ([id number]) -------------------------------------------------------------------------------- Patient/Caregiver Education Details Patient Name: Fidler, Arnetta R. Date of Service: 11/30/2016 8:45 AM Medical Record Number: [id number] Patient Account Number: [id number] Date of Birth/Gender: 04/24/1960 (56 y.o. Female) Treating RN: Carolyne Fiscal, Debi Primary Care Physician: Tracie Harrier Other Clinician: Referring Physician: Tracie Harrier Treating Physician/Extender: Frann Rider in Treatment: 1 Education Assessment Education Provided To: Patient Education Topics Provided Wound/Skin Impairment: Handouts: Other: change dressing as ordered Methods: Demonstration, Explain/Verbal Responses: State content correctly Electronic Signature(s) Signed: 11/30/2016 11:51:08 AM By: Alric Quan Entered By: Alric Quan on 11/30/2016 09:13:03 Zeiser, Morganna R. ([id number]) -------------------------------------------------------------------------------- Wound Assessment Details Patient Name: Bachicha, Carmela R. Date of Service: 11/30/2016 8:45 AM Medical Record Number: [id number] Patient Account  Number: [id number] Date of Birth/Sex: 20-Feb-1960 (56 y.o. Female) Treating RN: Carolyne Fiscal, Debi Primary Care Lajean Boese: Tracie Harrier Other Clinician: Referring Trenden Hazelrigg: Tracie Harrier Treating Offie Pickron/Extender: Frann Rider in Treatment: 1 Wound Status Wound Number: 1 Primary Etiology: Arterial Insufficiency Ulcer Wound Location: Left Lower Leg - Lateral Secondary Trauma, Other Etiology: Wounding Event: Trauma Wound Status: Open Date Acquired: 10/26/2016 Comorbid History: Asthma, Sleep Apnea, Hypertension, Weeks Of Treatment: 1 Osteoarthritis Clustered Wound: No Photos Photo Uploaded By: Alric Quan on 11/30/2016 11:52:08 Wound Measurements Length: (cm) 4 Width: (cm) 3.6 Depth: (cm) 0.1 Area: (cm) 11.31 Volume: (cm) 1.131 % Reduction in Area: -53.2% % Reduction in Volume: -53.3% Epithelialization: None Tunneling: No Undermining: No Wound Description Classification: Partial Thickness Wound Margin: Distinct, outline attached Exudate Amount: Large Exudate Type: Serosanguineous Exudate Color: red, brown Foul Odor After Cleansing: No Slough/Fibrino Yes Wound Bed Granulation Amount: Medium (34-66%) Granulation Quality: Red Necrotic Amount: Medium (34-66%) Necrotic Quality: Adherent Slough Periwound Skin Texture Texture Color No Abnormalities Noted: No No Abnormalities Noted: No Scarring: Yes Temperature / Pain Smithers, Kimerly R. ([id number]) Moisture Temperature: No Abnormality No Abnormalities Noted: No Tenderness on Palpation: Yes Maceration: Yes Wound Preparation Ulcer Cleansing: Rinsed/Irrigated with Saline Topical Anesthetic Applied: Other: lidocaine 4%, Treatment Notes Wound #1 (Left, Lateral Lower Leg) 1. Cleansed with: Clean wound with Normal Saline 2. Anesthetic Topical Lidocaine 4% cream to wound bed prior to debridement 3. Peri-wound Care: Skin Prep 4. Dressing Applied: Other dressing (specify in notes) 5. Secondary [place] Notes silvercel Electronic Signature(s) Signed: 11/30/2016 11:51:08 AM By: Alric Quan Entered By: Alric Quan on 11/30/2016 08:56:20 Mcphee, Walda R. ([id number]) -------------------------------------------------------------------------------- [place] Details Patient Name: Grahn, Sheyna R. Date of Service: 11/30/2016 8:45 AM Medical Record Number: [id number] Patient Account Number: [id number] Date of Birth/Sex: [birth year] (55 y.o. Female) Treating RN: Carolyne Fiscal, Debi Primary Care Angel Weedon: Tracie Harrier Other Clinician: Referring Evelyn Moch: Tracie Harrier Treating Casara Perrier/Extender: Frann Rider in Treatment: 1 Vital Signs Time Taken: 08:50 Temperature (F): 98.2 Height (in): 65 Pulse (bpm): 69 Weight (lbs): 294.4 Respiratory Rate (breaths/min): 20 Body Mass Index (BMI): 49 Blood Pressure (mmHg): 136/64 Reference Range: 80 - 120 mg / dl Electronic Signature(s) Signed: 11/30/2016 11:51:08 AM By: Alric Quan Entered By: Alric Quan on 11/30/2016 08:52:09

## 2016-12-14 ENCOUNTER — Encounter: Payer: Medicare Other | Admitting: Surgery

## 2016-12-14 ENCOUNTER — Other Ambulatory Visit: Payer: Self-pay

## 2016-12-14 ENCOUNTER — Ambulatory Visit: Payer: 59 | Admitting: Psychiatry

## 2016-12-14 ENCOUNTER — Encounter: Payer: Self-pay | Admitting: Psychiatry

## 2016-12-14 VITALS — BP 133/88 | HR 89 | Temp 98.9°F | Wt 297.0 lb

## 2016-12-14 DIAGNOSIS — F331 Major depressive disorder, recurrent, moderate: Secondary | ICD-10-CM

## 2016-12-14 DIAGNOSIS — F411 Generalized anxiety disorder: Secondary | ICD-10-CM | POA: Diagnosis not present

## 2016-12-14 DIAGNOSIS — L97222 Non-pressure 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 exposed: Secondary | ICD-10-CM | POA: Diagnosis not present

## 2016-12-14 DIAGNOSIS — F4310 Post-traumatic stress disorder, unspecified: Secondary | ICD-10-CM

## 2016-12-14 MED ORDER — TRAZODONE HCL 100 MG PO TABS: 100.0000 mg | ORAL_TABLET | Freq: Every day | ORAL | 2 refills | Status: DC

## 2016-12-14 MED ORDER — ESCITALOPRAM OXALATE 10 MG PO TABS: ORAL_TABLET | ORAL | 2 refills | Status: DC

## 2016-12-14 MED ORDER — BUSPIRONE HCL 30 MG PO TABS: 30.0000 mg | ORAL_TABLET | Freq: Two times a day (BID) | ORAL | 2 refills | Status: DC
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
## 2016-12-15 NOTE — Progress Notes (Signed)
AMELA, HANDLEY ([id number]) Visit Report for 12/14/2016 Arrival Information Details Patient Name: [name], [name] Date of Service: 12/14/2016 9:45 AM Medical Record Number: [id number] Patient Account Number: [id number] Date of Birth/Sex: [birth year] (56 y.o. Female) Treating RN: Ahmed Prima Primary Care Darvell Monteforte: Tracie Harrier Other Clinician: Referring Dhrithi Riche: Tracie Harrier Treating Ahava Kissoon/Extender: Frann Rider in Treatment: 3 Visit Information History Since Last Visit All ordered tests and consults were completed: No Patient Arrived: Ambulatory Added or deleted any medications: No Arrival Time: 09:33 Any new allergies or adverse reactions: No Accompanied By: self Had a fall or experienced change in No Transfer Assistance: None activities of daily living that may affect Patient Identification Verified: Yes risk of falls: Secondary Verification Process Completed: Yes Signs or symptoms of abuse/neglect since last visito No Patient Requires Transmission-Based No Hospitalized since last visit: No Precautions: Has Dressing in Place as Prescribed: Yes Patient Has Alerts: No Pain Present Now: No Electronic Signature(s) Signed: 12/14/2016 2:54:02 PM By: Alric Quan Entered By: Alric Quan on 12/14/2016 09:37:16 Spangle, [name] ([id number]) -------------------------------------------------------------------------------- Encounter Discharge Information Details Patient Name: [name], [name] Date of Service: 12/14/2016 9:45 AM Medical Record Number: [id number] Patient Account Number: [id number] Date of Birth/Sex: 27-Nov-1960 (56 y.o. Female) Treating RN: Carolyne Fiscal, Debi Primary Care Mallery Harshman: Tracie Harrier Other Clinician: Referring Mykeisha Dysert: Tracie Harrier Treating Malik Paar/Extender: Frann Rider in Treatment: 3 Encounter Discharge Information Items Discharge Pain Level: 0 Discharge Condition: Stable Ambulatory Status:  Ambulatory Discharge Destination: Home Private Transportation: Auto Accompanied By: self Schedule Follow-up Appointment: Yes Medication Reconciliation completed and provided No to Patient/Care Maribell Demeo: Clinical Summary of Care: Electronic Signature(s) Signed: 12/14/2016 9:47:36 AM By: Alric Quan Entered By: Alric Quan on 12/14/2016 09:47:36 Lenahan, [name] ([id number]) -------------------------------------------------------------------------------- Lower Extremity Assessment Details Patient Name: [name], [name] Date of Service: 12/14/2016 9:45 AM Medical Record Number: [id number] Patient Account Number: [id number] Date of Birth/Sex: 03-14-1960 (56 y.o. Female) Treating RN: Carolyne Fiscal, Debi Primary Care Cobi Delph: Tracie Harrier Other Clinician: Referring Carolos Fecher: Tracie Harrier Treating Kirsten Mckone/Extender: Frann Rider in Treatment: 3 Vascular Assessment Pulses: Dorsalis Pedis Palpable: [Left:Yes] Posterior Tibial Extremity colors, hair growth, and conditions: Extremity Color: [Left:Mottled] Temperature of Extremity: [Left:Warm] Capillary Refill: [Left:< 3 seconds] Toe Nail Assessment Left: Right: Thick: No Discolored: No Deformed: No Improper Length and Hygiene: No Electronic Signature(s) Signed: 12/14/2016 2:54:02 PM By: Alric Quan Entered By: Alric Quan on 12/14/2016 09:44:07 Serviss, [name] ([id number]) -------------------------------------------------------------------------------- Multi Wound Chart Details Patient Name: [name], [name] Date of Service: 12/14/2016 9:45 AM Medical Record Number: [id number] Patient Account Number: [id number] Date of Birth/Sex: 12-Jan-1961 (56 y.o. Female) Treating RN: Carolyne Fiscal, Debi Primary Care Dalina Samara: Tracie Harrier Other Clinician: Referring Elizibeth Breau: Tracie Harrier Treating Nai Dasch/Extender: Frann Rider in Treatment: 3 Vital Signs Height(in): 65 Pulse(bpm):  21 Weight(lbs): 294.4 Blood Pressure(mmHg): 112/84 Body Mass Index(BMI): 49 Temperature(F): 98.3 Respiratory Rate 20 (breaths/min): Photos: [1:No Photos] [N/A:N/A] Wound Location: [1:Left Lower Leg - Lateral] [N/A:N/A] Wounding Event: [1:Trauma] [N/A:N/A] Primary Etiology: [1:Arterial Insufficiency Ulcer] [N/A:N/A] Secondary Etiology: [1:Trauma, Other] [N/A:N/A] Comorbid History: [1:Asthma, Sleep Apnea, Hypertension, Osteoarthritis] [N/A:N/A] Date Acquired: [1:10/26/2016] [N/A:N/A] Weeks of Treatment: [1:3] [N/A:N/A] Wound Status: [1:Open] [N/A:N/A] Measurements L x W x D [1:2.5x1.8x0.1] [N/A:N/A] (cm) Area (cm) : [1:3.534] [N/A:N/A] Volume (cm) : [1:0.353] [N/A:N/A] % Reduction in Area: [1:52.10%] [N/A:N/A] % Reduction in Volume: [1:52.20%] [N/A:N/A] Classification: [1:Partial Thickness] [N/A:N/A] Exudate Amount: [1:Large] [N/A:N/A] Exudate Type: [1:Serosanguineous] [N/A:N/A] Exudate Color: [1:red, brown] [N/A:N/A] Wound Margin: [1:Distinct, outline attached] [N/A:N/A] Granulation Amount: [1:Small (1-33%)] [N/A:N/A] Granulation Quality: [1:Red] [N/A:N/A] Necrotic  Amount: [1:Large (67-100%)] [N/A:N/A] Epithelialization: [1:None] [N/A:N/A] Debridement: [1:Debridement ([id number])] [N/A:N/A] Pre-procedure [1:09:59] [N/A:N/A] Verification/Time Out Taken: Pain Control: [1:Lidocaine 4% Topical Solution] [N/A:N/A] Tissue Debrided: [1:Fibrin/Slough, Exudates, Subcutaneous] [N/A:N/A] Level: [1:Skin/Subcutaneous Tissue] [N/A:N/A] Debridement Area (sq cm): [1:4.5] [N/A:N/A] Instrument: [1:Curette] [N/A:N/A] Bleeding: [1:Minimum] [N/A:N/A] Hemostasis Achieved: [1:Pressure] [N/A:N/A] Procedural Pain: 0 N/A N/A Post Procedural Pain: 0 N/A N/A Debridement Treatment Procedure was tolerated well N/A N/A Response: Post Debridement 2.5x1.8x0.2 N/A N/A Measurements L x W x D (cm) Post Debridement Volume: 0.707 N/A N/A (cm) Periwound Skin Texture: Scarring: Yes N/A  N/A Periwound Skin Moisture: Maceration: Yes N/A N/A Periwound Skin Color: No Abnormalities Noted N/A N/A Temperature: No Abnormality N/A N/A Tenderness on Palpation: Yes N/A N/A Wound Preparation: Ulcer Cleansing: N/A N/A Rinsed/Irrigated with Saline Topical Anesthetic Applied: Other: lidocaine 4% Procedures Performed: Debridement N/A N/A Treatment Notes Electronic Signature(s) Signed: 12/14/2016 10:12:31 AM By: Christin Fudge MD, FACS Entered By: Christin Fudge on 12/14/2016 10:12:31 Wilshire, Bryley R. ([id number]) -------------------------------------------------------------------------------- Grill Details Patient Name: [name], [name] Date of Service: 12/14/2016 9:45 AM Medical Record Number: [id number] Patient Account Number: [id number] Date of Birth/Sex: July 12, 1960 (56 y.o. Female) Treating RN: Carolyne Fiscal, Debi Primary Care Emmah Bratcher: Tracie Harrier Other Clinician: Referring Lennon Richins: Tracie Harrier Treating Nikkole Placzek/Extender: Frann Rider in Treatment: 3 Active Inactive ` Abuse / Safety / Falls / Self Care Management Nursing Diagnoses: History of Falls Potential for falls Goals: Patient will not experience any injury related to falls Date Initiated: 11/23/2016 Target Resolution Date: 03/24/2017 Goal Status: Active Interventions: Assess Activities of Daily Living upon admission and as needed Assess fall risk on admission and as needed Assess: immobility, friction, shearing, incontinence upon admission and as needed Notes: ` Nutrition Nursing Diagnoses: Imbalanced nutrition Potential for alteratiion in Nutrition/Potential for imbalanced nutrition Goals: Patient/caregiver agrees to and verbalizes understanding of need to use nutritional supplements and/or vitamins as prescribed Date Initiated: 11/23/2016 Target Resolution Date: 03/24/2017 Goal Status: Active Interventions: Assess patient nutrition upon admission and as needed per  policy Notes: ` Orientation to the Wound Care Program Nursing Diagnoses: Knowledge deficit related to the wound healing center program Goals: Patient/caregiver will verbalize understanding of the [place]. ([id number]) Date Initiated: 11/23/2016 Target Resolution Date: 12/23/2016 Goal Status: Active Interventions: Provide education on orientation to the wound center Notes: ` Pain, Acute or Chronic Nursing Diagnoses: Pain, acute or chronic: actual or potential Potential alteration in comfort, pain Goals: Patient/caregiver will verbalize adequate pain control between visits Date Initiated: 11/23/2016 Target Resolution Date: 02/24/2017 Goal Status: Active Interventions: Complete pain assessment as per visit requirements Notes: ` Wound/Skin Impairment Nursing Diagnoses: Impaired tissue integrity Knowledge deficit related to smoking impact on wound healing Knowledge deficit related to ulceration/compromised skin integrity Goals: Ulcer/skin breakdown will have a volume reduction of 80% by week 12 Date Initiated: 11/23/2016 Target Resolution Date: 03/24/2017 Goal Status: Active Interventions: Assess patient/caregiver ability to perform ulcer/skin care regimen upon admission and as needed Assess ulceration(s) every visit Provide education on smoking Notes: Electronic Signature(s) Signed: 12/14/2016 2:54:02 PM By: Alric Quan Entered By: Alric Quan on 12/14/2016 09:44:11 [name], [name] ([id number]) -------------------------------------------------------------------------------- Pain Assessment Details Patient Name: [name], [name] Date of Service: 12/14/2016 9:45 AM Medical Record Number: [id number] Patient Account Number: [id number] Date of Birth/Sex: [birth year] (56 y.o. Female) Treating RN: Ahmed Prima Primary Care Deshon Koslowski: Tracie Harrier Other Clinician: Referring Donyea Beverlin: Tracie Harrier Treating Jermani Eberlein/Extender:  Frann Rider in Treatment: 3 Active Problems Location of Pain Severity and Description of Pain Patient  Has Paino No Site Locations Pain Management and Medication Current Pain Management: Electronic Signature(s) Signed: 12/14/2016 2:54:02 PM By: Alric Quan Entered By: Alric Quan on 12/14/2016 09:37:20 [name], [name] ([id number]) -------------------------------------------------------------------------------- Patient/Caregiver Education Details Patient Name: [name], [name] Date of Service: 12/14/2016 9:45 AM Medical Record Number: [id number] Patient Account Number: [id number] Date of Birth/Gender: [birth year] (56 y.o. Female) Treating RN: Ahmed Prima Primary Care Physician: Tracie Harrier Other Clinician: Referring Physician: Tracie Harrier Treating Physician/Extender: Frann Rider in Treatment: 3 Education Assessment Education Provided To: Patient Education Topics Provided Wound/Skin Impairment: Handouts: Other: change dressing as ordered Methods: Demonstration, Explain/Verbal Responses: State content correctly Electronic Signature(s) Signed: 12/14/2016 2:54:02 PM By: Alric Quan Entered By: Alric Quan on 12/14/2016 09:47:55 [name], [name] ([id number]) -------------------------------------------------------------------------------- Wound Assessment Details Patient Name: [name], [name] Date of Service: 12/14/2016 9:45 AM Medical Record Number: [id number] Patient Account Number: [id number] Date of Birth/Sex: [birth year] (56 y.o. Female) Treating RN: Carolyne Fiscal, Debi Primary Care Jasmin Winberry: Tracie Harrier Other Clinician: Referring Demetrice Combes: Tracie Harrier Treating Maize Brittingham/Extender: Frann Rider in Treatment: 3 Wound Status Wound Number: 1 Primary Etiology: Arterial Insufficiency Ulcer Wound Location: Left Lower Leg - Lateral Secondary Trauma, Other Etiology: Wounding Event: Trauma Wound Status: Open Date  Acquired: 10/26/2016 Comorbid History: Asthma, Sleep Apnea, Hypertension, Weeks Of Treatment: 3 Osteoarthritis Clustered Wound: No Photos Photo Uploaded By: Alric Quan on 12/14/2016 11:43:29 Wound Measurements Length: (cm) 2.5 Width: (cm) 1.8 Depth: (cm) 0.1 Area: (cm) 3.534 Volume: (cm) 0.353 % Reduction in Area: 52.1% % Reduction in Volume: 52.2% Epithelialization: None Wound Description Classification: Partial Thickness Wound Margin: Distinct, outline attached Exudate Amount: Large Exudate Type: Serosanguineous Exudate Color: red, brown Foul Odor After Cleansing: No Slough/Fibrino Yes Wound Bed Granulation Amount: Small (1-33%) Granulation Quality: Red Necrotic Amount: Large (67-100%) Necrotic Quality: Adherent Slough Periwound Skin Texture Texture Color No Abnormalities Noted: No No Abnormalities Noted: No Scarring: Yes Temperature / Pain [name], [name] ([id number]) Moisture Temperature: No Abnormality No Abnormalities Noted: No Tenderness on Palpation: Yes Maceration: Yes Wound Preparation Ulcer Cleansing: Rinsed/Irrigated with Saline Topical Anesthetic Applied: Other: lidocaine 4%, Treatment Notes Wound #1 (Left, Lateral Lower Leg) 1. Cleansed with: Clean wound with Normal Saline 2. Anesthetic Topical Lidocaine 4% cream to wound bed prior to debridement 4. Dressing Applied: Prisma Ag 5. Secondary [place] Signature(s) Signed: 12/14/2016 2:54:02 PM By: Alric Quan Entered By: Alric Quan on 12/14/2016 09:42:59 [name], Evalette R. ([id number]) -------------------------------------------------------------------------------- [place] Details Patient Name: Hudgins, Brinleigh R. Date of Service: 12/14/2016 9:45 AM Medical Record Number: [id number] Patient Account Number: [id number] Date of Birth/Sex: 1960-05-16 (56 y.o. Female) Treating RN: Carolyne Fiscal, Debi Primary Care Ramzy Cappelletti: Tracie Harrier Other  Clinician: Referring Breton Berns: Tracie Harrier Treating Arrianna Catala/Extender: Frann Rider in Treatment: 3 Vital Signs Time Taken: 09:37 Temperature (F): 98.3 Height (in): 65 Pulse (bpm): 77 Weight (lbs): 294.4 Respiratory Rate (breaths/min): 20 Body Mass Index (BMI): 49 Blood Pressure (mmHg): 112/84 Reference Range: 80 - 120 mg / dl Electronic Signature(s) Signed: 12/14/2016 2:54:02 PM By: Alric Quan Entered By: Alric Quan on 12/14/2016 09:39:50

## 2016-12-15 NOTE — Progress Notes (Signed)
FREJA, FARO ([id number]) Visit Report for 12/14/2016 Chief Complaint Document Details Patient Name: [name], [name] Date of Service: 12/14/2016 9:45 AM Medical Record Number: [id number] Patient Account Number: [id number] Date of Birth/Sex: 07/25/1960 (56 y.o. Female) Treating RN: Ahmed Prima Primary Care Provider: Tracie Harrier Other Clinician: Referring Provider: Tracie Harrier Treating Provider/Extender: Frann Rider in Treatment: 3 Information Obtained from: Patient Chief Complaint Patient presents to the wound care center with open non-healing surgical wound(s) to the left lower leg which she's had for about a month Electronic Signature(s) Signed: 12/14/2016 10:12:46 AM By: Christin Fudge MD, FACS Entered By: Christin Fudge on 12/14/2016 10:12:45 [name], [name] ([id number]) -------------------------------------------------------------------------------- Debridement Details Patient Name: [name], [name] Date of Service: 12/14/2016 9:45 AM Medical Record Number: [id number] Patient Account Number: [id number] Date of Birth/Sex: 01/14/1961 (56 y.o. Female) Treating RN: Carolyne Fiscal, Debi Primary Care Provider: Tracie Harrier Other Clinician: Referring Provider: Tracie Harrier Treating Provider/Extender: Frann Rider in Treatment: 3 Debridement Performed for Wound #1 Left,Lateral Lower Leg Assessment: Performed By: Physician Christin Fudge, MD Debridement: Debridement Severity of Tissue Pre Fat layer exposed Debridement: Pre-procedure Verification/Time Yes - 09:59 Out Taken: Start Time: 10:00 Pain Control: Lidocaine 4% Topical Solution Level: Skin/Subcutaneous Tissue Total Area Debrided (L x W): 2.5 (cm) x 1.8 (cm) = 4.5 (cm) Tissue and other material Viable, Non-Viable, Exudate, Fibrin/Slough, Subcutaneous debrided: Instrument: Curette Bleeding: Minimum Hemostasis Achieved: Pressure End Time: 10:02 Procedural Pain: 0 Post Procedural  Pain: 0 Response to Treatment: Procedure was tolerated well Post Debridement Measurements of Total Wound Length: (cm) 2.5 Width: (cm) 1.8 Depth: (cm) 0.2 Volume: (cm) 0.707 Character of Wound/Ulcer Post Debridement: Requires Further Debridement Severity of Tissue Post Debridement: Fat layer exposed Post Procedure Diagnosis Same as Pre-procedure Electronic Signature(s) Signed: 12/14/2016 10:12:39 AM By: Christin Fudge MD, FACS Signed: 12/14/2016 2:54:02 PM By: Alric Quan Entered By: Christin Fudge on 12/14/2016 10:12:38 [name], [name] ([id number]) -------------------------------------------------------------------------------- HPI Details Patient Name: [name], [name] Date of Service: 12/14/2016 9:45 AM Medical Record Number: [id number] Patient Account Number: [id number] Date of Birth/Sex: Mar 03, 1960 (56 y.o. Female) Treating RN: Carolyne Fiscal, Debi Primary Care Provider: Tracie Harrier Other Clinician: Referring Provider: Tracie Harrier Treating Provider/Extender: Frann Rider in Treatment: 3 History of Present Illness Location: Patient presents with a wound to left lower leg. Quality: Patient reports experiencing a sharp pain to affected area(s). Severity: Patient states wound are getting worse. Duration: Patient has had the wound for < 4 weeks prior to presenting for treatment Timing: Pain in wound is Intermittent (comes and goes Context: The wound occurred when the patient had a laceration with a sharp object Modifying Factors: Other treatment(s) tried include:suturing done in the ER and sutures removed on 2 weeks Associated Signs and Symptoms: Patient reports having increase discharge. HPI Description: 56 year old patient seen by her PCP Dr. Ginette Pitman, for a follow-up of her left leg lacerated wound had sutures removed a week before she was seen on 11/15/2016. there was swelling and erythema and the edges of the laceration had not completely healed and she had  completed a course of doxycycline. She had quit smoking about a year ago. Past medical history significant for anxiety states, asthma, endometriosis, GERD, obesity, sleep apnea, status post appendectomy, cardiac catheterization, cholecystectomy, multiple colonoscopies and EGDs, hysterectomy, knee arthroscopies. she recently restarted smoking after quitting for over 20 years Electronic Signature(s) Signed: 12/14/2016 10:12:50 AM By: Christin Fudge MD, FACS Entered By: Christin Fudge on 12/14/2016 10:12:50 [name], [name] ([id number]) -------------------------------------------------------------------------------- Physical Exam Details Patient Name: [name],  [name] Date of Service: 12/14/2016 9:45 AM Medical Record Number: [id number] Patient Account Number: [id number] Date of Birth/Sex: [birth year] (56 y.o. Female) Treating RN: Ahmed Prima Primary Care Provider: Tracie Harrier Other Clinician: Referring Provider: Tracie Harrier Treating Provider/Extender: Frann Rider in Treatment: 3 Constitutional . Pulse regular. Respirations normal and unlabored. Afebrile. . Eyes Nonicteric. Reactive to light. Ears, Nose, Mouth, and Throat Lips, teeth, and gums WNL.Marland Kitchen Moist mucosa without lesions. Neck [name] and nontender. No palpable supraclavicular or cervical adenopathy. Normal sized without goiter. Respiratory WNL. No retractions.. Cardiovascular Pedal Pulses WNL. No clubbing, cyanosis or edema. Lymphatic No adneopathy. No adenopathy. No adenopathy. Musculoskeletal Adexa without tenderness or enlargement.. Digits and nails w/o clubbing, cyanosis, infection, petechiae, ischemia, or inflammatory conditions.. Integumentary (Hair, Skin) No suspicious lesions. No crepitus or fluctuance. No peri-wound warmth or erythema. No masses.Marland Kitchen Psychiatric Judgement and insight Intact.. No evidence of depression, anxiety, or agitation.. Notes the inverted V shaped wound needed some sharp  debridement at the edges and once this was removed there is healthy granulation tissue which was compressed to control bleeding Electronic Signature(s) Signed: 12/14/2016 10:13:35 AM By: Christin Fudge MD, FACS Entered By: Christin Fudge on 12/14/2016 10:13:34 [name], [name] ([id number]) -------------------------------------------------------------------------------- Physician Orders Details Patient Name: [name], [name] Date of Service: 12/14/2016 9:45 AM Medical Record Number: [id number] Patient Account Number: [id number] Date of Birth/Sex: May 21, 1960 (56 y.o. Female) Treating RN: Carolyne Fiscal, Debi Primary Care Provider: Tracie Harrier Other Clinician: Referring Provider: Tracie Harrier Treating Provider/Extender: Frann Rider in Treatment: 3 Verbal / Phone Orders: Yes Clinician: Pinkerton, Debi Read Back and Verified: Yes Diagnosis Coding Wound Cleansing Wound #1 Left,Lateral Lower Leg o Clean wound with Normal Saline. o Cleanse wound with mild soap and water o May Shower, gently pat wound dry prior to applying new dressing. o May shower with protection. Anesthetic Wound #1 Left,Lateral Lower Leg o Topical Lidocaine 4% cream applied to wound bed prior to debridement Skin Barriers/Peri-Wound Care Wound #1 Left,Lateral Lower Leg o Skin Prep Primary Wound Dressing Wound #1 Left,Lateral Lower Leg o Prisma Ag Secondary Dressing Wound #1 Left,Lateral Lower Leg o Dry Gauze o Other - telfa island Dressing Change Frequency Wound #1 Left,Lateral Lower Leg o Change dressing every day. Follow-up Appointments Wound #1 Left,Lateral Lower Leg o Return Appointment in 1 week. Edema Control Wound #1 Left,Lateral Lower Leg o Elevate legs to the level of the heart and pump ankles as often as possible Additional Orders / Instructions Wound #1 Left,Lateral Lower Leg o Stop Smoking o Increase protein intake. [name], [name]  ([id number]) Medications-please add to medication list. Wound #1 Left,Lateral Lower Leg o Other: - Vitamin C, Vitamin A, Zinc Electronic Signature(s) Signed: 12/14/2016 2:54:02 PM By: Alric Quan Signed: 12/14/2016 4:36:05 PM By: Christin Fudge MD, FACS Entered By: Alric Quan on 12/14/2016 10:02:00 Gloss, Mirela R. ([id number]) -------------------------------------------------------------------------------- Problem List Details Patient Name: [name], [name] Date of Service: 12/14/2016 9:45 AM Medical Record Number: [id number] Patient Account Number: [id number] Date of Birth/Sex: 1960/06/04 (56 y.o. Female) Treating RN: Carolyne Fiscal, Debi Primary Care Provider: Tracie Harrier Other Clinician: Referring Provider: Tracie Harrier Treating Provider/Extender: Frann Rider in Treatment: 3 Active Problems ICD-10 Encounter Code Description Active Date Diagnosis 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d 11/23/2016 Yes S81.812A Laceration without foreign body, left lower leg, initial encounter 11/23/2016 Yes E66.01 Morbid (severe) obesity due to excess calories 11/23/2016 Yes F17.218 Nicotine dependence, cigarettes, with other nicotine-induced 11/23/2016 Yes disorders Inactive Problems Resolved Problems Electronic Signature(s) Signed:  12/14/2016 10:12:27 AM By: Christin Fudge MD, FACS Entered By: Christin Fudge on 12/14/2016 10:12:26 Szabo, Liyla R. ([id number]) -------------------------------------------------------------------------------- Progress Note Details Patient Name: [name], [name] Date of Service: 12/14/2016 9:45 AM Medical Record Number: [id number] Patient Account Number: [id number] Date of Birth/Sex: July 14, 1960 (56 y.o. Female) Treating RN: Ahmed Prima Primary Care Provider: Tracie Harrier Other Clinician: Referring Provider: Tracie Harrier Treating Provider/Extender: Frann Rider in Treatment:  3 Subjective Chief Complaint Information obtained from Patient Patient presents to the wound care center with open non-healing surgical wound(s) to the left lower leg which she's had for about a month History of Present Illness (HPI) The following HPI elements were documented for the patient's wound: Location: Patient presents with a wound to left lower leg. Quality: Patient reports experiencing a sharp pain to affected area(s). Severity: Patient states wound are getting worse. Duration: Patient has had the wound for < 4 weeks prior to presenting for treatment Timing: Pain in wound is Intermittent (comes and goes Context: The wound occurred when the patient had a laceration with a sharp object Modifying Factors: Other treatment(s) tried include:suturing done in the ER and sutures removed on 2 weeks Associated Signs and Symptoms: Patient reports having increase discharge. 56 year old patient seen by her PCP Dr. Ginette Pitman, for a follow-up of her left leg lacerated wound had sutures removed a week before she was seen on 11/15/2016. there was swelling and erythema and the edges of the laceration had not completely healed and she had completed a course of doxycycline. She had quit smoking about a year ago. Past medical history significant for anxiety states, asthma, endometriosis, GERD, obesity, sleep apnea, status post appendectomy, cardiac catheterization, cholecystectomy, multiple colonoscopies and EGDs, hysterectomy, knee arthroscopies. she recently restarted smoking after quitting for over 20 years Patient History Information obtained from Patient. Family History Cancer - Father, Diabetes - Mother,Siblings, Heart Disease - Mother,Siblings, Hypertension - Mother,Father,Siblings, Lung Disease - Mother,Father,Siblings, Stroke - Mother,Father, Thyroid Problems - Father,Siblings, No family history of Hereditary Spherocytosis, Kidney Disease, Seizures, Tuberculosis. Social History Current every  day smoker - 3 cigarettes a day, Marital Status - Married, Alcohol Use - Never, Drug Use - No History, Caffeine Use - Daily. [name], [name] ([id number]) Objective Constitutional Pulse regular. Respirations normal and unlabored. Afebrile. Vitals Time Taken: 9:37 AM, Height: 65 in, Weight: 294.4 lbs, BMI: 49, Temperature: 98.3 F, Pulse: 77 bpm, Respiratory Rate: 20 breaths/min, Blood Pressure: 112/84 mmHg. Eyes Nonicteric. Reactive to light. Ears, Nose, Mouth, and Throat Lips, teeth, and gums WNL.Marland Kitchen Moist mucosa without lesions. Neck [name] and nontender. No palpable supraclavicular or cervical adenopathy. Normal sized without goiter. Respiratory WNL. No retractions.. Cardiovascular Pedal Pulses WNL. No clubbing, cyanosis or edema. Lymphatic No adneopathy. No adenopathy. No adenopathy. Musculoskeletal Adexa without tenderness or enlargement.. Digits and nails w/o clubbing, cyanosis, infection, petechiae, ischemia, or inflammatory conditions.Marland Kitchen Psychiatric Judgement and insight Intact.. No evidence of depression, anxiety, or agitation.. General Notes: the inverted V shaped wound needed some sharp debridement at the edges and once this was removed there is healthy granulation tissue which was compressed to control bleeding Integumentary (Hair, Skin) No suspicious lesions. No crepitus or fluctuance. No peri-wound warmth or erythema. No masses.. Wound #1 status is Open. Original cause of wound was Trauma. The wound is located on the Left,Lateral Lower Leg. The wound measures 2.5cm length x 1.8cm width x 0.1cm depth; 3.534cm^2 area and 0.353cm^3 volume. There is a large amount of serosanguineous drainage noted. The wound margin is distinct with the outline attached to  the wound base. There is small (1-33%) red granulation within the wound bed. There is a large (67-100%) amount of necrotic tissue within the wound bed including Adherent Slough. The periwound skin appearance exhibited:  Scarring, Maceration. Periwound temperature was noted as No Abnormality. The periwound has tenderness on palpation. Assessment Active Problems ICD-10 L97.222 - Non-pressure 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 exposed S81.812A - Laceration without foreign body, left lower leg, initial encounter [name], [name] 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 There was a Skin/Subcutaneous Tissue Debridement ([id number]) debridement with total area of 4.5 sq cm performed by Christin Fudge, MD. with the following instrument(s): Curette to remove Viable and Non-Viable tissue/material including Exudate, Fibrin/Slough, and Subcutaneous after achieving pain control using Lidocaine 4% Topical Solution. A time out was conducted at 09:59, prior to the start of the procedure. A Minimum amount of bleeding was controlled with Pressure. The procedure was tolerated well with a pain level of 0 throughout and a pain level of 0 following the procedure. Post Debridement Measurements: 2.5cm length x 1.8cm width x 0.2cm depth; 0.707cm^3 volume. Character of Wound/Ulcer Post Debridement requires further debridement. Severity of Tissue Post Debridement is: Fat layer exposed. Post procedure Diagnosis Wound #1: Same as Pre-Procedure Plan Wound Cleansing: Wound #1 Left,Lateral Lower Leg: Clean wound with Normal Saline. Cleanse wound with mild soap and water May Shower, gently pat wound dry prior to applying new dressing. May shower with protection. Anesthetic: Wound #1 Left,Lateral Lower Leg: Topical Lidocaine 4% cream applied to wound bed prior to debridement Skin Barriers/Peri-Wound Care: Wound #1 Left,Lateral Lower  Leg: Skin Prep Primary Wound Dressing: Wound #1 Left,Lateral Lower Leg: Prisma Ag Secondary Dressing: Wound #1 Left,Lateral Lower Leg: Dry Gauze Other - telfa island Dressing Change Frequency: Wound #1 Left,Lateral Lower Leg: Change dressing every day. Follow-up Appointments: Wound #1 Left,Lateral Lower Leg: Return Appointment in 1 week. Edema Control: Wound #1 Left,Lateral Lower Leg: Elevate legs to the level of the heart and pump ankles as often as possible Additional Orders / Instructions: [name], Talin R. ([id number]) Wound #1 Left,Lateral Lower Leg: Stop Smoking Increase protein intake. Medications-please add to medication list.: Wound #1 Left,Lateral Lower Leg: Other: - Vitamin C, Vitamin A, Zinc After sharp debridement I have recommended: 1. Silver collagen and a bordered foam to be changed daily 2. Adequate protein, vitamin A, vitamin C and zinc 3. again discussed the need to give completely give up smoking 4. Regular visits wound center She has had all questions answered and says she will be compliant Electronic Signature(s) Signed: 12/14/2016 10:14:30 AM By: Christin Fudge MD, FACS Entered By: Christin Fudge on 12/14/2016 10:14:29 Lemon, Yannely R. ([id number]) -------------------------------------------------------------------------------- ROS/PFSH Details Patient Name: Poullard, Noriko R. Date of Service: 12/14/2016 9:45 AM Medical Record Number: [id number] Patient Account Number: [id number] Date of Birth/Sex: 1960-12-21 (56 y.o. Female) Treating RN: Ahmed Prima Primary Care Provider: Tracie Harrier Other Clinician: Referring Provider: Tracie Harrier Treating Provider/Extender: Frann Rider in Treatment: 3 Information Obtained From Patient Wound History Do you currently have one or more open woundso Yes How many open wounds do you currently haveo 1 Approximately how long have you had your woundso 4 weeks How have you been treating your wound(s)  until nowo saline and neosporin Has your wound(s)  ever healed and then re-openedo No Have you had any lab work done in the past montho No Have you tested positive for an antibiotic resistant organism (MRSA, VRE)o No Have you tested positive for osteomyelitis (bone infection)o No Have you had any tests for circulation on your legso Yes Who ordered the testo dr. dew [facility] the test [facility] Have you had other problems associated with your woundso Swelling Respiratory Medical History: Positive for: Asthma; Sleep Apnea Cardiovascular Medical History: Positive for: Hypertension Musculoskeletal Medical History: Positive for: Osteoarthritis Immunizations Pneumococcal Vaccine: Received Pneumococcal Vaccination: Yes Implantable Devices Family and Social History Cancer: Yes - Father; Diabetes: Yes - Mother,Siblings; Heart Disease: Yes - Mother,Siblings; Hereditary Spherocytosis: No; Hypertension: Yes - Mother,Father,Siblings; Kidney Disease: No; Lung Disease: Yes - Mother,Father,Siblings; Seizures: No; Stroke: Yes - Mother,Father; Thyroid Problems: Yes - Father,Siblings; Tuberculosis: No; Current every day smoker - 3 cigarettes a day; Marital Status - Married; Alcohol Use: Never; Drug Use: No History; Caffeine Use: Daily; Financial Concerns: No; Food, Clothing or Shelter Needs: No; Support System Lacking: No; Transportation Concerns: No; Advanced Directives: No; Patient does not want information on Advanced Directives; Do not resuscitate: No; Living Will: No; Medical Power of Attorney: No Physician Affirmation I have reviewed and agree with the above information. Chaylee, Ehrsam Jordis R. ([id number]) Electronic Signature(s) Signed: 12/14/2016 2:54:02 PM By: Alric Quan Signed: 12/14/2016 4:36:05 PM By: Christin Fudge MD, FACS Entered By: Christin Fudge on 12/14/2016 10:12:58 Grell, Shacora R.  ([id number]) -------------------------------------------------------------------------------- SuperBill Details Patient Name: Sexson, Maleia R. Date of Service: 12/14/2016 Medical Record Number: [id number] Patient Account Number: [id number] Date of Birth/Sex: 1960/02/17 (56 y.o. Female) Treating RN: Carolyne Fiscal, Debi Primary Care Provider: Tracie Harrier Other Clinician: Referring Provider: Tracie Harrier Treating Provider/Extender: Frann Rider in Treatment: 3 Diagnosis Coding ICD-10 Codes Code Description [phone] Non-pressure 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 exposed S81.812A Laceration without foreign body, left lower leg, initial encounter E66.01 Morbid (severe) obesity due to excess calories F17.218 Nicotine dependence, cigarettes, with other nicotine-induced disorders Facility Procedures CPT4 Code: [id number] Description: 96222 - DEB SUBQ TISSUE 20 SQ CM/< ICD-10 Diagnosis Description L97.222 Non-pressure 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 expos S81.812A Laceration without foreign body, left lower leg, initial enc E66.01 Morbid (severe) obesity due to  excess calories F17.218 Nicotine dependence, cigarettes, with other nicotine-induced Modifier: ed ounter disorders Quantity: 1 Physician Procedures CPT4 Code: [id number] Description: 19417 - WC PHYS SUBQ TISS 20 SQ CM ICD-10 Diagnosis Description L97.222 Non-pressure 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 expos S81.812A Laceration without foreign body, left lower leg, initial enc E66.01 Morbid (severe) obesity due to  excess calories F17.218 Nicotine dependence, cigarettes, with other nicotine-induced Modifier: ed ounter disorders Quantity: 1 Electronic Signature(s) Signed: 12/14/2016 10:14:42 AM By: Christin Fudge MD, FACS Entered By: Christin Fudge on 12/14/2016 10:14:42

## 2016-12-20 IMAGING — CR DG CHEST 1V PORT
1 series · 1 of 1 positions shown · non-contrast
Comparison: Chest radiograph March 20, 2013

CLINICAL DATA: Lower quadrant pain beginning last night, LEFT chest
pain. History of COPD.

EXAM:
PORTABLE CHEST - 1 VIEW

[ap]
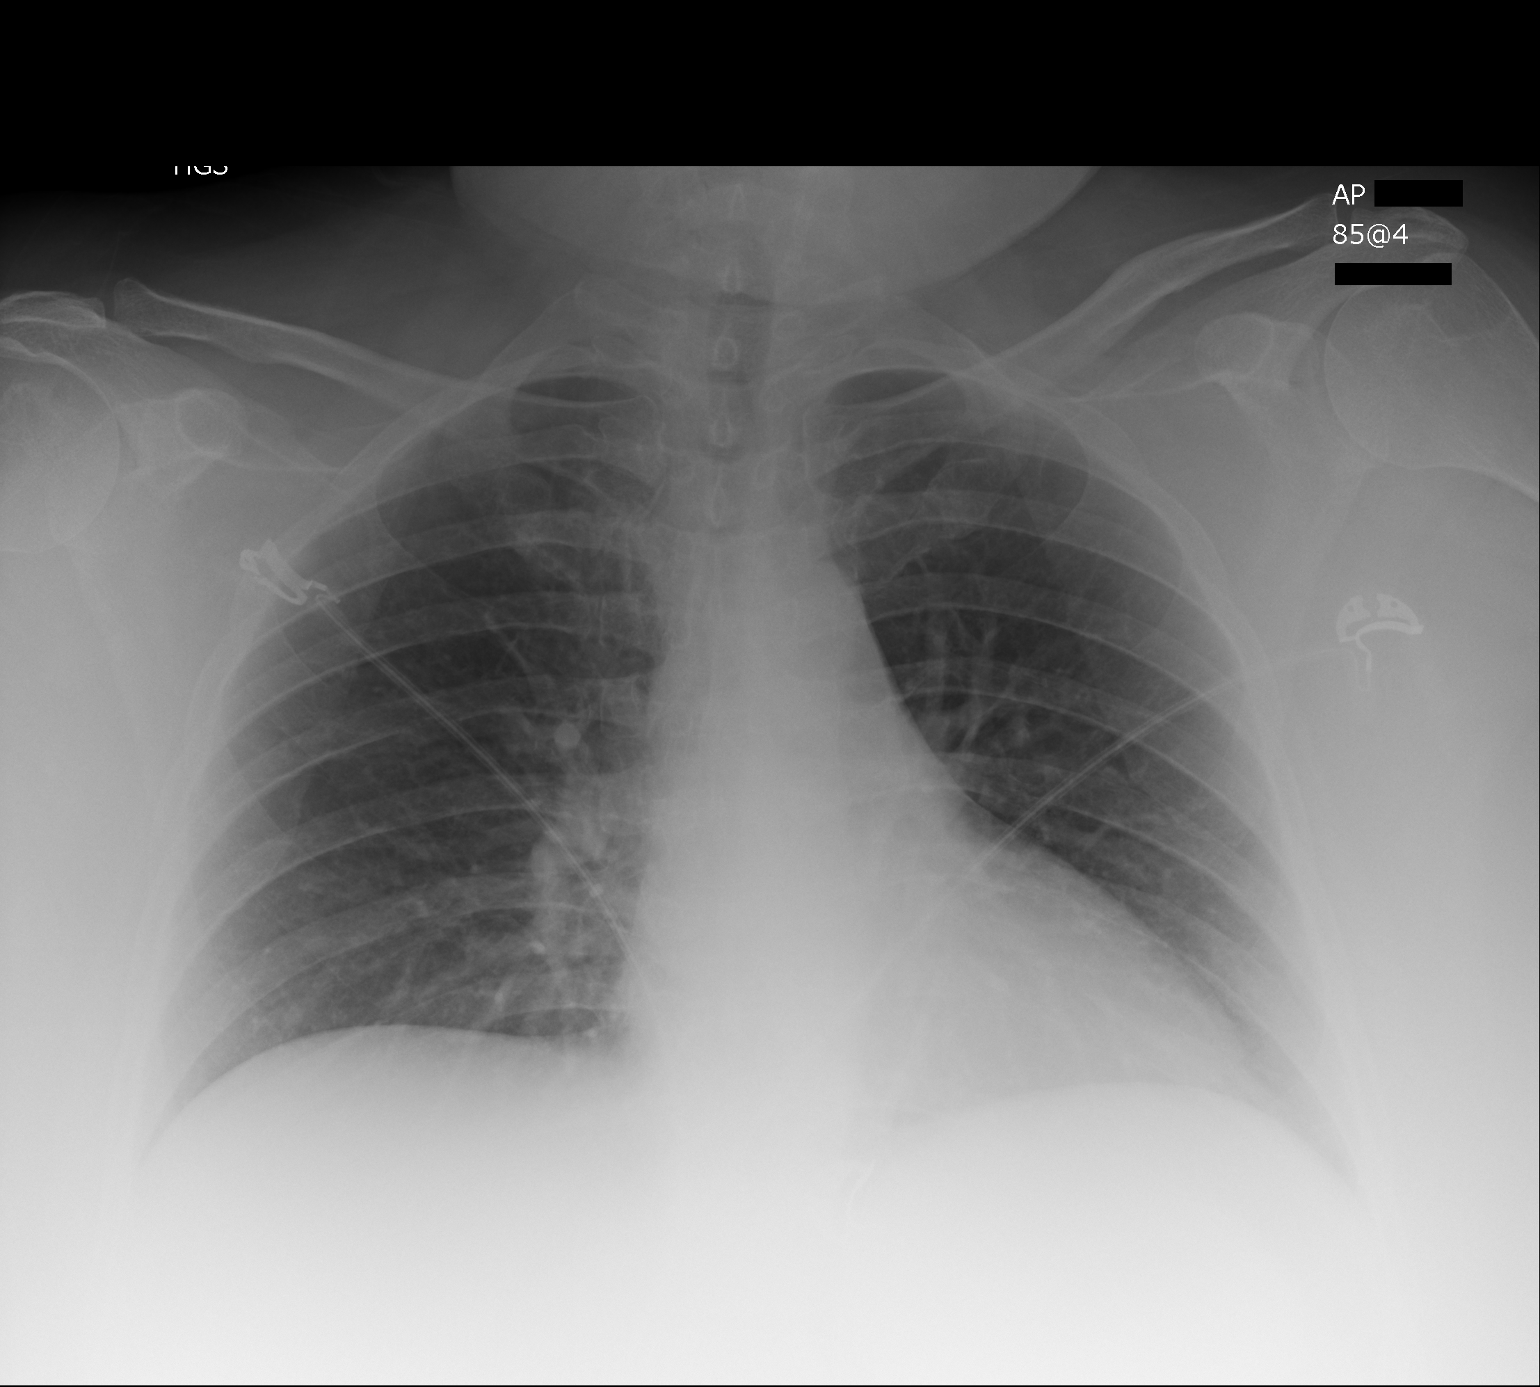

[1 of 1 positions shown; findings below may reference images not displayed]

FINDINGS: Cardiomediastinal silhouette is unremarkable for this low
inspiratory portable examination with crowded vasculature markings.
The lungs are clear without pleural effusions or focal
consolidations. Trachea projects midline and there is no
pneumothorax. Included soft tissue planes and osseous structures are
non-suspicious. Large body habitus.
IMPRESSION: No acute cardiopulmonary process for this low inspiratory
examination.

  By: Raghu Madsen

## 2016-12-21 ENCOUNTER — Encounter: Payer: Medicare Other | Attending: Surgery | Admitting: Surgery

## 2016-12-21 DIAGNOSIS — G4730 Sleep apnea, unspecified: Secondary | ICD-10-CM | POA: Insufficient documentation

## 2016-12-21 DIAGNOSIS — M1990 Unspecified osteoarthritis, unspecified site: Secondary | ICD-10-CM | POA: Diagnosis not present

## 2016-12-21 DIAGNOSIS — F17218 Nicotine dependence, cigarettes, with other nicotine-induced disorders: Secondary | ICD-10-CM | POA: Insufficient documentation

## 2016-12-21 DIAGNOSIS — Z6841 Body Mass Index (BMI) 40.0 and over, adult: Secondary | ICD-10-CM | POA: Insufficient documentation

## 2016-12-21 DIAGNOSIS — W540XXA Bitten by dog, initial encounter: Secondary | ICD-10-CM | POA: Diagnosis not present

## 2016-12-21 DIAGNOSIS — F419 Anxiety disorder, unspecified: Secondary | ICD-10-CM | POA: Insufficient documentation

## 2016-12-21 DIAGNOSIS — S81812A Laceration without foreign body, left lower leg, initial encounter: Secondary | ICD-10-CM | POA: Diagnosis not present

## 2016-12-21 DIAGNOSIS — L97222 Non-pressure 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 exposed: Secondary | ICD-10-CM | POA: Diagnosis present

## 2016-12-21 DIAGNOSIS — K219 Gastro-esophageal reflux disease without esophagitis: Secondary | ICD-10-CM | POA: Diagnosis not present

## 2016-12-21 DIAGNOSIS — E669 Obesity, unspecified: Secondary | ICD-10-CM | POA: Insufficient documentation

## 2016-12-21 DIAGNOSIS — S81852A Open bite, left lower leg, initial encounter: Secondary | ICD-10-CM | POA: Diagnosis not present

## 2016-12-21 DIAGNOSIS — J45909 Unspecified asthma, uncomplicated: Secondary | ICD-10-CM | POA: Insufficient documentation

## 2016-12-21 DIAGNOSIS — I10 Essential (primary) hypertension: Secondary | ICD-10-CM | POA: Insufficient documentation

## 2016-12-23 NOTE — Progress Notes (Signed)
ALYSSHA, HOUSH ([id number]) Visit Report for 12/21/2016 Chief Complaint Document Details Patient Name: [name], [name] Date of Service: 12/21/2016 8:00 AM Medical Record Number: [id number] Patient Account Number: [id number] Date of Birth/Sex: 11-06-1960 (56 y.o. Female) Treating RN: Ahmed Prima Primary Care Provider: Tracie Harrier Other Clinician: Referring Provider: Tracie Harrier Treating Provider/Extender: Frann Rider in Treatment: 4 Information Obtained from: Patient Chief Complaint Patient presents to the wound care center with open non-healing surgical wound(s) to the left lower leg which she's had for about a month Electronic Signature(s) Signed: 12/21/2016 9:08:55 AM By: Christin Fudge MD, FACS Entered By: Christin Fudge on 12/21/2016 09:08:55 [name], [name] ([id number]) -------------------------------------------------------------------------------- Debridement Details Patient Name: [name], [name] Date of Service: 12/21/2016 8:00 AM Medical Record Number: [id number] Patient Account Number: [id number] Date of Birth/Sex: [birth year] (56 y.o. Female) Treating RN: Carolyne Fiscal, Debi Primary Care Provider: Tracie Harrier Other Clinician: Referring Provider: Tracie Harrier Treating Provider/Extender: Frann Rider in Treatment: 4 Debridement Performed for Wound #1 Left,Lateral Lower Leg Assessment: Performed By: Physician Christin Fudge, MD Debridement: Debridement Severity of Tissue Pre Fat layer exposed Debridement: Pre-procedure Verification/Time Yes - 08:23 Out Taken: Start Time: 08:24 Pain Control: Lidocaine 4% Topical Solution Level: Skin/Subcutaneous Tissue Total Area Debrided (L x W): 2.4 (cm) x 1.5 (cm) = 3.6 (cm) Tissue and other material Viable, Non-Viable, Exudate, Fibrin/Slough, Subcutaneous debrided: Instrument: Curette Bleeding: Minimum Hemostasis Achieved: Pressure End Time: 08:25 Procedural Pain: 0 Post Procedural Pain:  0 Response to Treatment: Procedure was tolerated well Post Debridement Measurements of Total Wound Length: (cm) 2.4 Width: (cm) 1.5 Depth: (cm) 0.2 Volume: (cm) 0.565 Character of Wound/Ulcer Post Debridement: Requires Further Debridement Severity of Tissue Post Debridement: Fat layer exposed Post Procedure Diagnosis Same as Pre-procedure Electronic Signature(s) Signed: 12/21/2016 9:08:37 AM By: Christin Fudge MD, FACS Signed: 12/21/2016 4:42:16 PM By: Alric Quan Entered By: Christin Fudge on 12/21/2016 09:08:37 [name], [name] ([id number]) -------------------------------------------------------------------------------- Debridement Details Patient Name: [name], [name] Date of Service: 12/21/2016 8:00 AM Medical Record Number: [id number] Patient Account Number: [id number] Date of Birth/Sex: 12-18-1960 (56 y.o. Female) Treating RN: Carolyne Fiscal, Debi Primary Care Provider: Tracie Harrier Other Clinician: Referring Provider: Tracie Harrier Treating Provider/Extender: Frann Rider in Treatment: 4 Debridement Performed for Wound #2 Left,Medial Lower Leg Assessment: Performed By: Physician Christin Fudge, MD Debridement: Debridement Pre-procedure Verification/Time Yes - 08:23 Out Taken: Start Time: 08:26 Pain Control: Lidocaine 4% Topical Solution Level: Skin/Subcutaneous Tissue Total Area Debrided (L x W): 0.7 (cm) x 0.7 (cm) = 0.49 (cm) Tissue and other material Viable, Non-Viable, Exudate, Fibrin/Slough, Subcutaneous debrided: Instrument: Curette Bleeding: Minimum Hemostasis Achieved: Pressure End Time: 08:28 Procedural Pain: 0 Post Procedural Pain: 0 Response to Treatment: Procedure was tolerated well Post Debridement Measurements of Total Wound Length: (cm) 0.7 Width: (cm) 0.7 Depth: (cm) 0.2 Volume: (cm) 0.077 Character of Wound/Ulcer Post Debridement: Requires Further Debridement Post Procedure Diagnosis Same as Pre-procedure Electronic  Signature(s) Signed: 12/21/2016 9:08:46 AM By: Christin Fudge MD, FACS Signed: 12/21/2016 4:42:16 PM By: Alric Quan Entered By: Christin Fudge on 12/21/2016 09:08:46 [name], [name] ([id number]) -------------------------------------------------------------------------------- HPI Details Patient Name: [name], [name] Date of Service: 12/21/2016 8:00 AM Medical Record Number: [id number] Patient Account Number: [id number] Date of Birth/Sex: 1960-08-19 (56 y.o. Female) Treating RN: Carolyne Fiscal, Debi Primary Care Provider: Tracie Harrier Other Clinician: Referring Provider: Tracie Harrier Treating Provider/Extender: Frann Rider in Treatment: 4 History of Present Illness Location: Patient presents with a wound to left lower leg. Quality: Patient reports experiencing a sharp pain to affected  area(s). Severity: Patient states wound are getting worse. Duration: Patient has had the wound for < 4 weeks prior to presenting for treatment Timing: Pain in wound is Intermittent (comes and goes Context: The wound occurred when the patient had a laceration with a sharp object Modifying Factors: Other treatment(s) tried include:suturing done in the ER and sutures removed on 2 weeks Associated Signs and Symptoms: Patient reports having increase discharge. HPI Description: 56 year old patient seen by her PCP Dr. Ginette Pitman, for a follow-up of her left leg lacerated wound had sutures removed a week before she was seen on 11/15/2016. there was swelling and erythema and the edges of the laceration had not completely healed and she had completed a course of doxycycline. She had quit smoking about a year ago. Past medical history significant for anxiety states, asthma, endometriosis, GERD, obesity, sleep apnea, status post appendectomy, cardiac catheterization, cholecystectomy, multiple colonoscopies and EGDs, hysterectomy, knee arthroscopies. she recently restarted smoking after quitting for over 20  years 12/21/2016 -- she now presents with a new wound to the left lower third of her leg where her dog bit her and caused a puncture wound which is now been draining and has some necrotic debris. She was seen by her PCP and he has put her on doxycycline orally. She has given up smoking this last week. Electronic Signature(s) Signed: 12/21/2016 9:09:36 AM By: Christin Fudge MD, FACS Entered By: Christin Fudge on 12/21/2016 09:09:36 Westergren, Princessa R. ([id number]) -------------------------------------------------------------------------------- Physical Exam Details Patient Name: [name], [name] Date of Service: 12/21/2016 8:00 AM Medical Record Number: [id number] Patient Account Number: [id number] Date of Birth/Sex: 1960-02-02 (56 y.o. Female) Treating RN: Ahmed Prima Primary Care Provider: Tracie Harrier Other Clinician: Referring Provider: Tracie Harrier Treating Provider/Extender: Frann Rider in Treatment: 4 Constitutional . Pulse regular. Respirations normal and unlabored. Afebrile. . Eyes Nonicteric. Reactive to light. Ears, Nose, Mouth, and Throat Lips, teeth, and gums WNL.Marland Kitchen Moist mucosa without lesions. Neck supple and nontender. No palpable supraclavicular or cervical adenopathy. Normal sized without goiter. Respiratory WNL. No retractions.. Cardiovascular Pedal Pulses WNL. No clubbing, cyanosis or edema. Lymphatic No adneopathy. No adenopathy. No adenopathy. Musculoskeletal Adexa without tenderness or enlargement.. Digits and nails w/o clubbing, cyanosis, infection, petechiae, ischemia, or inflammatory conditions.. Integumentary (Hair, Skin) No suspicious lesions. No crepitus or fluctuance. No peri-wound warmth or erythema. No masses.Marland Kitchen Psychiatric Judgement and insight Intact.. No evidence of depression, anxiety, or agitation.. Notes the original wound on the left upper calf continues to have minimal debris which was sharply debrided with a #3 curet. The new  puncture wound on the lower third of her left leg is fairly deep with necrotic debris and using a #3 curet to sharply remove this and this is deep down into the subcutaneous tissue. There is no evidence of gross cellulitis or abscess formation. Electronic Signature(s) Signed: 12/21/2016 9:10:29 AM By: Christin Fudge MD, FACS Entered By: Christin Fudge on 12/21/2016 09:10:29 [name], [name] ([id number]) -------------------------------------------------------------------------------- Physician Orders Details Patient Name: [name], [name] Date of Service: 12/21/2016 8:00 AM Medical Record Number: [id number] Patient Account Number: [id number] Date of Birth/Sex: 01/30/1960 (56 y.o. Female) Treating RN: Carolyne Fiscal, Debi Primary Care Provider: Tracie Harrier Other Clinician: Referring Provider: Tracie Harrier Treating Provider/Extender: Frann Rider in Treatment: 4 Verbal / Phone Orders: Yes Clinician: Pinkerton, Debi Read Back and Verified: Yes Diagnosis Coding Wound Cleansing Wound #1 Left,Lateral Lower Leg o Clean wound with Normal Saline. o Cleanse wound with mild soap and water o May Shower, gently pat wound dry  prior to applying new dressing. o May shower with protection. Wound #2 Left,Medial Lower Leg o Clean wound with Normal Saline. o Cleanse wound with mild soap and water o May Shower, gently pat wound dry prior to applying new dressing. o May shower with protection. Anesthetic Wound #1 Left,Lateral Lower Leg o Topical Lidocaine 4% cream applied to wound bed prior to debridement Wound #2 Left,Medial Lower Leg o Topical Lidocaine 4% cream applied to wound bed prior to debridement Skin Barriers/Peri-Wound Care Wound #1 Left,Lateral Lower Leg o Skin Prep Wound #2 Left,Medial Lower Leg o Skin Prep Primary Wound Dressing Wound #1 Left,Lateral Lower Leg o Prisma Ag Wound #2 Left,Medial Lower Leg o Santyl Ointment Secondary Dressing Wound  #1 Left,Lateral Lower Leg o Dry Gauze o Other - telfa island Wound #2 Left,Medial Lower Leg o Dry Gauze o Other - telfa island [name], [name] ([id number]) Dressing Change Frequency Wound #1 Left,Lateral Lower Leg o Change dressing every other day. Wound #2 Left,Medial Lower Leg o Change dressing every day. Follow-up Appointments Wound #1 Left,Lateral Lower Leg o Return Appointment in 1 week. Wound #2 Left,Medial Lower Leg o Return Appointment in 1 week. Edema Control Wound #1 Left,Lateral Lower Leg o Elevate legs to the level of the heart and pump ankles as often as possible Wound #2 Left,Medial Lower Leg o Elevate legs to the level of the heart and pump ankles as often as possible Additional Orders / Instructions Wound #1 Left,Lateral Lower Leg o Stop Smoking - continue to o Increase protein intake. Wound #2 Left,Medial Lower Leg o Stop Smoking - continue to o Increase protein intake. Medications-please add to medication list. Wound #1 Left,Lateral Lower Leg o Other: - Vitamin C, Vitamin A, Zinc Wound #2 Left,Medial Lower Leg o Santyl Enzymatic Ointment o Other: - Vitamin C, Vitamin A, Zinc Patient Medications Allergies: Ceftin, myocin, penicillin, sulfur, Darvocet-N, Percocet, red dye, shell fish, Iodinated Contrast- Oral and IV Dye, Phenergan, saccharin, Aminoglycosides, latex, influenza vaccines, Cefotan, cefuroxime Notifications Medication Indication Start End Santyl 12/21/2016 DOSE topical 250 unit/gram ointment - ointment topical as directed Electronic Signature(s) Signed: 12/21/2016 9:11:16 AM By: Christin Fudge MD, FACS Entered By: Christin Fudge on 12/21/2016 09:11:16 [name], [name] ([id number]) -------------------------------------------------------------------------------- Prescription 12/21/2016 Patient Name: [name], Karilynn R. Rx Reference #: Date of Birth: May 27, 1960 Provider: Christin Fudge MD Sex: F NPI#: [id number] Phone  #: [phone] DEA#: [id number] License #: Patient Address: [place], [place] [address] [facility] [address] [place], [place] [address] [id number] Allergies Ceftin myocin penicillin sulfur Darvocet-N Percocet red dye shell fish Iodinated Contrast- Oral and IV Dye Phenergan saccharin Aminoglycosides latex influenza vaccines Cefotan cefuroxime Sibley, Cathleen R. ([id number]) Medication Medication: Route: Strength: Form: Santyl topical 250 unit/gram ointment Class: TOPICAL/MUCOUS MEMBR./SUBCUT. ENZYMES Dose: Frequency / Time: Indication: ointment topical as directed Number of Refills: Number of Units: 0 Thirty (30) Gram(s) Generic Substitution: Start Date: End Date: Administered at Substitution Permitted 17/05/1023 Facility: No Note to Pharmacy: Signature(s): Date(s): Electronic Signature(s) Signed: 12/21/2016 4:19:51 PM By: Christin Fudge MD, FACS Entered By: Christin Fudge on 12/21/2016 09:11:17 [name], [name] ([id number]) --------------------------------------------------------------------------------  Problem List Details Patient Name: [name], [name] Date of Service: 12/21/2016 8:00 AM Medical Record Number: [id number] Patient Account Number: [id number] Date of Birth/Sex: October 13, 1960 (56 y.o. Female) Treating RN: Ahmed Prima Primary Care Provider: Tracie Harrier Other Clinician: Referring Provider: Tracie Harrier Treating Provider/Extender: Frann Rider in Treatment: 4 Active Problems ICD-10 Encounter Code  Description Active Date Diagnosis 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d 11/23/2016 Yes S81.812A Laceration without foreign body, left lower leg, initial encounter 11/23/2016 Yes E66.01 Morbid (severe) obesity due to excess calories 11/23/2016 Yes F17.218 Nicotine dependence, cigarettes, with other  nicotine-induced 11/23/2016 Yes disorders S81.852A Open bite, left lower leg, initial encounter 12/21/2016 Yes 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d 12/21/2016 Yes Inactive Problems Resolved Problems Electronic Signature(s) Signed: 12/21/2016 9:08:20 AM By: Christin Fudge MD, FACS Entered By: Christin Fudge on 12/21/2016 09:08:20 [name], [name] ([id number]) -------------------------------------------------------------------------------- Progress Note Details Patient Name: [name], Davonna R. Date of Service: 12/21/2016 8:00 AM Medical Record Number: [id number] Patient Account Number: [id number] Date of Birth/Sex: Nov 24, 1960 (56 y.o. Female) Treating RN: Ahmed Prima Primary Care Provider: Tracie Harrier Other Clinician: Referring Provider: Tracie Harrier Treating Provider/Extender: Frann Rider in Treatment: 4 Subjective Chief Complaint Information obtained from Patient Patient presents to the wound care center with open non-healing surgical wound(s) to the left lower leg which she's had for about a month History of Present Illness (HPI) The following HPI elements were documented for the patient's wound: Location: Patient presents with a wound to left lower leg. Quality: Patient reports experiencing a sharp pain to affected area(s). Severity: Patient states wound are getting worse. Duration: Patient has had the wound for < 4 weeks prior to presenting for treatment Timing: Pain in wound is Intermittent (comes and goes Context: The wound occurred when the patient had a laceration with a sharp object Modifying Factors: Other treatment(s) tried include:suturing done in the ER and sutures removed on 2 weeks Associated Signs and Symptoms: Patient reports having increase discharge. 56 year old patient seen by her PCP Dr. Ginette Pitman, for a follow-up of her left leg lacerated wound had sutures removed a week before she was seen on 11/15/2016. there was  swelling and erythema and the edges of the laceration had not completely healed and she had completed a course of doxycycline. She had quit smoking about a year ago. Past medical history significant for anxiety states, asthma, endometriosis, GERD, obesity, sleep apnea, status post appendectomy, cardiac catheterization, cholecystectomy, multiple colonoscopies and EGDs, hysterectomy, knee arthroscopies. she recently restarted smoking after quitting for over 20 years 12/21/2016 -- she now presents with a new wound to the left lower third of her leg where her dog bit her and caused a puncture wound which is now been draining and has some necrotic debris. She was seen by her PCP and he has put her on doxycycline orally. She has given up smoking this last week. Patient History Information obtained from Patient. Family History Cancer - Father, Diabetes - Mother,Siblings, Heart Disease - Mother,Siblings, Hypertension - Mother,Father,Siblings, Lung Disease - Mother,Father,Siblings, Stroke - Mother,Father, Thyroid Problems - Father,Siblings, No family history of Hereditary Spherocytosis, Kidney Disease, Seizures, Tuberculosis. Social History Current every day smoker - 3 cigarettes a day, Marital Status - Married, Alcohol Use - Never, Drug Use - No History, Caffeine Use - Daily. Torelli, Raeshawn R. ([id number]) Objective Constitutional Pulse regular. Respirations normal and unlabored. Afebrile. Vitals Time Taken: 8:12 AM, Height: 65 in, Weight: 294.4 lbs, BMI: 49, Temperature: 97.6 F, Pulse: 74 bpm, Respiratory Rate: 20 breaths/min, Blood Pressure: 137/98 mmHg. Eyes Nonicteric. Reactive to light. Ears, Nose, Mouth, and Throat Lips, teeth, and gums WNL.Marland Kitchen Moist mucosa without lesions. Neck supple and nontender. No palpable supraclavicular or cervical adenopathy. Normal sized without goiter. Respiratory WNL. No retractions.. Cardiovascular Pedal Pulses WNL. No clubbing, cyanosis or  edema.  Lymphatic No adneopathy. No adenopathy. No adenopathy. Musculoskeletal Adexa without tenderness or enlargement.. Digits and nails w/o clubbing, cyanosis, infection, petechiae, ischemia, or inflammatory conditions.Marland Kitchen Psychiatric Judgement and insight Intact.. No evidence of depression, anxiety, or agitation.. General Notes: the original wound on the left upper calf continues to have minimal debris which was sharply debrided with a #3 curet. The new puncture wound on the lower third of her left leg is fairly deep with necrotic debris and using a #3 curet to sharply remove this and this is deep down into the subcutaneous tissue. There is no evidence of gross cellulitis or abscess formation. Integumentary (Hair, Skin) No suspicious lesions. No crepitus or fluctuance. No peri-wound warmth or erythema. No masses.. Wound #1 status is Open. Original cause of wound was Trauma. The wound is located on the Left,Lateral Lower Leg. The wound measures 2.4cm length x 1.5cm width x 0.1cm depth; 2.827cm^2 area and 0.283cm^3 volume. There is no tunneling or undermining noted. There is a large amount of serosanguineous drainage noted. The wound margin is distinct with the outline attached to the wound base. There is small (1-33%) red granulation within the wound bed. There is a large (67-100%) amount of necrotic tissue within the wound bed including Adherent Slough. The periwound skin appearance exhibited: Scarring, Maceration. Periwound temperature was noted as No Abnormality. The periwound has tenderness on palpation. Wound #2 status is Open. Original cause of wound was Trauma. The wound is located on the Left,Medial Lower Leg. The wound measures 0.7cm length x 0.7cm width x 0.1cm depth; 0.385cm^2 area and 0.038cm^3 volume. There is no tunneling or undermining noted. There is a large amount of serous drainage noted. The wound margin is flat and intact. There is no Mapp, Charlynn R.  ([id number]) granulation within the wound bed. There is a large (67-100%) amount of necrotic tissue within the wound bed including Adherent Slough. The periwound skin appearance exhibited: Maceration, Erythema. The surrounding wound skin color is noted with erythema which is circumferential. Periwound temperature was noted as No Abnormality. 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 There was a Skin/Subcutaneous Tissue Debridement ([id number]) debridement with total area of 3.6 sq cm performed by Christin Fudge, MD. with the following instrument(s): Curette to remove Viable and Non-Viable tissue/material including Exudate, Fibrin/Slough, and Subcutaneous after achieving pain control using Lidocaine 4% Topical Solution. A time out was conducted at 08:23, prior to the start of the procedure. A Minimum amount of bleeding was controlled with Pressure. The procedure was tolerated well with a pain level of 0 throughout and a pain level of 0 following the procedure. Post Debridement Measurements: 2.4cm length x 1.5cm width x 0.2cm depth; 0.565cm^3 volume. Character of Wound/Ulcer Post Debridement requires further debridement. Severity of Tissue Post Debridement is: Fat layer exposed. Post procedure Diagnosis Wound #1: Same as Pre-Procedure Wound #2 Pre-procedure diagnosis of Wound #2 is a Trauma, Other located on  the Left,Medial Lower Leg . There was a Skin/Subcutaneous Tissue Debridement ([id number]) debridement with total area of  0.49 sq cm performed by Christin Fudge, MD. with the following instrument(s): Curette to remove Viable and Non-Viable tissue/material including Exudate, Fibrin/Slough, and Subcutaneous after achieving pain control using Lidocaine 4% Topical Solution. A time out was conducted at 08:23, prior to the start of the procedure. A Minimum amount of bleeding was controlled with Pressure. The procedure was tolerated well with a pain level of 0 throughout and a pain level of 0 following the procedure. Post Debridement Measurements: 0.7cm length x 0.7cm width x 0.2cm depth; 0.077cm^3 volume. Character of Wound/Ulcer Post Debridement requires further debridement. Post procedure Diagnosis Wound #2: Same as Pre-Procedure Plan Wound Cleansing: Mara, Christiann R. ([id number]) Wound #1 Left,Lateral Lower Leg: Clean wound with Normal Saline. Cleanse wound with mild soap and water May Shower, gently pat wound dry prior to applying new dressing. May shower with protection. Wound #2 Left,Medial Lower Leg: Clean wound with Normal Saline. Cleanse wound with mild soap and water May Shower, gently pat wound dry prior to applying new dressing. May shower with protection. Anesthetic: Wound #1 Left,Lateral Lower Leg: Topical Lidocaine 4% cream applied to wound bed prior to debridement Wound #2 Left,Medial Lower Leg: Topical Lidocaine 4% cream applied to wound bed prior to debridement Skin Barriers/Peri-Wound Care: Wound #1 Left,Lateral Lower Leg: Skin Prep Wound #2 Left,Medial Lower Leg: Skin Prep Primary Wound Dressing: Wound #1 Left,Lateral Lower Leg: Prisma Ag Wound #2 Left,Medial Lower Leg: Santyl Ointment Secondary Dressing: Wound #1 Left,Lateral Lower Leg: Dry Gauze Other - telfa island Wound #2 Left,Medial Lower Leg: Dry Gauze Other - telfa island Dressing Change  Frequency: Wound #1 Left,Lateral Lower Leg: Change dressing every other day. Wound #2 Left,Medial Lower Leg: Change dressing every day. Follow-up Appointments: Wound #1 Left,Lateral Lower Leg: Return Appointment in 1 week. Wound #2 Left,Medial Lower Leg: Return Appointment in 1 week. Edema Control: Wound #1 Left,Lateral Lower Leg: Elevate legs to the level of the heart and pump ankles as often as possible Wound #2 Left,Medial Lower Leg: Elevate legs to the level of the heart and pump ankles as often as possible Additional Orders / Instructions: Wound #1 Left,Lateral Lower Leg: Stop Smoking - continue to Increase protein intake. Wound #2 Left,Medial Lower Leg: Stop Smoking - continue to Increase protein intake. Medications-please add to medication list.: Wound #1 Left,Lateral Lower Leg: Other: - Vitamin C, Vitamin A, Zinc Wound #2 Left,Medial Lower Leg: Santyl Enzymatic Ointment Other: - Vitamin C, Vitamin A, Zinc Jessee, Lileigh R. ([id number]) The following medication(s) was prescribed: Santyl topical 250 unit/gram ointment ointment topical as directed starting 12/21/2016 After sharp debridement in review of her new wound on the left lower extremity,I have recommended: 1. Silver collagen and a bordered foam to be changed daily, to the left upper wound 2. Santyl ointment to the new wound to be changed daily 3. Adequate protein, vitamin A, vitamin C and zinc 4. command in her and completely giving up smoking 5. Regular visits wound center She has had all questions answered and says she will be compliant Electronic Signature(s) Signed: 12/21/2016 9:12:26 AM By: Christin Fudge MD, FACS Entered By: Christin Fudge on 12/21/2016 09:12:25 Groninger, Gerldine R. ([id number]) -------------------------------------------------------------------------------- ROS/PFSH Details Patient Name: Letarte, Denina R. Date of Service: 12/21/2016 8:00 AM Medical Record Number: [id number] Patient Account Number:  [id number] Date of Birth/Sex: 08/31/1960 (56 y.o. Female) Treating RN: Ahmed Prima Primary Care Provider: Tracie Harrier Other Clinician: Referring Provider: Tracie Harrier Treating Provider/Extender: Frann Rider in Treatment: 4 Information Obtained From Patient Wound History Do you currently have  one or more open woundso Yes How many open wounds do you currently haveo 1 Approximately how long have you had your woundso 4 weeks How have you been treating your wound(s) until nowo saline and neosporin Has your wound(s) ever healed and then re-openedo No Have you had any lab work done in the past montho No Have you tested positive for an antibiotic resistant organism (MRSA, VRE)o No Have you tested positive for osteomyelitis (bone infection)o No Have you had any tests for circulation on your legso Yes Who ordered the testo dr. dew [facility] the test [facility] Have you had other problems associated with your woundso Swelling Respiratory Medical History: Positive for: Asthma; Sleep Apnea Cardiovascular Medical History: Positive for: Hypertension Musculoskeletal Medical History: Positive for: Osteoarthritis Immunizations Pneumococcal Vaccine: Received Pneumococcal Vaccination: Yes Implantable Devices Family and Social History Cancer: Yes - Father; Diabetes: Yes - Mother,Siblings; Heart Disease: Yes - Mother,Siblings; Hereditary Spherocytosis: No; Hypertension: Yes - Mother,Father,Siblings; Kidney Disease: No; Lung Disease: Yes - Mother,Father,Siblings; Seizures: No; Stroke: Yes - Mother,Father; Thyroid Problems: Yes - Father,Siblings; Tuberculosis: No; Current every day smoker - 3 cigarettes a day; Marital Status - Married; Alcohol Use: Never; Drug Use: No History; Caffeine Use: Daily; Financial Concerns: No; Food, Clothing or Shelter Needs: No; Support System Lacking: No; Transportation Concerns: No; Advanced Directives: No; Patient does not want information on  Advanced Directives; Do not resuscitate: No; Living Will: No; Medical Power of Attorney: No Physician Affirmation I have reviewed and agree with the above information. TOMIKA, ECKLES ([id number]) Electronic Signature(s) Signed: 12/21/2016 4:19:51 PM By: Christin Fudge MD, FACS Signed: 12/21/2016 4:42:16 PM By: Alric Quan Entered By: Christin Fudge on 12/21/2016 09:09:45 Twombly, Jayelle R. ([id number]) -------------------------------------------------------------------------------- [place]Bill Details Patient Name: Mcclaine, Kalayla R. Date of Service: 12/21/2016 Medical Record Number: [id number] Patient Account Number: [id number] Date of Birth/Sex: March 22, 1960 (56 y.o. Female) Treating RN: Carolyne Fiscal, Debi Primary Care Provider: Tracie Harrier Other Clinician: Referring Provider: Tracie Harrier Treating Provider/Extender: Frann Rider in Treatment: 4 Diagnosis Coding ICD-10 Codes Code Description [phone] Non-pressure 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 exposed S81.812A Laceration without foreign body, left lower leg, initial encounter E66.01 Morbid (severe) obesity due to excess calories F17.218 Nicotine dependence, cigarettes, with other nicotine-induced disorders S81.852A Open bite, left lower leg, initial encounter 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d Facility Procedures CPT4 Code: 89381017 Description: 11042 - DEB SUBQ TISSUE 20 SQ CM/< ICD-10 Diagnosis Description 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 S81.852A Open bite, left lower leg, initial encounter P10.258N Laceration without foreign body, left lower  leg, initial enco Modifier: d unter Quantity: 1 Physician Procedures CPT4 Code: 2778242 Description: 35361 - WC PHYS LEVEL 3 - EST PT ICD-10 Diagnosis Description 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 S81.812A Laceration without foreign body, left lower leg, initial  enco E66.01 Morbid (severe) obesity due to  excess calories S81.852A Open bite, left lower leg, initial encounter Modifier: 25 d unter Quantity: 1 CPT4 Code: 4431540 Description: 11042 - WC PHYS SUBQ TISS 20 SQ CM ICD-10 Diagnosis Description 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

## 2016-12-28 ENCOUNTER — Encounter: Payer: Medicare Other | Admitting: Surgery

## 2016-12-28 DIAGNOSIS — L97222 Non-pressure 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 exposed: Secondary | ICD-10-CM | POA: Diagnosis not present

## 2016-12-29 NOTE — Progress Notes (Signed)
ROME, SCHLAUCH ([id number]) Visit Report for 12/21/2016 Arrival Information Details Patient Name: [name], [name]. Date of Service: 12/21/2016 8:00 AM Medical Record Number: [id number] Patient Account Number: [id number] Date of Birth/Sex: 1960/11/09 (56 y.o. Female) Treating RN: [name] Primary Care Eulogia Dismore: [name] Other Clinician: Referring [name]: [name] Treating [name]/Extender: [name] in Treatment: 4 Visit Information History Since Last Visit All ordered tests and consults were completed: No Patient Arrived: Ambulatory Added or deleted any medications: No Arrival Time: 08:02 Any new allergies or adverse reactions: No Accompanied By: self Had a fall or experienced change in No Transfer Assistance: None activities of daily living that may affect Patient Identification Verified: Yes risk of falls: Secondary Verification Process Completed: Yes Signs or symptoms of abuse/neglect since last visito No Patient Requires Transmission-Based No Hospitalized since last visit: No Precautions: Has Dressing in Place as Prescribed: Yes Patient Has Alerts: No Pain Present Now: No Electronic Signature(s) Signed: 12/21/2016 4:42:16 PM By: [name] Entered By: [name] on 12/21/2016 08:12:11 [name], [name]. ([id number]) -------------------------------------------------------------------------------- Encounter Discharge Information Details Patient Name: [name]. Date of Service: 12/21/2016 8:00 AM Medical Record Number: [id number] Patient Account Number: [id number] Date of Birth/Sex: March 16, 1960 (56 y.o. Female) Treating RN: [name] Primary Care Yunis Voorheis: [name] Other Clinician: Referring [name]: [name] Treating [name]/Extender: [name] in Treatment: 4 Encounter Discharge Information Items Discharge Pain Level: 0 Discharge Condition: Stable Ambulatory Status:  Ambulatory Discharge Destination: Home Transportation: Private Auto Accompanied By: self Schedule Follow-up Appointment: Yes Medication Reconciliation completed and No provided to Patient/Care Citlalli Weikel: Provided on Clinical Summary of Care: 12/21/2016 Form Type Recipient Paper Patient RD Electronic Signature(s) Signed: 12/27/2016 9:14:59 AM By: [name] Entered By: [name] on 12/21/2016 08:43:15 [name], [name]. ([id number]) -------------------------------------------------------------------------------- Lower Extremity Assessment Details Patient Name: [name], [name]. Date of Service: 12/21/2016 8:00 AM Medical Record Number: [id number] Patient Account Number: [id number] Date of Birth/Sex: Sep 20, 1960 (56 y.o. Female) Treating RN: [name] Primary Care Yamilee Harmes: [name] Other Clinician: Referring [name]: [name] Treating Amiyah Shryock/Extender: [name] in Treatment: 4 Vascular Assessment Pulses: Dorsalis Pedis Palpable: [Left:Yes] Posterior Tibial Extremity colors, hair growth, and conditions: Extremity Color: [Left:Mottled] Temperature of Extremity: [Left:Warm] Capillary Refill: [Left:< 3 seconds] [Right:< 3 seconds] Toe Nail Assessment Left: Right: Thick: No Discolored: No Deformed: No Improper Length and Hygiene: No Electronic Signature(s) Signed: 12/21/2016 4:42:16 PM By: [name] Entered By: [name] on 12/21/2016 08:19:32 [name], [name]. ([id number]) -------------------------------------------------------------------------------- Multi Wound Chart Details Patient Name: [name], [name]. Date of Service: 12/21/2016 8:00 AM Medical Record Number: [id number] Patient Account Number: [id number] Date of Birth/Sex: 1960-08-08 (56 y.o. Female) Treating RN: [name] Primary Care Khalie Wince: [name] Other Clinician: Referring Alfonsa Vaile: [name] Treating Avea Mcgowen/Extender: [name] in Treatment: 4 Vital Signs Height(in): 65 Pulse(bpm): 86 Weight(lbs): 294.4 Blood Pressure(mmHg): 137/98 Body Mass Index(BMI): 49 Temperature(F): 97.6 Respiratory Rate 20 (breaths/min): Photos: [1:No Photos] [2:No Photos] [N/A:N/A] Wound Location: [1:Left Lower Leg - Lateral] [2:Left Lower Leg - Medial] [N/A:N/A] Wounding Event: [1:Trauma] [2:Trauma] [N/A:N/A] Primary Etiology: [1:Arterial Insufficiency Ulcer] [2:Trauma, Other] [N/A:N/A] Secondary Etiology: [1:Trauma, Other] [2:N/A] [N/A:N/A] Comorbid History: [1:Asthma, Sleep Apnea, Hypertension, Osteoarthritis] [2:Asthma, Sleep Apnea, Hypertension, Osteoarthritis] [N/A:N/A] Date Acquired: [1:10/26/2016] [2:12/16/2016] [N/A:N/A] Weeks of Treatment: [1:4] [2:0] [N/A:N/A] Wound Status: [1:Open] [2:Open] [N/A:N/A] Measurements L x W x D [1:2.4x1.5x0.1] [2:0.7x0.7x0.1] [N/A:N/A] (cm) Area (cm) : [1:2.827] [2:0.385] [N/A:N/A] Volume (cm) : [1:0.283] [2:0.038] [N/A:N/A] % Reduction in Area: [1:61.70%] [2:N/A] [N/A:N/A] % Reduction in Volume: [  1:61.70%] [2:N/A] [N/A:N/A] Classification: [1:Partial Thickness] [2:Partial Thickness] [N/A:N/A] Exudate Amount: [1:Large] [2:Large] [N/A:N/A] Exudate Type: [1:Serosanguineous] [2:Serous] [N/A:N/A] Exudate Color: [1:red, brown] [2:amber] [N/A:N/A] Wound Margin: [1:Distinct, outline attached] [2:Flat and Intact] [N/A:N/A] Granulation Amount: [1:Small (1-33%)] [2:None Present (0%)] [N/A:N/A] Granulation Quality: [1:Red] [2:N/A] [N/A:N/A] Necrotic Amount: [1:Large (67-100%)] [2:Large (67-100%)] [N/A:N/A] Epithelialization: [1:None] [2:None] [N/A:N/A] Debridement: [1:Debridement ([id number])] [2:Debridement (51025-85277)] [N/A:N/A] Pre-procedure [1:08:23] [2:08:23] [N/A:N/A] Verification/Time Out Taken: Pain Control: [1:Lidocaine 4% Topical Solution] [2:Lidocaine 4% Topical Solution] [N/A:N/A] Tissue Debrided: [1:Fibrin/Slough, Exudates, Subcutaneous] [2:Fibrin/Slough,  Exudates, Subcutaneous] [N/A:N/A] Level: [1:Skin/Subcutaneous Tissue] [2:Skin/Subcutaneous Tissue] [N/A:N/A] Debridement Area (sq cm): [1:3.6] [2:0.49] [N/A:N/A] Instrument: [1:Curette] [2:Curette] [N/A:N/A] Bleeding: [1:Minimum] [2:Minimum] [N/A:N/A] Hemostasis Achieved: [1:Pressure] [2:Pressure] [N/A:N/A] Procedural Pain: 0 0 N/A Post Procedural Pain: 0 0 N/A Debridement Treatment Procedure was tolerated well Procedure was tolerated well N/A Response: Post Debridement 2.4x1.5x0.2 0.7x0.7x0.2 N/A Measurements L x W x D (cm) Post Debridement Volume: 0.565 0.077 N/A (cm) Periwound Skin Texture: Scarring: Yes No Abnormalities Noted N/A Periwound Skin Moisture: Maceration: Yes Maceration: Yes N/A Periwound Skin Color: No Abnormalities Noted Erythema: Yes N/A Erythema Location: N/A Circumferential N/A Temperature: No Abnormality No Abnormality N/A Tenderness on Palpation: Yes No N/A Wound Preparation: Ulcer Cleansing: Ulcer Cleansing: N/A Rinsed/Irrigated with Saline Rinsed/Irrigated with Saline Topical Anesthetic Applied: Topical Anesthetic Applied: Other: lidocaine 4% Other: lidocaine 4% Procedures Performed: Debridement Debridement N/A Treatment Notes Wound #1 (Left, Lateral Lower Leg) 1. Cleansed with: Clean wound with Normal Saline 2. Anesthetic Topical Lidocaine 4% cream to wound bed prior to debridement 3. Peri-wound Care: Skin Prep 4. Dressing Applied: Prisma Ag 5. Secondary Chestnut Wound #2 (Left, Medial Lower Leg) 1. Cleansed with: Clean wound with Normal Saline 2. Anesthetic Topical Lidocaine 4% cream to wound bed prior to debridement 3. Peri-wound Care: Skin Prep 4. Dressing Applied: Santyl Ointment 5. Secondary Dressing Applied Dry [place] Signature(s) Signed: 12/21/2016 9:08:25 AM By: Christin Fudge MD, FACS Entered By: Christin Fudge on 12/21/2016 09:08:25 Mcenaney, Asenath [name].  ([id number]) Balbi, Soo RMarland Kitchen ([id number]) -------------------------------------------------------------------------------- [place] Details Patient Name: [name], [name]. Date of Service: 12/21/2016 8:00 AM Medical Record Number: [id number] Patient Account Number: [id number] Date of Birth/Sex: 01-31-1960 (56 y.o. Female) Treating RN: [name] Primary Care Adelaida Reindel: [name] Other Clinician: Referring Laneshia Pina: [name] Treating Brealynn Contino/Extender: [name] in Treatment: 4 Active Inactive ` Abuse / Safety / Falls / Self Care Management Nursing Diagnoses: History of Falls Potential for falls Goals: Patient will not experience any injury related to falls Date Initiated: 11/23/2016 Target Resolution Date: 03/24/2017 Goal Status: Active Interventions: Assess Activities of Daily Living upon admission and as needed Assess fall risk on admission and as needed Assess: immobility, friction, shearing, incontinence upon admission and as needed Notes: ` Nutrition Nursing Diagnoses: Imbalanced nutrition Potential for alteratiion in Nutrition/Potential for imbalanced nutrition Goals: Patient/caregiver agrees to and verbalizes understanding of need to use nutritional supplements and/or vitamins as prescribed Date Initiated: 11/23/2016 Target Resolution Date: 03/24/2017 Goal Status: Active Interventions: Assess patient nutrition upon admission and as needed per policy Notes: ` Orientation to the Wound Care Program Nursing Diagnoses: Knowledge deficit related to the wound healing center program Goals: Patient/caregiver will verbalize understanding of the [place], [place]. ([id number]) Date Initiated: 11/23/2016 Target Resolution Date: 12/23/2016 Goal Status: Active Interventions: Provide education on orientation to the wound center Notes: ` Pain, Acute or Chronic Nursing Diagnoses: Pain, acute or chronic:  actual or potential Potential alteration in comfort, pain  Goals: Patient/caregiver will verbalize adequate pain control between visits Date Initiated: 11/23/2016 Target Resolution Date: 02/24/2017 Goal Status: Active Interventions: Complete pain assessment as per visit requirements Notes: ` Wound/Skin Impairment Nursing Diagnoses: Impaired tissue integrity Knowledge deficit related to smoking impact on wound healing Knowledge deficit related to ulceration/compromised skin integrity Goals: Ulcer/skin breakdown will have a volume reduction of 80% by week 12 Date Initiated: 11/23/2016 Target Resolution Date: 03/24/2017 Goal Status: Active Interventions: Assess patient/caregiver ability to perform ulcer/skin care regimen upon admission and as needed Assess ulceration(s) every visit Provide education on smoking Notes: Electronic Signature(s) Signed: 12/21/2016 4:42:16 PM By: [name] Entered By: [name] on 12/21/2016 08:19:37 Straley, Kamil [name]. ([id number]) -------------------------------------------------------------------------------- Pain Assessment Details Patient Name: [name], [name]. Date of Service: 12/21/2016 8:00 AM Medical Record Number: [id number] Patient Account Number: [id number] Date of Birth/Sex: 1960/11/15 (56 y.o. Female) Treating RN: [name] Primary Care Sumi Lye: [name] Other Clinician: Referring Auriah Hollings: [name] Treating Glenmore Karl/Extender: [name] in Treatment: 4 Active Problems Location of Pain Severity and Description of Pain Patient Has Paino No Site Locations Pain Management and Medication Current Pain Management: Electronic Signature(s) Signed: 12/21/2016 4:42:16 PM By: [name] Entered By: [name] on 12/21/2016 08:12:17 Erler, Areyanna [name]. ([id number]) -------------------------------------------------------------------------------- Patient/Caregiver Education Details Patient Name:  Borgen, Sianne [name]. Date of Service: 12/21/2016 8:00 AM Medical Record Number: [id number] Patient Account Number: [id number] Date of Birth/Gender: 01-Jan-1961 (56 y.o. Female) Treating RN: Ahmed Prima Primary Care Physician: [name] Other Clinician: Referring Physician: Tracie [name] Treating Physician/Extender: [name] in Treatment: 4 Education Assessment Education Provided To: Patient Education Topics Provided Wound/Skin Impairment: Handouts: Other: change dressing as ordered Methods: Demonstration, Explain/Verbal Responses: State content correctly Electronic Signature(s) Signed: 12/21/2016 4:42:16 PM By: [name] Entered By: [name] on 12/21/2016 08:41:34 [name], [name]. ([id number]) -------------------------------------------------------------------------------- Wound Assessment Details Patient Name: [name], [name]. Date of Service: 12/21/2016 8:00 AM Medical Record Number: [id number] Patient Account Number: [id number] Date of Birth/Sex: [birth year] (56 y.o. Female) Treating RN: [name] Primary Care Tkeya Stencil: [name] Other Clinician: Referring Lindel Marcell: [name] Treating Woodward Klem/Extender: [name] in Treatment: 4 Wound Status Wound Number: 1 Primary Etiology: Arterial Insufficiency Ulcer Wound Location: Left Lower Leg - Lateral Secondary Trauma, Other Etiology: Wounding Event: Trauma Wound Status: Open Date Acquired: 10/26/2016 Comorbid History: Asthma, Sleep Apnea, Hypertension, Weeks Of Treatment: 4 Osteoarthritis Clustered Wound: No Photos Photo Uploaded By: [name] on 12/21/2016 09:36:58 Wound Measurements Length: (cm) 2.4 Width: (cm) 1.5 Depth: (cm) 0.1 Area: (cm) 2.827 Volume: (cm) 0.283 % Reduction in Area: 61.7% % Reduction in Volume: 61.7% Epithelialization: None Tunneling: No Undermining: No Wound Description Classification: Partial Thickness Wound Margin:  Distinct, outline attached Exudate Amount: Large Exudate Type: Serosanguineous Exudate Color: red, brown Foul Odor After Cleansing: No Slough/Fibrino Yes Wound Bed Granulation Amount: Small (1-33%) Granulation Quality: Red Necrotic Amount: Large (67-100%) Necrotic Quality: Adherent Slough Periwound Skin Texture Texture Color No Abnormalities Noted: No No Abnormalities Noted: No Scarring: Yes Temperature / Pain Igou, Seraphim [name]. ([id number]) Moisture Temperature: No Abnormality No Abnormalities Noted: No Tenderness on Palpation: Yes Maceration: Yes Wound Preparation Ulcer Cleansing: Rinsed/Irrigated with Saline Topical Anesthetic Applied: Other: lidocaine 4%, Treatment Notes Wound #1 (Left, Lateral Lower Leg) 1. Cleansed with: Clean wound with Normal Saline 2. Anesthetic Topical Lidocaine 4% cream to wound bed prior to debridement 3. Peri-wound Care: Skin Prep 4. Dressing Applied: Prisma Ag 5. Secondary Dressing Applied Dry [place] Signature(s) Signed: 12/21/2016 4:42:16 PM By: [name]  Entered By: [name] on 12/21/2016 08:17:32 Cupo, Lanecia [name]. ([id number]) -------------------------------------------------------------------------------- Wound Assessment Details Patient Name: [name], [name]. Date of Service: 12/21/2016 8:00 AM Medical Record Number: [id number] Patient Account Number: [id number] Date of Birth/Sex: 05-Jan-1961 (56 y.o. Female) Treating RN: [name] Primary Care Eastin Swing: [name] Other Clinician: Referring Kyshon Tolliver: [name] Treating Rama Sorci/Extender: [name] in Treatment: 4 Wound Status Wound Number: 2 Primary Trauma, Other Etiology: Wound Location: Left Lower Leg - Medial Wound Status: Open Wounding Event: Trauma Comorbid Asthma, Sleep Apnea, Hypertension, Date Acquired: 12/16/2016 History: Osteoarthritis Weeks Of Treatment: 0 Clustered Wound: No Photos Photo Uploaded  By: [name] on 12/21/2016 09:36:59 Wound Measurements Length: (cm) 0.7 Width: (cm) 0.7 Depth: (cm) 0.1 Area: (cm) 0.385 Volume: (cm) 0.038 % Reduction in Area: % Reduction in Volume: Epithelialization: None Tunneling: No Undermining: No Wound Description Classification: Partial Thickness Wound Margin: Flat and Intact Exudate Amount: Large Exudate Type: Serous Exudate Color: amber Foul Odor After Cleansing: No Slough/Fibrino Yes Wound Bed Granulation Amount: None Present (0%) Exposed Structure Necrotic Amount: Large (67-100%) Fascia Exposed: No Necrotic Quality: Adherent Slough Fat Layer (Subcutaneous Tissue) Exposed: No Tendon Exposed: No Muscle Exposed: No Joint Exposed: No Bone Exposed: No Periwound Skin Texture Inskeep, Latana [name]. ([id number]) Texture Color No Abnormalities Noted: No No Abnormalities Noted: No Erythema: Yes Moisture Erythema Location: Circumferential No Abnormalities Noted: No Maceration: Yes Temperature / Pain Temperature: No Abnormality Wound Preparation Ulcer Cleansing: Rinsed/Irrigated with Saline Topical Anesthetic Applied: Other: lidocaine 4%, Treatment Notes Wound #2 (Left, Medial Lower Leg) 1. Cleansed with: Clean wound with Normal Saline 2. Anesthetic Topical Lidocaine 4% cream to wound bed prior to debridement 3. Peri-wound Care: Skin Prep 4. Dressing Applied: Santyl Ointment 5. Secondary Dressing Applied Dry [place] Signature(s) Signed: 12/21/2016 4:42:16 PM By: [name] Entered By: [name] on 12/21/2016 08:16:43 [name], [name]. ([id number]) -------------------------------------------------------------------------------- [place] Details Patient Name: [name], [name]. Date of Service: 12/21/2016 8:00 AM Medical Record Number: [id number] Patient Account Number: [id number] Date of Birth/Sex: Mar 30, 1960 (56 y.o. Female) Treating RN: [name] Primary Care Kaetlyn Noa: [name] Other Clinician: Referring Bernarr Longsworth: [name] Treating Yasmin Dibello/Extender: [name] in Treatment: 4 Vital Signs Time Taken: 08:12 Temperature (F): 97.6 Height (in): 65 Pulse (bpm): 74 Weight (lbs): 294.4 Respiratory Rate (breaths/min): 20 Body Mass Index (BMI): 49 Blood Pressure (mmHg): 137/98 Reference Range: 80 - 120 mg / dl Electronic Signature(s) Signed: 12/21/2016 4:42:16 PM By: [name] Entered By: [name] on 12/21/2016 08:13:35

## 2016-12-30 NOTE — Progress Notes (Signed)
KAICEE, SCARPINO ([id number]) Visit Report for 12/28/2016 Arrival Information Details Patient Name: Formby, Azrael R. Date of Service: 12/28/2016 8:00 AM Medical Record Number: [id number] Patient Account Number: [id number] Date of Birth/Sex: March 16, 1960 (56 y.o. Female) Treating RN: Carolyne Fiscal, Debi Primary Care Aizik Reh: Tracie Harrier Other Clinician: Referring Kristie Bracewell: Tracie Harrier Treating Rabia Argote/Extender: Frann Rider in Treatment: 5 Visit Information History Since Last Visit All ordered tests and consults were completed: No Patient Arrived: Ambulatory Added or deleted any medications: No Arrival Time: 08:03 Any new allergies or adverse reactions: No Accompanied By: self Had a fall or experienced change in No Transfer Assistance: None activities of daily living that may affect Patient Identification Verified: Yes risk of falls: Secondary Verification Process Completed: Yes Signs or symptoms of abuse/neglect since last visito No Patient Requires Transmission-Based No Hospitalized since last visit: No Precautions: Has Dressing in Place as Prescribed: Yes Patient Has Alerts: No Pain Present Now: No Electronic Signature(s) Signed: 12/28/2016 4:48:36 PM By: Alric Quan Entered By: Alric Quan on 12/28/2016 08:17:56 Alves, Hermine R. ([id number]) -------------------------------------------------------------------------------- Encounter Discharge Information Details Patient Name: Bagsby, Lupita R. Date of Service: 12/28/2016 8:00 AM Medical Record Number: [id number] Patient Account Number: [id number] Date of Birth/Sex: March 16, 1960 (56 y.o. Female) Treating RN: Carolyne Fiscal, Debi Primary Care Shellie Rogoff: Tracie Harrier Other Clinician: Referring Sinahi Knights: Tracie Harrier Treating Lavarr President/Extender: Frann Rider in Treatment: 5 Encounter Discharge Information Items Discharge Pain Level: 0 Discharge Condition: Stable Ambulatory Status:  Ambulatory Discharge Destination: Home Transportation: Private Auto Accompanied By: self Schedule Follow-up Appointment: Yes Medication Reconciliation completed and No provided to Patient/Care Anah Billard: Provided on Clinical Summary of Care: 12/28/2016 Form Type Recipient Paper Patient RD Electronic Signature(s) Signed: 12/28/2016 8:53:43 AM By: Alric Quan Entered By: Alric Quan on 12/28/2016 08:53:43 Anand, Cherryl R. ([id number]) -------------------------------------------------------------------------------- Lower Extremity Assessment Details Patient Name: Fournet, Monaca R. Date of Service: 12/28/2016 8:00 AM Medical Record Number: [id number] Patient Account Number: [id number] Date of Birth/Sex: 13-Nov-1960 (56 y.o. Female) Treating RN: Carolyne Fiscal, Debi Primary Care Ladonte Verstraete: Tracie Harrier Other Clinician: Referring Kentavious Michele: Tracie Harrier Treating Kyser Wandel/Extender: Frann Rider in Treatment: 5 Vascular Assessment Pulses: Dorsalis Pedis Palpable: [Right:Yes] Posterior Tibial Extremity colors, hair growth, and conditions: Extremity Color: [Right:Mottled] Temperature of Extremity: [Right:Warm] Capillary Refill: [Right:< 3 seconds] Toe Nail Assessment Left: Right: Thick: No Discolored: No Deformed: No Improper Length and Hygiene: No Electronic Signature(s) Signed: 12/28/2016 4:48:36 PM By: Alric Quan Entered By: Alric Quan on 12/28/2016 08:24:39 Nedeau, Ariyanna R. ([id number]) -------------------------------------------------------------------------------- Multi Wound Chart Details Patient Name: Friedt, Marelin R. Date of Service: 12/28/2016 8:00 AM Medical Record Number: [id number] Patient Account Number: [id number] Date of Birth/Sex: [birth year] (56 y.o. Female) Treating RN: Carolyne Fiscal, Debi Primary Care Heidi Lemay: Tracie Harrier Other Clinician: Referring Vanetta Rule: Tracie Harrier Treating Starkisha Tullis/Extender: Frann Rider in  Treatment: 5 Vital Signs Height(in): 65 Pulse(bpm): 60 Weight(lbs): 294.4 Blood Pressure(mmHg): 133/64 Body Mass Index(BMI): 49 Temperature(F): 97.7 Respiratory Rate 20 (breaths/min): Photos: [1:No Photos] [2:No Photos] [N/A:N/A] Wound Location: [1:Left Lower Leg - Lateral] [2:Left Lower Leg - Medial] [N/A:N/A] Wounding Event: [1:Trauma] [2:Trauma] [N/A:N/A] Primary Etiology: [1:Arterial Insufficiency Ulcer] [2:Trauma, Other] [N/A:N/A] Secondary Etiology: [1:Trauma, Other] [2:N/A] [N/A:N/A] Comorbid History: [1:Asthma, Sleep Apnea, Hypertension, Osteoarthritis] [2:Asthma, Sleep Apnea, Hypertension, Osteoarthritis] [N/A:N/A] Date Acquired: [1:10/26/2016] [2:12/16/2016] [N/A:N/A] Weeks of Treatment: [1:5] [2:1] [N/A:N/A] Wound Status: [1:Open] [2:Open] [N/A:N/A] Measurements L x W x D [1:1x1.2x0.2] [2:0.4x0.6x0.2] [N/A:N/A] (cm) Area (cm) : [1:0.942] [2:0.188] [N/A:N/A] Volume (cm) : [1:0.188] [2:0.038] [N/A:N/A] % Reduction in Area: [1:87.20%] [2:51.20%] [N/A:N/A] % Reduction in Volume: [1:74.50%] [2:0.00%] [N/A:N/A]  Classification: [1:Partial Thickness] [2:Partial Thickness] [N/A:N/A] Exudate Amount: [1:Large] [2:Large] [N/A:N/A] Exudate Type: [1:Serosanguineous] [2:Serous] [N/A:N/A] Exudate Color: [1:red, brown] [2:amber] [N/A:N/A] Wound Margin: [1:Distinct, outline attached] [2:Flat and Intact] [N/A:N/A] Granulation Amount: [1:None Present (0%)] [2:None Present (0%)] [N/A:N/A] Necrotic Amount: [1:Large (67-100%)] [2:Large (67-100%)] [N/A:N/A] Necrotic Tissue: [1:Adherent Slough] [2:Eschar] [N/A:N/A] Epithelialization: [1:None] [2:None] [N/A:N/A] Debridement: [1:Debridement ([id number])] [2:Debridement ([id number])] [N/A:N/A] Pre-procedure [1:08:30] [2:08:30] [N/A:N/A] Verification/Time Out Taken: Pain Control: [1:Lidocaine 4% Topical Solution] [2:Lidocaine 4% Topical Solution] [N/A:N/A] Tissue Debrided: [1:Fibrin/Slough, Exudates, Subcutaneous] [2:Fibrin/Slough,  Exudates, Subcutaneous] [N/A:N/A] Level: [1:Skin/Subcutaneous Tissue] [2:Skin/Subcutaneous Tissue] [N/A:N/A] Debridement Area (sq cm): [1:1.2] [2:0.24] [N/A:N/A] Instrument: [1:Curette] [2:Curette] [N/A:N/A] Bleeding: [1:Minimum] [2:Minimum] [N/A:N/A] Hemostasis Achieved: [1:Pressure] [2:Pressure] [N/A:N/A] Procedural Pain: 0 0 N/A Post Procedural Pain: 0 0 N/A Debridement Treatment Procedure was tolerated well Procedure was tolerated well N/A Response: Post Debridement 1.2x1.2x0.2 0.4x0.6x0.3 N/A Measurements L x W x D (cm) Post Debridement Volume: 0.226 0.057 N/A (cm) Periwound Skin Texture: Scarring: Yes No Abnormalities Noted N/A Periwound Skin Moisture: Maceration: No Maceration: Yes N/A Periwound Skin Color: Erythema: Yes Erythema: Yes N/A Erythema Location: Circumferential Circumferential N/A Temperature: No Abnormality No Abnormality N/A Tenderness on Palpation: Yes No N/A Wound Preparation: Ulcer Cleansing: Ulcer Cleansing: N/A Rinsed/Irrigated with Saline Rinsed/Irrigated with Saline Topical Anesthetic Applied: Topical Anesthetic Applied: Other: lidocaine 4% Other: lidocaine 4% Procedures Performed: Debridement Debridement N/A Treatment Notes Electronic Signature(s) Signed: 12/28/2016 8:38:40 AM By: Christin Fudge MD, FACS Entered By: Christin Fudge on 12/28/2016 08:38:40 Pickert, Ardelia R. ([id number]) -------------------------------------------------------------------------------- Paint Details Patient Name: Gwilliam, Micheline R. Date of Service: 12/28/2016 8:00 AM Medical Record Number: [id number] Patient Account Number: [id number] Date of Birth/Sex: 1960/11/20 (56 y.o. Female) Treating RN: Carolyne Fiscal, Debi Primary Care Dailon Sheeran: Tracie Harrier Other Clinician: Referring Sofia Vanmeter: Tracie Harrier Treating Harding Thomure/Extender: Frann Rider in Treatment: 5 Active Inactive ` Abuse / Safety / Falls / Self Care Management Nursing  Diagnoses: History of Falls Potential for falls Goals: Patient will not experience any injury related to falls Date Initiated: 11/23/2016 Target Resolution Date: 03/24/2017 Goal Status: Active Interventions: Assess Activities of Daily Living upon admission and as needed Assess fall risk on admission and as needed Assess: immobility, friction, shearing, incontinence upon admission and as needed Notes: ` Nutrition Nursing Diagnoses: Imbalanced nutrition Potential for alteratiion in Nutrition/Potential for imbalanced nutrition Goals: Patient/caregiver agrees to and verbalizes understanding of need to use nutritional supplements and/or vitamins as prescribed Date Initiated: 11/23/2016 Target Resolution Date: 03/24/2017 Goal Status: Active Interventions: Assess patient nutrition upon admission and as needed per policy Notes: ` Orientation to the Wound Care Program Nursing Diagnoses: Knowledge deficit related to the wound healing center program Goals: Patient/caregiver will verbalize understanding of the [place], [place]. ([id number]) Date Initiated: 11/23/2016 Target Resolution Date: 12/23/2016 Goal Status: Active Interventions: Provide education on orientation to the wound center Notes: ` Pain, Acute or Chronic Nursing Diagnoses: Pain, acute or chronic: actual or potential Potential alteration in comfort, pain Goals: Patient/caregiver will verbalize adequate pain control between visits Date Initiated: 11/23/2016 Target Resolution Date: 02/24/2017 Goal Status: Active Interventions: Complete pain assessment as per visit requirements Notes: ` Wound/Skin Impairment Nursing Diagnoses: Impaired tissue integrity Knowledge deficit related to smoking impact on wound healing Knowledge deficit related to ulceration/compromised skin integrity Goals: Ulcer/skin breakdown will have a volume reduction of 80% by week 12 Date Initiated: 11/23/2016 Target  Resolution Date: 03/24/2017 Goal Status: Active Interventions: Assess patient/caregiver ability to perform ulcer/skin care regimen upon admission and as needed Assess ulceration(s) every visit Provide  education on smoking Notes: Electronic Signature(s) Signed: 12/28/2016 4:48:36 PM By: Alric Quan Entered By: Alric Quan on 12/28/2016 08:29:54 Speedy, Fiza R. ([id number]) -------------------------------------------------------------------------------- Pain Assessment Details Patient Name: Kruger, Shantera R. Date of Service: 12/28/2016 8:00 AM Medical Record Number: [id number] Patient Account Number: [id number] Date of Birth/Sex: May 27, 1960 (56 y.o. Female) Treating RN: Carolyne Fiscal, Debi Primary Care Diavian Furgason: Tracie Harrier Other Clinician: Referring Valdez Brannan: Tracie Harrier Treating Adison Reifsteck/Extender: Frann Rider in Treatment: 5 Active Problems Location of Pain Severity and Description of Pain Patient Has Paino No Site Locations Pain Management and Medication Current Pain Management: Electronic Signature(s) Signed: 12/28/2016 4:48:36 PM By: Alric Quan Entered By: Alric Quan on 12/28/2016 08:14:36 Mcraney, Zykera R. ([id number]) -------------------------------------------------------------------------------- Patient/Caregiver Education Details Patient Name: Blume, Brookelle R. Date of Service: 12/28/2016 8:00 AM Medical Record Number: [id number] Patient Account Number: [id number] Date of Birth/Gender: [birth year] (56 y.o. Female) Treating RN: Carolyne Fiscal, Debi Primary Care Physician: Tracie Harrier Other Clinician: Referring Physician: Tracie Harrier Treating Physician/Extender: Frann Rider in Treatment: 5 Education Assessment Education Provided To: Patient Education Topics Provided Wound/Skin Impairment: Handouts: Caring for Your Ulcer, Other: change dressing as ordered Methods: Demonstration, Explain/Verbal Responses: State content  correctly Electronic Signature(s) Signed: 12/28/2016 4:48:36 PM By: Alric Quan Entered By: Alric Quan on 12/28/2016 08:54:01 Brandenberger, Aniaya R. ([id number]) -------------------------------------------------------------------------------- Wound Assessment Details Patient Name: Vanmetre, Saadiya R. Date of Service: 12/28/2016 8:00 AM Medical Record Number: [id number] Patient Account Number: [id number] Date of Birth/Sex: [birth year] (56 y.o. Female) Treating RN: Carolyne Fiscal, Debi Primary Care Gilberta Peeters: Tracie Harrier Other Clinician: Referring Malya Cirillo: Tracie Harrier Treating Krissa Utke/Extender: Frann Rider in Treatment: 5 Wound Status Wound Number: 1 Primary Etiology: Arterial Insufficiency Ulcer Wound Location: Left Lower Leg - Lateral Secondary Trauma, Other Etiology: Wounding Event: Trauma Wound Status: Open Date Acquired: 10/26/2016 Comorbid History: Asthma, Sleep Apnea, Hypertension, Weeks Of Treatment: 5 Osteoarthritis Clustered Wound: No Photos Photo Uploaded By: Alric Quan on 12/28/2016 16:50:52 Wound Measurements Length: (cm) 1 Width: (cm) 1.2 Depth: (cm) 0.2 Area: (cm) 0.942 Volume: (cm) 0.188 % Reduction in Area: 87.2% % Reduction in Volume: 74.5% Epithelialization: None Tunneling: No Undermining: No Wound Description Classification: Partial Thickness Wound Margin: Distinct, outline attached Exudate Amount: Large Exudate Type: Serosanguineous Exudate Color: red, brown Foul Odor After Cleansing: No Slough/Fibrino Yes Wound Bed Granulation Amount: None Present (0%) Necrotic Amount: Large (67-100%) Necrotic Quality: Adherent Slough Periwound Skin Texture Texture Color No Abnormalities Noted: No No Abnormalities Noted: No Scarring: Yes Erythema: Yes Erythema Location: Circumferential Moisture Clemence, Senie R. ([id number]) No Abnormalities Noted: No Temperature / Pain Maceration: No Temperature: No Abnormality Tenderness on  Palpation: Yes Wound Preparation Ulcer Cleansing: Rinsed/Irrigated with Saline Topical Anesthetic Applied: Other: lidocaine 4%, Treatment Notes Wound #1 (Left, Lateral Lower Leg) 1. Cleansed with: Clean wound with Normal Saline 2. Anesthetic Topical Lidocaine 4% cream to wound bed prior to debridement 4. Dressing Applied: Prisma Ag 5. Secondary Pen Mar Signature(s) Signed: 12/28/2016 4:48:36 PM By: Alric Quan Entered By: Alric Quan on 12/28/2016 08:22:44 Ronda, Dylana R. ([id number]) -------------------------------------------------------------------------------- Wound Assessment Details Patient Name: Forstner, Gargi R. Date of Service: 12/28/2016 8:00 AM Medical Record Number: [id number] Patient Account Number: [id number] Date of Birth/Sex: 1960/03/27 (56 y.o. Female) Treating RN: Carolyne Fiscal, Debi Primary Care Burns Timson: Tracie Harrier Other Clinician: Referring Marcellino Fidalgo: Tracie Harrier Treating Kennley Schwandt/Extender: Frann Rider in Treatment: 5 Wound Status Wound Number: 2 Primary Trauma, Other Etiology: Wound Location: Left Lower Leg - Medial Wound Status: Open Wounding Event: Trauma Comorbid Asthma, Sleep Apnea, Hypertension, Date Acquired: 12/16/2016  History: Osteoarthritis Weeks Of Treatment: 1 Clustered Wound: No Photos Photo Uploaded By: Alric Quan on 12/28/2016 16:51:14 Wound Measurements Length: (cm) 0.4 Width: (cm) 0.6 Depth: (cm) 0.2 Area: (cm) 0.188 Volume: (cm) 0.038 % Reduction in Area: 51.2% % Reduction in Volume: 0% Epithelialization: None Tunneling: No Undermining: No Wound Description Classification: Partial Thickness Wound Margin: Flat and Intact Exudate Amount: Large Exudate Type: Serous Exudate Color: amber Foul Odor After Cleansing: No Slough/Fibrino Yes Wound Bed Granulation Amount: None Present (0%) Exposed Structure Necrotic Amount: Large (67-100%) Fascia Exposed:  No Necrotic Quality: Eschar Fat Layer (Subcutaneous Tissue) Exposed: No Tendon Exposed: No Muscle Exposed: No Joint Exposed: No Bone Exposed: No Periwound Skin Texture Friedhoff, Jalin R. ([id number]) Texture Color No Abnormalities Noted: No No Abnormalities Noted: No Erythema: Yes Moisture Erythema Location: Circumferential No Abnormalities Noted: No Maceration: Yes Temperature / Pain Temperature: No Abnormality Wound Preparation Ulcer Cleansing: Rinsed/Irrigated with Saline Topical Anesthetic Applied: Other: lidocaine 4%, Treatment Notes Wound #2 (Left, Medial Lower Leg) 1. Cleansed with: Clean wound with Normal Saline 2. Anesthetic Topical Lidocaine 4% cream to wound bed prior to debridement 4. Dressing Applied: Santyl Ointment 5. Secondary Dressing Applied Bordered Foam Dressing Dry Gauze Electronic Signature(s) Signed: 12/28/2016 4:48:36 PM By: Alric Quan Entered By: Alric Quan on 12/28/2016 08:21:46 Campoy, Jeylin R. ([id number]) -------------------------------------------------------------------------------- Vitals Details Patient Name: Morrish, Aviance R. Date of Service: 12/28/2016 8:00 AM Medical Record Number: [id number] Patient Account Number: [id number] Date of Birth/Sex: 20-Jan-1960 (56 y.o. Female) Treating RN: Carolyne Fiscal, Debi Primary Care Lashante Fryberger: Tracie Harrier Other Clinician: Referring Cristol Engdahl: Tracie Harrier Treating Ulys Favia/Extender: Frann Rider in Treatment: 5 Vital Signs Time Taken: 08:08 Temperature (F): 97.7 Height (in): 65 Pulse (bpm): 60 Weight (lbs): 294.4 Respiratory Rate (breaths/min): 20 Body Mass Index (BMI): 49 Blood Pressure (mmHg): 133/64 Reference Range: 80 - 120 mg / dl Electronic Signature(s) Signed: 12/28/2016 4:48:36 PM By: Alric Quan Entered By: Alric Quan on 12/28/2016 08:17:41

## 2016-12-30 NOTE — Progress Notes (Signed)
DELAYLA, HOFFMASTER ([id number]) Visit Report for 12/28/2016 Chief Complaint Document Details Patient Name: [name], [name] Date of Service: 12/28/2016 8:00 AM Medical Record Number: [id number] Patient Account Number: [id number] Date of Birth/Sex: Apr 12, 1960 (56 y.o. Female) Treating RN: Ahmed Prima Primary Care Provider: Tracie Harrier Other Clinician: Referring Provider: Tracie Harrier Treating Provider/Extender: Frann Rider in Treatment: 5 Information Obtained from: Patient Chief Complaint Patient presents to the wound care center with open non-healing surgical wound(s) to the left lower leg which she's had for about a month Electronic Signature(s) Signed: 12/28/2016 8:39:02 AM By: Christin Fudge MD, FACS Entered By: Christin Fudge on 12/28/2016 08:39:02 Pelland, Irasema R. ([id number]) -------------------------------------------------------------------------------- Debridement Details Patient Name: [name], [name] Date of Service: 12/28/2016 8:00 AM Medical Record Number: [id number] Patient Account Number: [id number] Date of Birth/Sex: 02-17-1960 (56 y.o. Female) Treating RN: Carolyne Fiscal, Debi Primary Care Provider: Tracie Harrier Other Clinician: Referring Provider: Tracie Harrier Treating Provider/Extender: Frann Rider in Treatment: 5 Debridement Performed for Wound #1 Left,Lateral Lower Leg Assessment: Performed By: Physician Christin Fudge, MD Debridement: Debridement Severity of Tissue Pre Fat layer exposed Debridement: Pre-procedure Verification/Time Yes - 08:30 Out Taken: Start Time: 08:31 Pain Control: Lidocaine 4% Topical Solution Level: Skin/Subcutaneous Tissue Total Area Debrided (L x W): 1 (cm) x 1.2 (cm) = 1.2 (cm) Tissue and other material Viable, Non-Viable, Exudate, Fibrin/Slough, Subcutaneous debrided: Instrument: Curette Bleeding: Minimum Hemostasis Achieved: Pressure End Time: 08:33 Procedural Pain: 0 Post Procedural  Pain: 0 Response to Treatment: Procedure was tolerated well Post Debridement Measurements of Total Wound Length: (cm) 1.2 Width: (cm) 1.2 Depth: (cm) 0.2 Volume: (cm) 0.226 Character of Wound/Ulcer Post Debridement: Requires Further Debridement Severity of Tissue Post Debridement: Fat layer exposed Post Procedure Diagnosis Same as Pre-procedure Electronic Signature(s) Signed: 12/28/2016 8:38:48 AM By: Christin Fudge MD, FACS Signed: 12/28/2016 4:48:36 PM By: Alric Quan Entered By: Christin Fudge on 12/28/2016 08:38:48 Reppucci, Mattalyn R. ([id number]) -------------------------------------------------------------------------------- Debridement Details Patient Name: [name], [name] Date of Service: 12/28/2016 8:00 AM Medical Record Number: [id number] Patient Account Number: [id number] Date of Birth/Sex: 30-Jul-1960 (56 y.o. Female) Treating RN: Carolyne Fiscal, Debi Primary Care Provider: Tracie Harrier Other Clinician: Referring Provider: Tracie Harrier Treating Provider/Extender: Frann Rider in Treatment: 5 Debridement Performed for Wound #2 Left,Medial Lower Leg Assessment: Performed By: Physician Christin Fudge, MD Debridement: Debridement Pre-procedure Verification/Time Yes - 08:30 Out Taken: Start Time: 08:33 Pain Control: Lidocaine 4% Topical Solution Level: Skin/Subcutaneous Tissue Total Area Debrided (L x W): 0.4 (cm) x 0.6 (cm) = 0.24 (cm) Tissue and other material Viable, Non-Viable, Exudate, Fibrin/Slough, Subcutaneous debrided: Instrument: Curette Bleeding: Minimum Hemostasis Achieved: Pressure End Time: 08:35 Procedural Pain: 0 Post Procedural Pain: 0 Response to Treatment: Procedure was tolerated well Post Debridement Measurements of Total Wound Length: (cm) 0.4 Width: (cm) 0.6 Depth: (cm) 0.3 Volume: (cm) 0.057 Character of Wound/Ulcer Post Debridement: Requires Further Debridement Post Procedure Diagnosis Same as  Pre-procedure Electronic Signature(s) Signed: 12/28/2016 8:38:55 AM By: Christin Fudge MD, FACS Signed: 12/28/2016 4:48:36 PM By: Alric Quan Entered By: Christin Fudge on 12/28/2016 08:38:55 Wassenaar, Tiari R. ([id number]) -------------------------------------------------------------------------------- HPI Details Patient Name: [name], [name] Date of Service: 12/28/2016 8:00 AM Medical Record Number: [id number] Patient Account Number: [id number] Date of Birth/Sex: 11-09-1960 (56 y.o. Female) Treating RN: Carolyne Fiscal, Debi Primary Care Provider: Tracie Harrier Other Clinician: Referring Provider: Tracie Harrier Treating Provider/Extender: Frann Rider in Treatment: 5 History of Present Illness Location: Patient presents with a wound to left lower leg. Quality: Patient reports experiencing a sharp pain to affected  area(s). Severity: Patient states wound are getting worse. Duration: Patient has had the wound for < 4 weeks prior to presenting for treatment Timing: Pain in wound is Intermittent (comes and goes Context: The wound occurred when the patient had a laceration with a sharp object Modifying Factors: Other treatment(s) tried include:suturing done in the ER and sutures removed on 2 weeks Associated Signs and Symptoms: Patient reports having increase discharge. HPI Description: 56 year old patient seen by her PCP Dr. Ginette Pitman, for a follow-up of her left leg lacerated wound had sutures removed a week before she was seen on 11/15/2016. there was swelling and erythema and the edges of the laceration had not completely healed and she had completed a course of doxycycline. She had quit smoking about a year ago. Past medical history significant for anxiety states, asthma, endometriosis, GERD, obesity, sleep apnea, status post appendectomy, cardiac catheterization, cholecystectomy, multiple colonoscopies and EGDs, hysterectomy, knee arthroscopies. she recently restarted smoking  after quitting for over 20 years 12/21/2016 -- she now presents with a new wound to the left lower third of her leg where her dog bit her and caused a puncture wound which is now been draining and has some necrotic debris. She was seen by her PCP and he has put her on doxycycline orally. She has given up smoking this last week. 12/28/2016 -- she is using her Santyl ointment for the new wound on the lower leg and has been draining significantly. Electronic Signature(s) Signed: 12/28/2016 8:39:47 AM By: Christin Fudge MD, FACS Entered By: Christin Fudge on 12/28/2016 08:39:47 Englert, Xoie R. ([id number]) -------------------------------------------------------------------------------- Physical Exam Details Patient Name: Bardales, Meridian R. Date of Service: 12/28/2016 8:00 AM Medical Record Number: [id number] Patient Account Number: [id number] Date of Birth/Sex: 02/19/1960 (56 y.o. Female) Treating RN: Ahmed Prima Primary Care Provider: Tracie Harrier Other Clinician: Referring Provider: Tracie Harrier Treating Provider/Extender: Frann Rider in Treatment: 5 Constitutional . Pulse regular. Respirations normal and unlabored. Afebrile. . Eyes Nonicteric. Reactive to light. Ears, Nose, Mouth, and Throat Lips, teeth, and gums WNL.Marland Kitchen Moist mucosa without lesions. Neck supple and nontender. No palpable supraclavicular or cervical adenopathy. Normal sized without goiter. Respiratory WNL. No retractions.. Cardiovascular Pedal Pulses WNL. No clubbing, cyanosis or edema. Lymphatic No adneopathy. No adenopathy. No adenopathy. Musculoskeletal Adexa without tenderness or enlargement.. Digits and nails w/o clubbing, cyanosis, infection, petechiae, ischemia, or inflammatory conditions.. Integumentary (Hair, Skin) No suspicious lesions. No crepitus or fluctuance. No peri-wound warmth or erythema. No masses.Marland Kitchen Psychiatric Judgement and insight Intact.. No evidence of depression, anxiety,  or agitation.. Notes the original wound on the left upper calf near the knee has minimal eschar and subcutaneous debris and I sharply removed this with a #3 curet. The lower leg puncture wound on the left has significant debris and this was sharply removed with a #3 curet and minimal bleeding controlled with pressure Electronic Signature(s) Signed: 12/28/2016 8:40:32 AM By: Christin Fudge MD, FACS Entered By: Christin Fudge on 12/28/2016 08:40:32 Guisinger, Shauntell R. ([id number]) -------------------------------------------------------------------------------- Physician Orders Details Patient Name: Cleland, Andilynn R. Date of Service: 12/28/2016 8:00 AM Medical Record Number: [id number] Patient Account Number: [id number] Date of Birth/Sex: Sep 06, 1960 (56 y.o. Female) Treating RN: Carolyne Fiscal, Debi Primary Care Provider: Tracie Harrier Other Clinician: Referring Provider: Tracie Harrier Treating Provider/Extender: Frann Rider in Treatment: 5 Verbal / Phone Orders: Yes Clinician: Pinkerton, Debi Read Back and Verified: Yes Diagnosis Coding Wound Cleansing Wound #1 Left,Lateral Lower Leg o Clean wound with Normal Saline. o Cleanse wound with mild soap and water o  May Shower, gently pat wound dry prior to applying new dressing. o May shower with protection. Wound #2 Left,Medial Lower Leg o Clean wound with Normal Saline. o Cleanse wound with mild soap and water o May Shower, gently pat wound dry prior to applying new dressing. o May shower with protection. Anesthetic (add to Medication List) Wound #1 Left,Lateral Lower Leg o Topical Lidocaine 4% cream applied to wound bed prior to debridement (In Clinic Only). Wound #2 Left,Medial Lower Leg o Topical Lidocaine 4% cream applied to wound bed prior to debridement (In Clinic Only). Skin Barriers/Peri-Wound Care Wound #1 Left,Lateral Lower Leg o Skin Prep Wound #2 Left,Medial Lower Leg o Skin Prep Primary  Wound Dressing Wound #1 Left,Lateral Lower Leg o Prisma Ag Wound #2 Left,Medial Lower Leg o Santyl Ointment Secondary Dressing Wound #1 Left,Lateral Lower Leg o Dry Gauze o Other - telfa island Wound #2 Left,Medial Lower Leg o Dry Gauze o Other - telfa island Langhorst, Cheyan R. ([id number]) Dressing Change Frequency Wound #1 Left,Lateral Lower Leg o Change dressing every other day. Wound #2 Left,Medial Lower Leg o Change dressing every day. Edema Control Wound #1 Left,Lateral Lower Leg o Elevate legs to the level of the heart and pump ankles as often as possible Wound #2 Left,Medial Lower Leg o Elevate legs to the level of the heart and pump ankles as often as possible Additional Orders / Instructions Wound #1 Left,Lateral Lower Leg o Stop Smoking - continue to o Increase protein intake. Wound #2 Left,Medial Lower Leg o Stop Smoking - continue to o Increase protein intake. Medications-please add to medication list. Wound #1 Left,Lateral Lower Leg o Other: - Vitamin C, Vitamin A, Zinc Wound #2 Left,Medial Lower Leg o Santyl Enzymatic Ointment o Other: - Vitamin C, Vitamin A, Zinc Electronic Signature(s) Signed: 12/28/2016 3:15:43 PM By: Christin Fudge MD, FACS Signed: 12/28/2016 4:48:36 PM By: Alric Quan Entered By: Alric Quan on 12/28/2016 08:35:48 Ruhland, Ayrabella R. ([id number]) -------------------------------------------------------------------------------- Problem List Details Patient Name: Kazee, Charmion R. Date of Service: 12/28/2016 8:00 AM Medical Record Number: [id number] Patient Account Number: [id number] Date of Birth/Sex: 12/29/1960 (56 y.o. 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 ([id number]) -------------------------------------------------------------------------------- Progress Note Details Patient Name: Venier, Jenavie R. Date of Service: 12/28/2016 8:00 AM Medical Record Number: [id number] Patient Account Number: [id number] Date of Birth/Sex: [birth year] (56 y.o. Female) Treating RN: Ahmed Prima Primary Care Provider: Tracie Harrier Other Clinician: Referring Provider: Tracie Harrier Treating Provider/Extender: Frann Rider in Treatment: 5 Subjective Chief Complaint Information obtained from Patient Patient presents to the wound care center with open non-healing surgical wound(s) to the left lower leg which she's had for about a month History of Present Illness (HPI) The following HPI elements were documented for the patient's wound: Location: Patient presents with a wound to left lower leg.  Quality: Patient reports experiencing a sharp pain to affected area(s). Severity: Patient states wound are getting worse. Duration: Patient has had the wound for < 4 weeks prior to presenting for treatment Timing: Pain in wound is Intermittent (comes and goes Context: The wound occurred when the patient had a laceration with a sharp object Modifying Factors: Other treatment(s) tried include:suturing  done in the ER and sutures removed on 2 weeks Associated Signs and Symptoms: Patient reports having increase discharge. 56 year old patient seen by her PCP Dr. Ginette Pitman, for a follow-up of her left leg lacerated wound had sutures removed a week before she was seen on 11/15/2016. there was swelling and erythema and the edges of the laceration had not completely healed and she had completed a course of doxycycline. She had quit smoking about a year ago. Past medical history significant for anxiety states, asthma, endometriosis, GERD, obesity, sleep apnea, status post appendectomy, cardiac catheterization, cholecystectomy, multiple colonoscopies and EGDs, hysterectomy, knee arthroscopies. she recently restarted smoking after quitting for over 20 years 12/21/2016 -- she now presents with a new wound to the left lower third of her leg where her dog bit her and caused a puncture wound which is now been draining and has some necrotic debris. She was seen by her PCP and he has put her on doxycycline orally. She has given up smoking this last week. 12/28/2016 -- she is using her Santyl ointment for the new wound on the lower leg and has been draining significantly. Patient History Information obtained from Patient. Family History Cancer - Father, Diabetes - Mother,Siblings, Heart Disease - Mother,Siblings, Hypertension - Mother,Father,Siblings, Lung Disease - Mother,Father,Siblings, Stroke - Mother,Father, Thyroid Problems - Father,Siblings, No family history of Hereditary Spherocytosis, Kidney Disease, Seizures, Tuberculosis. Social History Current every day smoker - 3 cigarettes a day, Marital Status - Married, Alcohol Use - Never, Drug Use - No History, Caffeine Use - Daily. Gracia, Rasheka R. ([id number]) Objective Constitutional Pulse regular. Respirations normal and unlabored. Afebrile. Vitals Time Taken: 8:08 AM, Height: 65 in, Weight: 294.4 lbs, BMI: 49, Temperature: 97.7 F, Pulse: 60 bpm,  Respiratory Rate: 20 breaths/min, Blood Pressure: 133/64 mmHg. Eyes Nonicteric. Reactive to light. Ears, Nose, Mouth, and Throat Lips, teeth, and gums WNL.Marland Kitchen Moist mucosa without lesions. Neck supple and nontender. No palpable supraclavicular or cervical adenopathy. Normal sized without goiter. Respiratory WNL. No retractions.. Cardiovascular Pedal Pulses WNL. No clubbing, cyanosis or edema. Lymphatic No adneopathy. No adenopathy. No adenopathy. Musculoskeletal Adexa without tenderness or enlargement.. Digits and nails w/o clubbing, cyanosis, infection, petechiae, ischemia, or inflammatory conditions.Marland Kitchen Psychiatric Judgement and insight Intact.. No evidence of depression, anxiety, or agitation.. General Notes: the original wound on the left upper calf near the knee has minimal eschar and subcutaneous debris and I sharply removed this with a #3 curet. The lower leg puncture wound on the left has significant debris and this was sharply removed with a #3 curet and minimal bleeding controlled with pressure Integumentary (Hair, Skin) No suspicious lesions. No crepitus or fluctuance. No peri-wound warmth or erythema. No masses.. Wound #1 status is Open. Original cause of wound was Trauma. The wound is located on the Left,Lateral Lower Leg. The wound measures 1cm length x 1.2cm width x 0.2cm depth; 0.942cm^2 area and 0.188cm^3 volume. There is no tunneling or undermining noted. There is a large amount of serosanguineous drainage noted. The wound margin is distinct with the outline attached to the wound base. There is no granulation within the wound bed. There is a large (67-100%) amount  of necrotic tissue within the wound bed including Adherent Slough. The periwound skin appearance exhibited: Scarring, Erythema. The periwound skin appearance did not exhibit: Maceration. The surrounding wound skin color is noted with erythema which is circumferential. Periwound temperature was noted as No  Abnormality. The periwound has tenderness on palpation. Wound #2 status is Open. Original cause of wound was Trauma. The wound is located on the Left,Medial Lower Leg. The wound measures 0.4cm length x 0.6cm width x 0.2cm depth; 0.188cm^2 area and 0.038cm^3 volume. There is no tunneling Schaper, Karah R. ([id number]) or undermining noted. There is a large amount of serous drainage noted. The wound margin is flat and intact. There is no granulation within the wound bed. There is a large (67-100%) amount of necrotic tissue within the wound bed including Eschar. The periwound skin appearance exhibited: Maceration, Erythema. The surrounding wound skin color is noted with erythema which is circumferential. Periwound temperature was noted as No Abnormality. 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 There was a Skin/Subcutaneous Tissue Debridement ([id number]) debridement with total area of 1.2 sq cm performed by Christin Fudge, MD. with the following instrument(s): Curette to remove Viable and Non-Viable tissue/material including Exudate, Fibrin/Slough, and Subcutaneous after achieving pain control using Lidocaine 4% Topical Solution. A time out was conducted at 08:30, prior to the start of the procedure. A Minimum amount of bleeding was controlled with Pressure. The procedure was tolerated well with a pain level  of 0 throughout and a pain level of 0 following the procedure. Post Debridement Measurements: 1.2cm length x 1.2cm width x 0.2cm depth; 0.226cm^3 volume. Character of Wound/Ulcer Post Debridement requires further debridement. Severity of Tissue Post Debridement is: Fat layer exposed. Post procedure Diagnosis Wound #1: Same as Pre-Procedure Wound #2 Pre-procedure diagnosis of Wound #2 is a Trauma, Other located on the Left,Medial Lower Leg . There was a Skin/Subcutaneous Tissue Debridement ([id number]) debridement with total area of 0.24 sq cm performed by Christin Fudge, MD. with the following instrument(s): Curette to remove Viable and Non-Viable tissue/material including Exudate, Fibrin/Slough, and Subcutaneous after achieving pain control using Lidocaine 4% Topical Solution. A time out was conducted at 08:30, prior to the start of the procedure. A Minimum amount of bleeding was controlled with Pressure. The procedure was tolerated well with a pain level of 0 throughout and a pain level of 0 following the procedure. Post Debridement Measurements: 0.4cm length x 0.6cm width x 0.3cm depth; 0.057cm^3 volume. Character of Wound/Ulcer Post Debridement requires further debridement. Post procedure Diagnosis Wound #2: Same as Pre-Procedure Plan Ballon, Sharronda R. ([id number]) Wound Cleansing: Wound #1 Left,Lateral Lower Leg: Clean wound with Normal Saline. Cleanse wound with mild soap and water May Shower, gently pat wound dry prior to applying new dressing. May shower with protection. Wound #2 Left,Medial Lower Leg: Clean wound with Normal Saline. Cleanse wound with mild soap and water May Shower, gently pat wound dry prior to applying new dressing. May shower with protection. Anesthetic (add to Medication List): Wound #1 Left,Lateral  Lower Leg: Topical Lidocaine 4% cream applied to wound bed prior to debridement (In Clinic Only). Wound #2 Left,Medial Lower Leg: Topical Lidocaine 4% cream  applied to wound bed prior to debridement (In Clinic Only). Skin Barriers/Peri-Wound Care: Wound #1 Left,Lateral Lower Leg: Skin Prep Wound #2 Left,Medial Lower Leg: Skin Prep Primary Wound Dressing: Wound #1 Left,Lateral Lower Leg: Prisma Ag Wound #2 Left,Medial Lower Leg: Santyl Ointment Secondary Dressing: Wound #1 Left,Lateral Lower Leg: Dry Gauze Other - telfa island Wound #2 Left,Medial Lower Leg: Dry Gauze Other - telfa island Dressing Change Frequency: Wound #1 Left,Lateral Lower Leg: Change dressing every other day. Wound #2 Left,Medial Lower Leg: Change dressing every day. Edema Control: Wound #1 Left,Lateral Lower Leg: Elevate legs to the level of the heart and pump ankles as often as possible Wound #2 Left,Medial Lower Leg: Elevate legs to the level of the heart and pump ankles as often as possible Additional Orders / Instructions: Wound #1 Left,Lateral Lower Leg: Stop Smoking - continue to Increase protein intake. Wound #2 Left,Medial Lower Leg: Stop Smoking - continue to Increase protein intake. Medications-please add to medication list.: Wound #1 Left,Lateral Lower Leg: Other: - Vitamin C, Vitamin A, Zinc Wound #2 Left,Medial Lower Leg: Santyl Enzymatic Ointment Other: - Vitamin C, Vitamin A, Zinc Schumacher, Lannette R. ([id number]) she is doing well and is very compliant and after sharp debridement, I have recommended: 1. Silver collagen and a bordered foam to be changed daily, to the left upper wound 2. Santyl ointment to the new wound to be changed daily 3. Adequate protein, vitamin A, vitamin C and zinc 4. she has still completely given up smoking 5. Regular visits wound center She has had all questions answered and says she will be compliant Electronic Signature(s) Signed: 12/28/2016 8:42:08 AM By: Christin Fudge MD, FACS Entered By: Christin Fudge on 12/28/2016 08:42:08 Feuerstein, Dalyah R.  ([id number]) -------------------------------------------------------------------------------- ROS/PFSH Details Patient Name: Temme, Jazilyn R. Date of Service: 12/28/2016 8:00 AM Medical Record Number: [id number] Patient Account Number: [id number] Date of Birth/Sex: 1960/03/04 (56 y.o. Female) Treating RN: Carolyne Fiscal, Debi Primary Care Provider: Tracie Harrier Other Clinician: Referring Provider: Tracie Harrier Treating Provider/Extender: Frann Rider in Treatment: 5 Information Obtained From Patient Wound History Do you currently have one or more open woundso Yes How many open wounds do you currently haveo 1 Approximately how long have you had your woundso 4 weeks How have you been treating your wound(s) until nowo saline and neosporin Has your wound(s) ever healed and then re-openedo No Have you had any lab work done in the past montho No Have you tested positive for an antibiotic resistant organism (MRSA, VRE)o No Have you tested positive for osteomyelitis (bone infection)o No Have you had any tests for circulation on your legso Yes Who ordered the testo dr. dew Where was the test doneo avvs Have you had other problems associated with your woundso Swelling Respiratory Medical History: Positive for: Asthma; Sleep Apnea Cardiovascular Medical History: Positive for: Hypertension Musculoskeletal Medical History: Positive for: Osteoarthritis Immunizations Pneumococcal Vaccine: Received Pneumococcal Vaccination: Yes Implantable Devices Family and Social History Cancer: Yes - Father; Diabetes: Yes - Mother,Siblings; Heart Disease: Yes - Mother,Siblings; Hereditary Spherocytosis: No; Hypertension: Yes - Mother,Father,Siblings; Kidney Disease: No; Lung Disease: Yes - Mother,Father,Siblings; Seizures: No; Stroke: Yes - Mother,Father; Thyroid Problems: Yes - Father,Siblings; Tuberculosis: No; Current every day smoker - 3 cigarettes a day; Marital Status - Married; Alcohol  Use: Never; Drug Use: No History; Caffeine Use: Daily; Financial Concerns: No; Food, Clothing  or Shelter Needs: No; Support System Lacking: No; Transportation Concerns: No; Advanced Directives: No; Patient does not want information on Advanced Directives; Do not resuscitate: No; Living Will: No; Medical Power of Attorney: No Physician Affirmation I have reviewed and agree with the above information. QUINLYN, TEP ([id number]) Electronic Signature(s) Signed: 12/28/2016 3:15:43 PM By: Christin Fudge MD, FACS Signed: 12/28/2016 4:48:36 PM By: Alric Quan Entered By: Christin Fudge on 12/28/2016 08:39:54 Cannell, Vieno R. ([id number]) -------------------------------------------------------------------------------- SuperBill Details Patient Name: Hottinger, Adele R. Date of Service: 12/28/2016 Medical Record Number: [id number] Patient Account Number: [id number] Date of Birth/Sex: 10-Oct-1960 (56 y.o. Female) Treating RN: Carolyne Fiscal, Debi Primary Care Provider: Tracie Harrier Other Clinician: Referring Provider: Tracie Harrier Treating Provider/Extender: Frann Rider in Treatment: 5 Diagnosis Coding ICD-10 Codes Code Description [phone] Non-pressure 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 exposed S81.812A Laceration without foreign body, left lower leg, initial encounter E66.01 Morbid (severe) obesity due to excess calories F17.218 Nicotine dependence, cigarettes, with other nicotine-induced disorders S81.852A Open bite, left lower leg, initial encounter 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d Facility Procedures CPT4 Code: 83291916 Description: 60600 - DEB SUBQ TISSUE 20 SQ CM/< ICD-10 Diagnosis Description 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 S81.852A Open bite, left lower leg, initial encounter K59.977S Laceration without foreign body, left lower  leg, initial enco Modifier: d unter Quantity: 1 Physician  Procedures CPT4 Code: 1423953 Description: 20233 - WC PHYS SUBQ TISS 20 SQ CM ICD-10 Diagnosis Description 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

## 2016-12-31 ENCOUNTER — Other Ambulatory Visit: Payer: Self-pay | Admitting: Psychiatry

## 2017-01-04 ENCOUNTER — Encounter: Payer: Medicare Other | Admitting: Nurse Practitioner

## 2017-01-04 DIAGNOSIS — L97222 Non-pressure 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 exposed: Secondary | ICD-10-CM | POA: Diagnosis not present

## 2017-01-06 NOTE — Progress Notes (Signed)
Dawn, [name] ([id number]) Visit Report for 01/04/2017 Arrival Information Details Patient Name: [name], [name] Date of Service: 01/04/2017 8:00 AM Medical Record Number: [id number] Patient Account Number: [id number] Date of Birth/Sex: 11-Nov-1960 (56 y.o. Female) Treating RN: [name] Primary Care [name]: [name] Other Clinician: Referring [name]: [name] Treating [name]/Extender: [name] in Treatment: 6 Visit Information History Since Last Visit All ordered tests and consults were completed: No Patient Arrived: Ambulatory Added or deleted any medications: No Arrival Time: 08:03 Any new allergies or adverse reactions: No Accompanied By: self Had a fall or experienced change in No Transfer Assistance: None activities of daily living that may affect Patient Identification Verified: Yes risk of falls: Secondary Verification Process Completed: Yes Signs or symptoms of abuse/neglect since last visito No Patient Requires Transmission-Based No Hospitalized since last visit: No Precautions: Has Dressing in Place as Prescribed: Yes Patient Has Alerts: No Pain Present Now: No Electronic Signature(s) Signed: 01/04/2017 4:26:05 PM By: [name] Entered By: [name] on 01/04/2017 08:04:38 [name], [name] ([id number]) -------------------------------------------------------------------------------- Encounter Discharge Information Details Patient Name: [name], [name] Date of Service: 01/04/2017 8:00 AM Medical Record Number: [id number] Patient Account Number: [id number] Date of Birth/Sex: [birth year] (56 y.o. Female) Treating RN: [name] Primary Care [name]: [name] Other Clinician: Referring [name]: [name] Treating Blandon Offerdahl/Extender: [name] in Treatment: 6 Encounter Discharge Information Items Discharge Pain Level: 0 Discharge Condition: Stable Ambulatory Status:  Ambulatory Discharge Destination: Home Transportation: Private Auto Accompanied By: self Schedule Follow-up Appointment: Yes Medication Reconciliation completed and No provided to Patient/Care Ahsley Attwood: Patient Clinical Summary of Care: Declined Electronic Signature(s) Signed: 01/04/2017 8:47:22 AM By: [name] Entered By: [name] on 01/04/2017 08:47:22 [name], [name] ([id number]) -------------------------------------------------------------------------------- Lower Extremity Assessment Details Patient Name: [name], [name] Date of Service: 01/04/2017 8:00 AM Medical Record Number: [id number] Patient Account Number: [id number] Date of Birth/Sex: 1960-06-21 (56 y.o. Female) Treating RN: [name] Primary Care Versa Craton: [name] Other Clinician: Referring Deretha Ertle: [name] Treating Mihika Surrette/Extender: [name] in Treatment: 6 Vascular Assessment Pulses: Dorsalis Pedis Palpable: [Left:Yes] Posterior Tibial Extremity colors, hair growth, and conditions: Extremity Color: [Left:Mottled] Temperature of Extremity: [Left:Warm] Capillary Refill: [Left:< 3 seconds] Toe Nail Assessment Left: Right: Thick: No Discolored: No Deformed: No Improper Length and Hygiene: No Electronic Signature(s) Signed: 01/04/2017 4:26:05 PM By: [name] Entered By: [name] on 01/04/2017 08:11:15 [name], [name] ([id number]) -------------------------------------------------------------------------------- Multi Wound Chart Details Patient Name: [name], [name] Date of Service: 01/04/2017 8:00 AM Medical Record Number: [id number] Patient Account Number: [id number] Date of Birth/Sex: [birth year] (56 y.o. Female) Treating RN: [name] Primary Care [name]: [name] Other Clinician: Referring Albana Saperstein: [name] Treating [name]/Extender: [name] in Treatment: 6 Vital Signs Height(in):  65 Pulse(bpm): 39 Weight(lbs): 294.4 Blood Pressure(mmHg): 120/63 Body Mass Index(BMI): 49 Temperature(F): 97.8 Respiratory Rate 20 (breaths/min): Photos: [1:No Photos] [2:No Photos] [N/A:N/A] Wound Location: [1:Left Lower Leg - Lateral] [2:Left Lower Leg - Medial] [N/A:N/A] Wounding Event: [1:Trauma] [2:Trauma] [N/A:N/A] Primary Etiology: [1:Arterial Insufficiency Ulcer] [2:Trauma, Other] [N/A:N/A] Secondary Etiology: [1:Trauma, Other] [2:N/A] [N/A:N/A] Comorbid History: [1:Asthma, Sleep Apnea, Hypertension, Osteoarthritis] [2:Asthma, Sleep Apnea, Hypertension, Osteoarthritis] [N/A:N/A] Date Acquired: [1:10/26/2016] [2:12/16/2016] [N/A:N/A] Weeks of Treatment: [1:6] [2:2] [N/A:N/A] Wound Status: [1:Open] [2:Open] [N/A:N/A] Measurements L x W x D [1:0.8x1x0.2] [2:0.4x0.7x0.1] [N/A:N/A] (cm) Area (cm) : [1:0.628] [2:0.22] [N/A:N/A] Volume (cm) : [1:0.126] [2:0.022] [N/A:N/A] % Reduction in Area: [1:91.50%] [2:42.90%] [N/A:N/A] % Reduction in Volume: [1:82.90%] [2:42.10%] [N/A:N/A] Classification: [1:Partial Thickness] [2:Partial Thickness] [N/A:N/A] Exudate  Amount: [1:Large] [2:Large] [N/A:N/A] Exudate Type: [1:Serosanguineous] [2:Serosanguineous] [N/A:N/A] Exudate Color: [1:red, brown] [2:red, brown] [N/A:N/A] Wound Margin: [1:Distinct, outline attached] [2:Flat and Intact] [N/A:N/A] Granulation Amount: [1:None Present (0%)] [2:Medium (34-66%)] [N/A:N/A] Necrotic Amount: [1:Large (67-100%)] [2:Medium (34-66%)] [N/A:N/A] Epithelialization: [1:None] [2:None] [N/A:N/A] Debridement: [1:Debridement (60630-16010)] [2:Debridement (93235-57322)] [N/A:N/A] Pre-procedure [1:08:18] [2:08:18] [N/A:N/A] Verification/Time Out Taken: Pain Control: [1:Lidocaine 4% Topical Solution] [2:Lidocaine 4% Topical Solution] [N/A:N/A] Tissue Debrided: [1:Fibrin/Slough, Exudates, Subcutaneous] [2:Fibrin/Slough, Exudates, Subcutaneous] [N/A:N/A] Level: [1:Skin/Subcutaneous Tissue] [2:Skin/Subcutaneous  Tissue] [N/A:N/A] Debridement Area (sq cm): [1:0.8] [2:0.28] [N/A:N/A] Instrument: [1:Blade] [2:Blade] [N/A:N/A] Bleeding: [1:Minimum] [2:Minimum] [N/A:N/A] Hemostasis Achieved: [1:Pressure] [2:Pressure] [N/A:N/A] Procedural Pain: [1:0] [2:0] [N/A:N/A] Post Procedural Pain: 0 0 N/A Debridement Treatment Procedure was tolerated well Procedure was tolerated well N/A Response: Post Debridement 0.8x1x0.2 0.4x0.7x0.2 N/A Measurements L x W x D (cm) Post Debridement Volume: 0.126 0.044 N/A (cm) Periwound Skin Texture: Scarring: Yes No Abnormalities Noted N/A Periwound Skin Moisture: Maceration: No Maceration: Yes N/A Periwound Skin Color: Erythema: Yes Erythema: Yes N/A Erythema Location: Circumferential Circumferential N/A Temperature: No Abnormality No Abnormality N/A Tenderness on Palpation: Yes Yes N/A Wound Preparation: Ulcer Cleansing: Ulcer Cleansing: N/A Rinsed/Irrigated with Saline Rinsed/Irrigated with Saline Topical Anesthetic Applied: Topical Anesthetic Applied: Other: lidocaine 4% Other: lidocaine 4% Procedures Performed: Debridement Debridement N/A Treatment Notes Electronic Signature(s) Signed: 01/04/2017 4:55:16 PM By: Lawanda Cousins Entered By: Lawanda Cousins on 01/04/2017 08:29:36 [name], [name] ([id number]) -------------------------------------------------------------------------------- [place] Details Patient Name: Hockenbury, Zhania R. Date of Service: 01/04/2017 8:00 AM Medical Record Number: [id number] Patient Account Number: [id number] Date of Birth/Sex: 07-10-1960 (56 y.o. Female) Treating RN: [name] Primary Care Dianara Smullen: [name] Other Clinician: Referring Jiovany Scheffel: [name] Treating Philippa Vessey/Extender: [name] in Treatment: 6 Active Inactive ` Abuse / Safety / Falls / Self Care Management Nursing Diagnoses: History of Falls Potential for falls Goals: Patient will not experience any  injury related to falls Date Initiated: 11/23/2016 Target Resolution Date: 03/24/2017 Goal Status: Active Interventions: Assess Activities of Daily Living upon admission and as needed Assess fall risk on admission and as needed Assess: immobility, friction, shearing, incontinence upon admission and as needed Notes: ` Nutrition Nursing Diagnoses: Imbalanced nutrition Potential for alteratiion in Nutrition/Potential for imbalanced nutrition Goals: Patient/caregiver agrees to and verbalizes understanding of need to use nutritional supplements and/or vitamins as prescribed Date Initiated: 11/23/2016 Target Resolution Date: 03/24/2017 Goal Status: Active Interventions: Assess patient nutrition upon admission and as needed per policy Notes: ` Orientation to the Wound Care Program Nursing Diagnoses: Knowledge deficit related to the wound healing center program Goals: Patient/caregiver will verbalize understanding of the [place], [place]. ([id number]) Date Initiated: 11/23/2016 Target Resolution Date: 12/23/2016 Goal Status: Active Interventions: Provide education on orientation to the wound center Notes: ` Pain, Acute or Chronic Nursing Diagnoses: Pain, acute or chronic: actual or potential Potential alteration in comfort, pain Goals: Patient/caregiver will verbalize adequate pain control between visits Date Initiated: 11/23/2016 Target Resolution Date: 02/24/2017 Goal Status: Active Interventions: Complete pain assessment as per visit requirements Notes: ` Wound/Skin Impairment Nursing Diagnoses: Impaired tissue integrity Knowledge deficit related to smoking impact on wound healing Knowledge deficit related to ulceration/compromised skin integrity Goals: Ulcer/skin breakdown will have a volume reduction of 80% by week 12 Date Initiated: 11/23/2016 Target Resolution Date: 03/24/2017 Goal Status: Active Interventions: Assess patient/caregiver ability  to perform ulcer/skin care regimen upon admission and as needed Assess ulceration(s) every visit Provide education on smoking Notes: Electronic Signature(s) Signed: 01/04/2017 4:26:05 PM By: [name] Entered By:  [name] on 01/04/2017 85:27:78 [name], [name] ([id number]) -------------------------------------------------------------------------------- Pain Assessment Details Patient Name: [name], [name] Date of Service: 01/04/2017 8:00 AM Medical Record Number: [id number] Patient Account Number: [id number] Date of Birth/Sex: 1960-09-10 (56 y.o. Female) Treating RN: [name] Primary Care Narayan Scull: [name] Other Clinician: Referring Gyselle Matthew: [name] Treating Kiffany Schelling/Extender: [name] in Treatment: 6 Active Problems Location of Pain Severity and Description of Pain Patient Has Paino No Site Locations Pain Management and Medication Current Pain Management: Electronic Signature(s) Signed: 01/04/2017 4:26:05 PM By: [name] Entered By: [name] on 01/04/2017 08:04:45 [name], [name] ([id number]) -------------------------------------------------------------------------------- Patient/Caregiver Education Details Patient Name: [name], [name] Date of Service: 01/04/2017 8:00 AM Medical Record Number: [id number] Patient Account Number: [id number] Date of Birth/Gender: 03/18/1960 (56 y.o. Female) Treating RN: [name] Primary Care Physician: [name] Other Clinician: Referring Physician: Tracie [name] Treating Physician/Extender: [name] in Treatment: 6 Education Assessment Education Provided To: Patient Education Topics Provided Wound/Skin Impairment: Handouts: Caring for Your Ulcer, Other: do not get wrap wet Methods: Demonstration, Explain/Verbal Responses: State content correctly Electronic Signature(s) Signed: 01/04/2017 4:26:05 PM By: [name] Entered By:  [name] on 01/04/2017 08:47:42 [name], [name] ([id number]) -------------------------------------------------------------------------------- Wound Assessment Details Patient Name: Tesfaye, [name] R. Date of Service: 01/04/2017 8:00 AM Medical Record Number: [id number] Patient Account Number: [id number] Date of Birth/Sex: [birth year] (56 y.o. Female) Treating RN: [name] Primary Care Anahita Cua: [name] Other Clinician: Referring Brixon Zhen: [name] Treating Rylend Pietrzak/Extender: [name] in Treatment: 6 Wound Status Wound Number: 1 Primary Etiology: Arterial Insufficiency Ulcer Wound Location: Left Lower Leg - Lateral Secondary Trauma, Other Etiology: Wounding Event: Trauma Wound Status: Open Date Acquired: 10/26/2016 Comorbid History: Asthma, Sleep Apnea, Hypertension, Weeks Of Treatment: 6 Osteoarthritis Clustered Wound: No Photos Photo Uploaded By: [name] on 01/04/2017 16:21:04 Wound Measurements Length: (cm) 0.8 Width: (cm) 1 Depth: (cm) 0.2 Area: (cm) 0.628 Volume: (cm) 0.126 % Reduction in Area: 91.5% % Reduction in Volume: 82.9% Epithelialization: None Tunneling: No Undermining: No Wound Description Classification: Partial Thickness Wound Margin: Distinct, outline attached Exudate Amount: Large Exudate Type: Serosanguineous Exudate Color: red, brown Foul Odor After Cleansing: No Slough/Fibrino Yes Wound Bed Granulation Amount: None Present (0%) Necrotic Amount: Large (67-100%) Necrotic Quality: Adherent Slough Periwound Skin Texture Texture Color No Abnormalities Noted: No No Abnormalities Noted: No Scarring: Yes Erythema: Yes Erythema Location: Circumferential Moisture Mounts, Emera R. ([id number]) No Abnormalities Noted: No Temperature / Pain Maceration: No Temperature: No Abnormality Tenderness on Palpation: Yes Wound Preparation Ulcer Cleansing: Rinsed/Irrigated with Saline Topical Anesthetic  Applied: Other: lidocaine 4%, Treatment Notes Wound #1 (Left, Lateral Lower Leg) 1. Cleansed with: Clean wound with Normal Saline 2. Anesthetic Topical Lidocaine 4% cream to wound bed prior to debridement 4. Dressing Applied: Prisma Ag 5. Secondary Dressing Applied Non-Adherent pad 7. Secured with Tape 3 Layer Compression System - Left Lower Extremity Notes drawtex Electronic Signature(s) Signed: 01/04/2017 4:26:05 PM By: [name] Entered By: [name] on 01/04/2017 08:10:52 Erdahl, Cire R. ([id number]) -------------------------------------------------------------------------------- Wound Assessment Details Patient Name: Padmore, Melia R. Date of Service: 01/04/2017 8:00 AM Medical Record Number: [id number] Patient Account Number: [id number] Date of Birth/Sex: 1960/11/29 (56 y.o. Female) Treating RN: [name] Primary Care Momo Braun: [name] Other Clinician: Referring Charli Liberatore: [name] Treating Annelise Mccoy/Extender: [name] in Treatment: 6 Wound Status Wound Number: 2 Primary Trauma, Other Etiology: Wound Location: Left Lower Leg - Medial Wound Status: Open Wounding Event: Trauma Comorbid Asthma, Sleep Apnea, Hypertension, Date Acquired: 12/16/2016  History: Osteoarthritis Weeks Of Treatment: 2 Clustered Wound: No Photos Photo Uploaded By: [name] on 01/04/2017 16:21:17 Wound Measurements Length: (cm) 0.4 Width: (cm) 0.7 Depth: (cm) 0.1 Area: (cm) 0.22 Volume: (cm) 0.022 % Reduction in Area: 42.9% % Reduction in Volume: 42.1% Epithelialization: None Tunneling: No Undermining: No Wound Description Classification: Partial Thickness Wound Margin: Flat and Intact Exudate Amount: Large Exudate Type: Serosanguineous Exudate Color: red, brown Foul Odor After Cleansing: No Slough/Fibrino Yes Wound Bed Granulation Amount: Medium (34-66%) Exposed Structure Necrotic Amount: Medium (34-66%) Fascia Exposed:  No Necrotic Quality: Adherent Slough Fat Layer (Subcutaneous Tissue) Exposed: No Tendon Exposed: No Muscle Exposed: No Joint Exposed: No Bone Exposed: No Periwound Skin Texture Mier, Mercadez R. ([id number]) Texture Color No Abnormalities Noted: No No Abnormalities Noted: No Erythema: Yes Moisture Erythema Location: Circumferential No Abnormalities Noted: No Maceration: Yes Temperature / Pain Temperature: No Abnormality Tenderness on Palpation: Yes Wound Preparation Ulcer Cleansing: Rinsed/Irrigated with Saline Topical Anesthetic Applied: Other: lidocaine 4%, Treatment Notes Wound #2 (Left, Medial Lower Leg) 1. Cleansed with: Clean wound with Normal Saline 2. Anesthetic Topical Lidocaine 4% cream to wound bed prior to debridement 4. Dressing Applied: Prisma Ag 5. Secondary Dressing Applied Non-Adherent pad 7. Secured with Tape 3 Layer Compression System - Left Lower Extremity Notes drawtex Electronic Signature(s) Signed: 01/04/2017 4:26:05 PM By: [name] Entered By: [name] on 01/04/2017 08:09:25 Maday, Laurajean R. ([id number]) -------------------------------------------------------------------------------- Vitals Details Patient Name: Hermann, Preciosa R. Date of Service: 01/04/2017 8:00 AM Medical Record Number: [id number] Patient Account Number: [id number] Date of Birth/Sex: February 01, 1960 (56 y.o. Female) Treating RN: [name] Primary Care Soo Steelman: [name] Other Clinician: Referring [place] Gipe: [name] Treating Tirso Laws/Extender: [name] in Treatment: 6 Vital Signs Time Taken: 08:04 Temperature (F): 97.8 Height (in): 65 Pulse (bpm): 70 Weight (lbs): 294.4 Respiratory Rate (breaths/min): 20 Body Mass Index (BMI): 49 Blood Pressure (mmHg): 120/63 Reference Range: 80 - 120 mg / dl Electronic Signature(s) Signed: 01/04/2017 4:26:05 PM By: [name] Entered By: [name] on 01/04/2017 08:06:42

## 2017-01-06 NOTE — Progress Notes (Signed)
EYVA, CALIFANO ([id number]) Visit Report for 01/04/2017 Chief Complaint Document Details Patient Name: [name], [name] Date of Service: 01/04/2017 8:00 AM Medical Record Number: [id number] Patient Account Number: [id number] Date of Birth/Sex: Apr 10, 1960 (56 y.o. Female) Treating RN: Ahmed Prima Primary Care Provider: Tracie Harrier Other Clinician: Referring Provider: Tracie Harrier Treating Provider/Extender: Cathie Olden in Treatment: 6 Information Obtained from: Patient Chief Complaint She is here in follow up evaluation of left lower extremity ulcers Electronic Signature(s) Signed: 01/04/2017 4:55:16 PM By: Lawanda Cousins Entered By: Lawanda Cousins on 01/04/2017 08:31:09 Wesenberg, Elektra R. ([id number]) -------------------------------------------------------------------------------- Debridement Details Patient Name: Cromie, Akeyla R. Date of Service: 01/04/2017 8:00 AM Medical Record Number: [id number] Patient Account Number: [id number] Date of Birth/Sex: [birth year] (56 y.o. Female) Treating RN: Carolyne Fiscal, Debi Primary Care Provider: Tracie Harrier Other Clinician: Referring Provider: Tracie Harrier Treating Provider/Extender: Cathie Olden in Treatment: 6 Debridement Performed for Wound #1 Left,Lateral Lower Leg Assessment: Performed By: Physician Lawanda Cousins, NP Debridement: Open Wound/Selective Severity of Tissue Pre Fat layer exposed Debridement: Debridement Description: Selective Pre-procedure Verification/Time Yes - 08:18 Out Taken: Start Time: 08:19 Pain Control: Lidocaine 4% Topical Solution Level: Non-Viable Tissue Total Area Debrided (L x W): 0.8 (cm) x 1 (cm) = 0.8 (cm) Tissue and other material Non-Viable, Exudate, Fibrin/Slough debrided: Instrument: Blade Bleeding: Minimum Hemostasis Achieved: Pressure End Time: 08:21 Procedural Pain: 0 Post Procedural Pain: 0 Response to Treatment: Procedure was tolerated well Post  Debridement Measurements of Total Wound Length: (cm) 0.8 Width: (cm) 1 Depth: (cm) 0.2 Volume: (cm) 0.126 Character of Wound/Ulcer Post Debridement: Requires Further Debridement Severity of Tissue Post Debridement: Fat layer exposed Post Procedure Diagnosis Same as Pre-procedure Electronic Signature(s) Signed: 01/04/2017 4:26:05 PM By: Alric Quan Signed: 01/04/2017 4:55:16 PM By: Lawanda Cousins Entered By: Lawanda Cousins on 01/04/2017 08:30:16 Lona, Eliah R. ([id number]) -------------------------------------------------------------------------------- Debridement Details Patient Name: [name], [name] Date of Service: 01/04/2017 8:00 AM Medical Record Number: [id number] Patient Account Number: [id number] Date of Birth/Sex: [birth year] (56 y.o. Female) Treating RN: Carolyne Fiscal, Debi Primary Care Provider: Tracie Harrier Other Clinician: Referring Provider: Tracie Harrier Treating Provider/Extender: Cathie Olden in Treatment: 6 Debridement Performed for Wound #2 Left,Medial Lower Leg Assessment: Performed By: Physician Lawanda Cousins, NP Debridement: Open Wound/Selective Debridement Description: Selective Pre-procedure Verification/Time Yes - 08:18 Out Taken: Start Time: 08:21 Pain Control: Lidocaine 4% Topical Solution Level: Non-Viable Tissue Total Area Debrided (L x W): 0.4 (cm) x 0.7 (cm) = 0.28 (cm) Tissue and other material Non-Viable, Exudate, Fibrin/Slough debrided: Instrument: Blade Bleeding: Minimum Hemostasis Achieved: Pressure End Time: 08:22 Procedural Pain: 0 Post Procedural Pain: 0 Response to Treatment: Procedure was tolerated well Post Debridement Measurements of Total Wound Length: (cm) 0.4 Width: (cm) 0.7 Depth: (cm) 0.2 Volume: (cm) 0.044 Character of Wound/Ulcer Post Debridement: Requires Further Debridement Post Procedure Diagnosis Same as Pre-procedure Electronic Signature(s) Signed: 01/04/2017 4:26:05 PM By: Alric Quan Signed: 01/04/2017 4:55:16 PM By: Lawanda Cousins Entered By: Lawanda Cousins on 01/04/2017 08:30:45 Kopko, Kiala R. ([id number]) -------------------------------------------------------------------------------- HPI Details Patient Name: [name], [name] Date of Service: 01/04/2017 8:00 AM Medical Record Number: [id number] Patient Account Number: [id number] Date of Birth/Sex: 12/23/1960 (56 y.o. Female) Treating RN: Carolyne Fiscal, Debi Primary Care Provider: Tracie Harrier Other Clinician: Referring Provider: Tracie Harrier Treating Provider/Extender: Cathie Olden in Treatment: 6 History of Present Illness Location: Patient presents with a wound to left lower leg. Quality: Patient reports experiencing a sharp pain to affected area(s). Severity: Patient states wound are getting worse. Duration: Patient has had the wound  for < 4 weeks prior to presenting for treatment Timing: Pain in wound is Intermittent (comes and goes Context: The wound occurred when the patient had a laceration with a sharp object Modifying Factors: Other treatment(s) tried include:suturing done in the ER and sutures removed on 2 weeks Associated Signs and Symptoms: Patient reports having increase discharge. HPI Description: 56 year old patient seen by her PCP Dr. Ginette Pitman, for a follow-up of her left leg lacerated wound had sutures removed a week before she was seen on 11/15/2016. there was swelling and erythema and the edges of the laceration had not completely healed and she had completed a course of doxycycline. She had quit smoking about a year ago. Past medical history significant for anxiety states, asthma, endometriosis, GERD, obesity, sleep apnea, status post appendectomy, cardiac catheterization, cholecystectomy, multiple colonoscopies and EGDs, hysterectomy, knee arthroscopies. she recently restarted smoking after quitting for over 20 years 12/21/2016 -- she now presents with a new wound to the left  lower third of her leg where her dog bit her and caused a puncture wound which is now been draining and has some necrotic debris. She was seen by her PCP and he has put her on doxycycline orally. She has given up smoking this last week. 12/28/2016 -- she is using her Santyl ointment for the new wound on the lower leg and has been draining significantly. 01/04/17- she is here in follow-up evaluation for left lower extremity wounds. She is voicing no complaints or concerns. Wounds have improved in appearance, will add compression therapy Electronic Signature(s) Signed: 01/04/2017 4:55:16 PM By: Lawanda Cousins Entered By: Lawanda Cousins on 01/04/2017 08:31:47 Gebbia, Briea R. ([id number]) -------------------------------------------------------------------------------- Physician Orders Details Patient Name: Tijerina, Sabriel R. Date of Service: 01/04/2017 8:00 AM Medical Record Number: [id number] Patient Account Number: [id number] Date of Birth/Sex: 03/25/1960 (56 y.o. Female) Treating RN: Carolyne Fiscal, Debi Primary Care Provider: Tracie Harrier Other Clinician: Referring Provider: Tracie Harrier Treating Provider/Extender: Cathie Olden in Treatment: 6 Verbal / Phone Orders: Yes Clinician: Carolyne Fiscal, Debi Read Back and Verified: Yes Diagnosis Coding Wound Cleansing Wound #1 Left,Lateral Lower Leg o Clean wound with Normal Saline. o Cleanse wound with mild soap and water o May Shower, gently pat wound dry prior to applying new dressing. o May shower with protection. Wound #2 Left,Medial Lower Leg o Clean wound with Normal Saline. o Cleanse wound with mild soap and water o May Shower, gently pat wound dry prior to applying new dressing. o May shower with protection. Anesthetic (add to Medication List) Wound #1 Left,Lateral Lower Leg o Topical Lidocaine 4% cream applied to wound bed prior to debridement (In Clinic Only). Wound #2 Left,Medial Lower Leg o Topical  Lidocaine 4% cream applied to wound bed prior to debridement (In Clinic Only). Primary Wound Dressing Wound #1 Left,Lateral Lower Leg o Prisma Ag Wound #2 Left,Medial Lower Leg o Prisma Ag Secondary Dressing Wound #1 Left,Lateral Lower Leg o Dry Gauze o Drawtex Wound #2 Left,Medial Lower Leg o Dry Gauze o Drawtex Dressing Change Frequency Wound #1 Left,Lateral Lower Leg o Change dressing every week Wound #2 Left,Medial Lower Leg o Change dressing every week Rauen, Marylyn R. ([id number]) Follow-up Appointments o Return Appointment in 1 week. Edema Control Wound #1 Left,Lateral Lower Leg o 3 Layer Compression System - Left Lower Extremity - unna to anchor o Elevate legs to the level of the heart and pump ankles as often as possible Wound #2 Left,Medial Lower Leg o 3 Layer Compression System - Left Lower Extremity - unna to  anchor o Elevate legs to the level of the heart and pump ankles as often as possible Additional Orders / Instructions Wound #1 Left,Lateral Lower Leg o Stop Smoking - continue to o Increase protein intake. Wound #2 Left,Medial Lower Leg o Stop Smoking - continue to o Increase protein intake. Medications-please add to medication list. Wound #1 Left,Lateral Lower Leg o Other: - Vitamin C, Vitamin A, Zinc Wound #2 Left,Medial Lower Leg o Other: - Vitamin C, Vitamin A, Zinc Patient Medications Allergies: Ceftin, myocin, penicillin, sulfur, Darvocet-N, Percocet, red dye, shell fish, Iodinated Contrast- Oral and IV Dye, Phenergan, saccharin, Aminoglycosides, latex, influenza vaccines, Cefotan, cefuroxime Notifications Medication Indication Start End lidocaine DOSE 1 - topical 4 % cream - 1 cream topical Electronic Signature(s) Signed: 01/04/2017 4:26:05 PM By: Alric Quan Signed: 01/04/2017 4:55:16 PM By: Lawanda Cousins Entered By: Alric Quan on 01/04/2017 08:49:09 Losee, Manya R.  ([id number]) -------------------------------------------------------------------------------- Prescription 01/04/2017 Patient Name: Vey, Sadie R. Provider: Lawanda Cousins NP Date of Birth: 1960/02/14 NPI#: [id number] Sex: F DEA#: [id number] Phone #: [phone] License #: Patient Address: [place], [place] [address] [address], [place], [place] [address] [phone] Allergies Ceftin myocin penicillin sulfur Darvocet-N Percocet red dye shell fish Iodinated Contrast- Oral and IV Dye Phenergan saccharin Aminoglycosides latex influenza vaccines Cefotan cefuroxime Deloach, Gara R. ([id number]) Medication Medication: Route: Strength: Form: lidocaine topical 4% cream Class: TOPICAL LOCAL ANESTHETICS Dose: Frequency / Time: Indication: 1 1 cream topical Number of Refills: Number of Units: 0 Generic Substitution: Start Date: End Date: Administered at [facility]: No Note to Pharmacy: Signature(s): Date(s): Electronic Signature(s) Signed: 01/04/2017 4:26:05 PM By: Alric Quan Signed: 01/04/2017 4:55:16 PM By: Lawanda Cousins Entered By: Alric Quan on 01/04/2017 08:49:10 Byland, Hera R. ([id number]) --------------------------------------------------------------------------------  Problem List Details Patient Name: Lakey, Lucerito R. Date of Service: 01/04/2017 8:00 AM Medical Record Number: [id number] Patient Account Number: [id number] Date of Birth/Sex: 13-Feb-1960 (56 y.o. 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 ([id number]) -------------------------------------------------------------------------------- Progress Note Details Patient Name: Obi, Neville R. Date of Service: 01/04/2017 8:00 AM Medical Record Number: [id number] Patient Account Number: [id number] Date of Birth/Sex: Mar 13, 1960 (56 y.o. Female) Treating RN: Ahmed Prima Primary Care Provider: Tracie Harrier Other Clinician: Referring Provider: Tracie Harrier Treating Provider/Extender: Cathie Olden in Treatment: 6 Subjective Chief Complaint Information obtained from Patient She is here in follow up evaluation of left lower extremity ulcers History of Present Illness (HPI) The following HPI elements were documented for the patient's wound: Location: Patient presents with a wound to left lower leg. Quality: Patient reports experiencing a sharp pain to affected area(s). Severity: Patient states wound are getting worse. Duration: Patient has had the wound for < 4 weeks prior to presenting for treatment Timing: Pain in  wound is Intermittent (comes and goes Context: The wound occurred when the patient had a laceration with a sharp object Modifying Factors: Other treatment(s) tried include:suturing done in the ER and sutures removed on 2 weeks Associated Signs and Symptoms: Patient reports having increase discharge. 56 year old patient seen by her  PCP Dr. Ginette Pitman, for a follow-up of her left leg lacerated wound had sutures removed a week before she was seen on 11/15/2016. there was swelling and erythema and the edges of the laceration had not completely healed and she had completed a course of doxycycline. She had quit smoking about a year ago. Past medical history significant for anxiety states, asthma, endometriosis, GERD, obesity, sleep apnea, status post appendectomy, cardiac catheterization, cholecystectomy, multiple colonoscopies and EGDs, hysterectomy, knee arthroscopies. she recently restarted smoking after quitting for over 20 years 12/21/2016 -- she now presents with a new wound to the left lower third of her leg where her dog bit her and caused a puncture wound which is now been draining and has some necrotic debris. She was seen by her PCP and he has put her on doxycycline orally. She has given up smoking this last week. 12/28/2016 -- she is using her Santyl ointment for the new wound on the lower leg and has been draining significantly. 01/04/17- she is here in follow-up evaluation for left lower extremity wounds. She is voicing no complaints or concerns. Wounds have improved in appearance, will add compression therapy Patient History Information obtained from Patient. Family History Cancer - Father, Diabetes - Mother,Siblings, Heart Disease - Mother,Siblings, Hypertension - Mother,Father,Siblings, Lung Disease - Mother,Father,Siblings, Stroke - Mother,Father, Thyroid Problems - Father,Siblings, No family history of Hereditary Spherocytosis, Kidney Disease, Seizures, Tuberculosis. Social History Current every day smoker - 3 cigarettes a day, Marital Status - Married, Alcohol Use - Never, Drug Use - No History, Caffeine Use - Daily. Caserta, Aleza R. ([id number]) Objective Constitutional Vitals Time Taken: 8:04 AM, Height: 65 in, Weight: 294.4 lbs, BMI: 49, Temperature: 97.8 F, Pulse: 70 bpm, Respiratory Rate: 20  breaths/min, Blood Pressure: 120/63 mmHg. Integumentary (Hair, Skin) Wound #1 status is Open. Original cause of wound was Trauma. The wound is located on the Left,Lateral Lower Leg. The wound measures 0.8cm length x 1cm width x 0.2cm depth; 0.628cm^2 area and 0.126cm^3 volume. There is no tunneling or undermining noted. There is a large amount of serosanguineous drainage noted. The wound margin is distinct with the outline attached to the wound base. There is no granulation within the wound bed. There is a large (67-100%) amount of necrotic tissue within the wound bed including Adherent Slough. The periwound skin appearance exhibited: Scarring, Erythema. The periwound skin appearance did not exhibit: Maceration. The surrounding wound skin color is noted with erythema which is circumferential. Periwound temperature was noted as No Abnormality. The periwound has tenderness on palpation. Wound #2 status is Open. Original cause of wound was Trauma. The wound is located on the Left,Medial Lower Leg. The wound measures 0.4cm length x 0.7cm width x 0.1cm depth; 0.22cm^2 area and 0.022cm^3 volume. There is no tunneling or undermining noted. There is a large amount of serosanguineous drainage noted. The wound margin is flat and intact. There is medium (34-66%) granulation within the wound bed. There is a medium (34-66%) amount of necrotic tissue within the wound bed including Adherent Slough. The periwound skin appearance exhibited: Maceration, Erythema. The surrounding wound skin color is noted with erythema which is circumferential. Periwound temperature was noted as No Abnormality. The periwound has tenderness on  palpation. 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 There was a Non-Viable Tissue Open Wound/Selective [phone]) debridement with total area of 0.8 sq cm performed by Lawanda Cousins, NP. with the following instrument(s): Blade to remove Non-Viable tissue/material including Exudate and Fibrin/Slough after achieving pain control using Lidocaine 4% Topical Cruey, Livian R. ([id number]) Solution. A time out was conducted at 08:18, prior to the start of the procedure. A Minimum amount of bleeding was controlled with Pressure. The procedure was tolerated well with a pain level of 0 throughout and a pain level of 0 following the procedure. Post Debridement Measurements: 0.8cm length x 1cm width x 0.2cm depth; 0.126cm^3 volume. Character of Wound/Ulcer Post Debridement requires further debridement. Severity of Tissue Post Debridement is: Fat layer exposed. Post procedure Diagnosis Wound #1: Same as Pre-Procedure Wound #2 Pre-procedure diagnosis of Wound #2 is a Trauma, Other located on the Left,Medial Lower Leg . There was a Non-Viable Tissue Open Wound/Selective [phone] debridement with total area of 0.28 sq cm performed by Lawanda Cousins, NP. with the following instrument(s): Blade to remove Non-Viable tissue/material including Exudate and Fibrin/Slough after achieving pain control using Lidocaine 4% Topical Solution. A time out was conducted at 08:18, prior to the start of the procedure. A Minimum amount of bleeding was controlled with Pressure. The procedure was tolerated well with a pain level of 0 throughout and a pain level of 0 following the procedure. Post Debridement  Measurements: 0.4cm length x 0.7cm width x 0.2cm depth; 0.044cm^3 volume. Character of Wound/Ulcer Post Debridement requires further debridement. Post procedure Diagnosis Wound #2: Same as Pre-Procedure Plan Wound Cleansing: Wound #1 Left,Lateral Lower Leg: Clean wound with Normal Saline. Cleanse wound with mild soap and water May Shower, gently pat wound dry prior to applying new dressing. May shower with protection. Wound #2 Left,Medial Lower Leg: Clean wound with Normal Saline. Cleanse wound with mild soap and water May Shower, gently pat wound dry prior to applying new dressing. May shower with protection. Anesthetic (add to Medication List): Wound #1 Left,Lateral Lower Leg: Topical Lidocaine 4% cream applied to wound bed prior to debridement (In Clinic Only). Wound #2 Left,Medial Lower Leg: Topical Lidocaine 4% cream applied to wound bed prior to debridement (In Clinic Only). Primary Wound Dressing: Wound #1 Left,Lateral Lower Leg: Prisma Ag Wound #2 Left,Medial Lower Leg: Prisma Ag Secondary Dressing: Wound #1 Left,Lateral Lower Leg: Dry Gauze Drawtex Wound #2 Left,Medial Lower Leg: Dry Gauze Drawtex Dressing Change Frequency: Wound #1 Left,Lateral Lower Leg: Change dressing every week Wound #2 Left,Medial Lower Leg: Change dressing every week Gernert, Vanshika R. ([id number]) Follow-up Appointments: Return Appointment in 1 week. Edema Control: Wound #1 Left,Lateral Lower Leg: 3 Layer Compression System - Left Lower Extremity - unna to anchor Elevate legs to the level of the heart and pump ankles as often as possible Wound #2 Left,Medial Lower Leg: 3 Layer Compression System - Left Lower Extremity - unna to anchor Elevate legs to the level of the heart and pump ankles as  often as possible Additional Orders / Instructions: Wound #1 Left,Lateral Lower Leg: Stop Smoking - continue to Increase protein intake. Wound #2 Left,Medial Lower Leg: Stop Smoking - continue  to Increase protein intake. Medications-please add to medication list.: Wound #1 Left,Lateral Lower Leg: Other: - Vitamin C, Vitamin A, Zinc Wound #2 Left,Medial Lower Leg: Other: - Vitamin C, Vitamin A, Zinc The following medication(s) was prescribed: lidocaine topical 4 % cream 1 1 cream topical 1. collagen to both ulcers 2. add profore lite 3. follow up next week Electronic Signature(s) Signed: 01/04/2017 4:55:16 PM By: Lawanda Cousins Entered By: Lawanda Cousins on 01/04/2017 12:46:51 Fort, Cherylanne R. ([id number]) -------------------------------------------------------------------------------- ROS/PFSH Details Patient Name: Vassar, Marcellina R. Date of Service: 01/04/2017 8:00 AM Medical Record Number: [id number] Patient Account Number: [id number] Date of Birth/Sex: Sep 20, 1960 (56 y.o. Female) Treating RN: Carolyne Fiscal, Debi Primary Care Provider: Tracie Harrier Other Clinician: Referring Provider: Tracie Harrier Treating Provider/Extender: Cathie Olden in Treatment: 6 Information Obtained From Patient Wound History Do you currently have one or more open woundso Yes How many open wounds do you currently haveo 1 Approximately how long have you had your woundso 4 weeks How have you been treating your wound(s) until nowo saline and neosporin Has your wound(s) ever healed and then re-openedo No Have you had any lab work done in the past montho No Have you tested positive for an antibiotic resistant organism (MRSA, VRE)o No Have you tested positive for osteomyelitis (bone infection)o No Have you had any tests for circulation on your legso Yes Who ordered the testo dr. dew [facility] the test [facility] Have you had other problems associated with your woundso Swelling Respiratory Medical History: Positive for: Asthma; Sleep Apnea Cardiovascular Medical History: Positive for: Hypertension Musculoskeletal Medical History: Positive for:  Osteoarthritis Immunizations Pneumococcal Vaccine: Received Pneumococcal Vaccination: Yes Implantable Devices Family and Social History Cancer: Yes - Father; Diabetes: Yes - Mother,Siblings; Heart Disease: Yes - Mother,Siblings; Hereditary Spherocytosis: No; Hypertension: Yes - Mother,Father,Siblings; Kidney Disease: No; Lung Disease: Yes - Mother,Father,Siblings; Seizures: No; Stroke: Yes - Mother,Father; Thyroid Problems: Yes - Father,Siblings; Tuberculosis: No; Current every day smoker - 3 cigarettes a day; Marital Status - Married; Alcohol Use: Never; Drug Use: No History; Caffeine Use: Daily; Financial Concerns: No; Food, Clothing or Shelter Needs: No; Support System Lacking: No; Transportation Concerns: No; Advanced Directives: No; Patient does not want information on Advanced Directives; Do not resuscitate: No; Living Will: No; Medical Power of Attorney: No Electronic Signature(s) Guidroz, Riot R. ([id number]) Signed: 01/04/2017 4:26:05 PM By: Alric Quan Signed: 01/04/2017 4:55:16 PM By: Lawanda Cousins Entered By: Lawanda Cousins on 01/04/2017 08:31:59 Igarashi, Adelheid R. ([id number]) -------------------------------------------------------------------------------- SuperBill Details Patient Name: Brisky, Destanie R. Date of Service: 01/04/2017 Medical Record Number: [id number] Patient Account Number: [id number] Date of Birth/Sex: 1960-08-14 (56 y.o. Female) Treating RN: Carolyne Fiscal, Debi Primary Care Provider: Tracie Harrier Other Clinician: Referring Provider: Tracie Harrier Treating Provider/Extender: Cathie Olden in Treatment: 6 Diagnosis Coding ICD-10 Codes Code Description [phone] Non-pressure 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 exposed S81.812D Laceration without foreign body, left lower leg, subsequent encounter E66.01 Morbid (severe) obesity due to excess calories F17.218 Nicotine dependence, cigarettes, with other nicotine-induced disorders S81.852D Open  bite, left lower leg, subsequent encounter 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d Facility Procedures CPT4 Code: 34193790 Description: 24097 - DEBRIDE WOUND 1ST 20 SQ CM OR < ICD-10 Diagnosis Description L97.222 Non-pressure 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 expos S81.852D Open bite, left  lower leg, subsequent encounter Modifier: ed Quantity: 1 Physician Procedures CPT4 Code: 5830940 Description: 76808 - WC PHYS DEBR WO ANESTH 20 SQ CM ICD-10 Diagnosis Description 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

## 2017-01-11 ENCOUNTER — Ambulatory Visit: Payer: Medicare Other | Admitting: Surgery

## 2017-01-11 ENCOUNTER — Other Ambulatory Visit: Payer: Self-pay | Admitting: Psychiatry

## 2017-01-18 ENCOUNTER — Encounter: Payer: Medicare Other | Attending: Physician Assistant | Admitting: Physician Assistant

## 2017-01-18 DIAGNOSIS — K219 Gastro-esophageal reflux disease without esophagitis: Secondary | ICD-10-CM | POA: Insufficient documentation

## 2017-01-18 DIAGNOSIS — I10 Essential (primary) hypertension: Secondary | ICD-10-CM | POA: Diagnosis not present

## 2017-01-18 DIAGNOSIS — S81852A Open bite, left lower leg, initial encounter: Secondary | ICD-10-CM | POA: Insufficient documentation

## 2017-01-18 DIAGNOSIS — L97222 Non-pressure 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 exposed: Secondary | ICD-10-CM | POA: Diagnosis present

## 2017-01-18 DIAGNOSIS — F419 Anxiety disorder, unspecified: Secondary | ICD-10-CM | POA: Insufficient documentation

## 2017-01-18 DIAGNOSIS — F17218 Nicotine dependence, cigarettes, with other nicotine-induced disorders: Secondary | ICD-10-CM | POA: Diagnosis not present

## 2017-01-18 DIAGNOSIS — G4730 Sleep apnea, unspecified: Secondary | ICD-10-CM | POA: Diagnosis not present

## 2017-01-18 DIAGNOSIS — Z6841 Body Mass Index (BMI) 40.0 and over, adult: Secondary | ICD-10-CM | POA: Diagnosis not present

## 2017-01-18 DIAGNOSIS — W540XXA Bitten by dog, initial encounter: Secondary | ICD-10-CM | POA: Diagnosis not present

## 2017-01-18 DIAGNOSIS — M1990 Unspecified osteoarthritis, unspecified site: Secondary | ICD-10-CM | POA: Diagnosis not present

## 2017-01-18 DIAGNOSIS — S81812A Laceration without foreign body, left lower leg, initial encounter: Secondary | ICD-10-CM | POA: Diagnosis not present

## 2017-01-18 DIAGNOSIS — J45909 Unspecified asthma, uncomplicated: Secondary | ICD-10-CM | POA: Diagnosis not present

## 2017-01-19 NOTE — Progress Notes (Signed)
JAMELAH, SITZER ([id number]) Visit Report for 01/18/2017 Arrival Information Details Patient Name: Coss, Delsie R. Date of Service: 01/18/2017 8:00 AM Medical Record Number: [id number] Patient Account Number: [id number] Date of Birth/Sex: [birth year] (57 y.o. Female) Treating RN: Carolyne Fiscal, Debi Primary Care Allie Ousley: Tracie Harrier Other Clinician: Referring Noel Rodier: Tracie Harrier Treating Kammie Scioli/Extender: Melburn Hake, HOYT Weeks in Treatment: 8 Visit Information History Since Last Visit All ordered tests and consults were completed: No Patient Arrived: Ambulatory Added or deleted any medications: No Arrival Time: 08:10 Any new allergies or adverse reactions: No Accompanied By: self Had a fall or experienced change in No Transfer Assistance: None activities of daily living that may affect Patient Identification Verified: Yes risk of falls: Secondary Verification Process Completed: Yes Signs or symptoms of abuse/neglect since last visito No Patient Requires Transmission-Based No Hospitalized since last visit: No Precautions: Has Dressing in Place as Prescribed: Yes Patient Has Alerts: No Pain Present Now: No Electronic Signature(s) Signed: 01/18/2017 3:19:11 PM By: Alric Quan Entered By: Alric Quan on 01/18/2017 15:19:11 Stigler, Avya R. ([id number]) -------------------------------------------------------------------------------- Clinic Level of Care Assessment Details Patient Name: Palomo, Macey R. Date of Service: 01/18/2017 8:00 AM Medical Record Number: [id number] Patient Account Number: [id number] Date of Birth/Sex: [birth year] (57 y.o. Female) Treating RN: Carolyne Fiscal, Debi Primary Care Balen Woolum: Tracie Harrier Other Clinician: Referring Dustin Bumbaugh: Tracie Harrier Treating Lilyonna Steidle/Extender: Melburn Hake, HOYT Weeks in Treatment: 8 Clinic Level of Care Assessment Items TOOL 4 Quantity Score X - Use when only an EandM is performed on FOLLOW-UP visit 1  0 ASSESSMENTS - Nursing Assessment / Reassessment X - Reassessment of Co-morbidities (includes updates in patient status) 1 10 X- 1 5 Reassessment of Adherence to Treatment Plan ASSESSMENTS - Wound and Skin Assessment / Reassessment []  - Simple Wound Assessment / Reassessment - one wound 0 X- 2 5 Complex Wound Assessment / Reassessment - multiple wounds []  - 0 Dermatologic / Skin Assessment (not related to wound area) ASSESSMENTS - Focused Assessment []  - Circumferential Edema Measurements - multi extremities 0 []  - 0 Nutritional Assessment / Counseling / Intervention []  - 0 Lower Extremity Assessment (monofilament, tuning fork, pulses) []  - 0 Peripheral Arterial Disease Assessment (using hand held doppler) ASSESSMENTS - Ostomy and/or Continence Assessment and Care []  - Incontinence Assessment and Management 0 []  - 0 Ostomy Care Assessment and Management (repouching, etc.) PROCESS - Coordination of Care X - Simple Patient / Family Education for ongoing care 1 15 []  - 0 Complex (extensive) Patient / Family Education for ongoing care []  - 0 Staff obtains Programmer, systems, Records, Test Results / Process Orders []  - 0 Staff telephones HHA, Nursing Homes / Clarify orders / etc []  - 0 Routine Transfer to another Facility (non-emergent condition) []  - 0 Routine Hospital Admission (non-emergent condition) []  - 0 New Admissions / Biomedical engineer / Ordering NPWT, Apligraf, etc. []  - 0 Emergency Hospital Admission (emergent condition) X- 1 10 Simple Discharge Coordination Sann, Misty R. ([id number]) []  - 0 Complex (extensive) Discharge Coordination PROCESS - Special Needs []  - Pediatric / Minor Patient Management 0 []  - 0 Isolation Patient Management []  - 0 Hearing / Language / Visual special needs []  - 0 Assessment of Community assistance (transportation, D/C planning, etc.) []  - 0 Additional assistance / Altered mentation []  - 0 Support Surface(s) Assessment (bed,  cushion, seat, etc.) INTERVENTIONS - Wound Cleansing / Measurement []  - Simple Wound Cleansing - one wound 0 X- 2 5 Complex Wound Cleansing - multiple wounds X- 1 5 Wound Imaging (photographs - any  number of wounds) []  - 0 Wound Tracing (instead of photographs) []  - 0 Simple Wound Measurement - one wound X- 2 5 Complex Wound Measurement - multiple wounds INTERVENTIONS - Wound Dressings X - Small Wound Dressing one or multiple wounds 2 10 []  - 0 Medium Wound Dressing one or multiple wounds []  - 0 Large Wound Dressing one or multiple wounds X- 1 5 Application of Medications - topical []  - 0 Application of Medications - injection INTERVENTIONS - Miscellaneous []  - External ear exam 0 []  - 0 Specimen Collection (cultures, biopsies, blood, body fluids, etc.) []  - 0 Specimen(s) / Culture(s) sent or taken to Lab for analysis []  - 0 Patient Transfer (multiple staff / Civil Service fast streamer / Similar devices) []  - 0 Simple Staple / Suture removal (25 or less) []  - 0 Complex Staple / Suture removal (26 or more) []  - 0 Hypo / Hyperglycemic Management (close monitor of Blood Glucose) []  - 0 Ankle / Brachial Index (ABI) - do not check if billed separately X- 1 5 Vital Signs Doescher, Abbegale R. ([id number]) Has the patient been seen at the hospital within the last three years: Yes Total Score: 105 Level Of Care: New/Established - Level 3 Electronic Signature(s) Signed: 01/18/2017 4:31:09 PM By: Alric Quan Entered By: Alric Quan on 01/18/2017 10:33:47 Kenna, Akiba R. ([id number]) -------------------------------------------------------------------------------- Encounter Discharge Information Details Patient Name: Glahn, Forrestine R. Date of Service: 01/18/2017 8:00 AM Medical Record Number: [id number] Patient Account Number: [id number] Date of Birth/Sex: 26-Jun-1960 (57 y.o. Female) Treating RN: Carolyne Fiscal, Debi Primary Care Toshika Parrow: Tracie Harrier Other Clinician: Referring Jacquelynne Guedes:  Tracie Harrier Treating Raye Slyter/Extender: Melburn Hake, HOYT Weeks in Treatment: 8 Encounter Discharge Information Items Discharge Pain Level: 0 Discharge Condition: Stable Ambulatory Status: Ambulatory Discharge Destination: Home Transportation: Private Auto Accompanied By: self Schedule Follow-up Appointment: Yes Medication Reconciliation completed and No provided to Patient/Care Victorina Kable: Provided on Clinical Summary of Care: 01/18/2017 Form Type Recipient Paper Patient RD Electronic Signature(s) Signed: 01/19/2017 8:42:55 AM By: Ruthine Dose Entered By: Ruthine Dose on 01/18/2017 08:38:02 Maul, Ashia R. ([id number]) -------------------------------------------------------------------------------- Lower Extremity Assessment Details Patient Name: Lehigh, Belisa R. Date of Service: 01/18/2017 8:00 AM Medical Record Number: [id number] Patient Account Number: [id number] Date of Birth/Sex: 05/11/1960 (56 y.o. Female) Treating RN: Carolyne Fiscal, Debi Primary Care Corley Kohls: Tracie Harrier Other Clinician: Referring Pao Haffey: Tracie Harrier Treating Norah Devin/Extender: Melburn Hake, HOYT Weeks in Treatment: 8 Vascular Assessment Pulses: Dorsalis Pedis Palpable: [Left:Yes] Posterior Tibial Extremity colors, hair growth, and conditions: Extremity Color: [Left:Mottled] Temperature of Extremity: [Left:Warm] Capillary Refill: [Left:< 3 seconds] Toe Nail Assessment Left: Right: Thick: No Discolored: No Deformed: No Improper Length and Hygiene: Yes Electronic Signature(s) Signed: 01/18/2017 4:31:09 PM By: Alric Quan Entered By: Alric Quan on 01/18/2017 08:16:12 Baroni, Colandra R. ([id number]) -------------------------------------------------------------------------------- Multi Wound Chart Details Patient Name: Altschuler, Shandrea R. Date of Service: 01/18/2017 8:00 AM Medical Record Number: [id number] Patient Account Number: [id number] Date of Birth/Sex: October 01, 1960 (56 y.o.  Female) Treating RN: Carolyne Fiscal, Debi Primary Care Jaemarie Hochberg: Tracie Harrier Other Clinician: Referring Arleth Mccullar: Tracie Harrier Treating Zyra Parrillo/Extender: Melburn Hake, HOYT Weeks in Treatment: 8 Vital Signs Height(in): 65 Pulse(bpm): 88 Weight(lbs): 294.4 Blood Pressure(mmHg): 142/64 Body Mass Index(BMI): 49 Temperature(F): 97.6 Respiratory Rate 20 (breaths/min): Photos: [1:No Photos] [2:No Photos] [N/A:N/A] Wound Location: [1:Left Lower Leg - Lateral] [2:Left Lower Leg - Medial] [N/A:N/A] Wounding Event: [1:Trauma] [2:Trauma] [N/A:N/A] Primary Etiology: [1:Arterial Insufficiency Ulcer] [2:Trauma, Other] [N/A:N/A] Secondary Etiology: [1:Trauma, Other] [2:N/A] [N/A:N/A] Comorbid History: [1:Asthma, Sleep Apnea, Hypertension, Osteoarthritis] [2:Asthma, Sleep Apnea, Hypertension, Osteoarthritis] [N/A:N/A] Date  Acquired: [1:10/26/2016] [2:12/16/2016] [N/A:N/A] Weeks of Treatment: [1:8] [2:4] [N/A:N/A] Wound Status: [1:Open] [2:Open] [N/A:N/A] Measurements L x W x D [1:0.2x0.2x0.1] [2:0.4x0.6x0.1] [N/A:N/A] (cm) Area (cm) : [1:0.031] [2:0.188] [N/A:N/A] Volume (cm) : [1:0.003] [2:0.019] [N/A:N/A] % Reduction in Area: [1:99.60%] [2:51.20%] [N/A:N/A] % Reduction in Volume: [1:99.60%] [2:50.00%] [N/A:N/A] Classification: [1:Partial Thickness] [2:Partial Thickness] [N/A:N/A] Exudate Amount: [1:Large] [2:Large] [N/A:N/A] Exudate Type: [1:Serous] [2:Serous] [N/A:N/A] Exudate Color: [1:amber] [2:amber] [N/A:N/A] Wound Margin: [1:Distinct, outline attached] [2:Flat and Intact] [N/A:N/A] Granulation Amount: [1:Large (67-100%)] [2:Medium (34-66%)] [N/A:N/A] Granulation Quality: [1:Red] [2:N/A] [N/A:N/A] Necrotic Amount: [1:Small (1-33%)] [2:Medium (34-66%)] [N/A:N/A] Epithelialization: [1:None] [2:None] [N/A:N/A] Periwound Skin Texture: [1:Scarring: Yes] [2:No Abnormalities Noted] [N/A:N/A] Periwound Skin Moisture: [1:Maceration: No] [2:Maceration: Yes] [N/A:N/A] Periwound Skin  Color: [1:Erythema: Yes] [2:Erythema: Yes] [N/A:N/A] Erythema Location: [1:Circumferential] [2:Circumferential] [N/A:N/A] Temperature: [1:No Abnormality] [2:No Abnormality] [N/A:N/A] Tenderness on Palpation: [1:Yes] [2:Yes] [N/A:N/A] Wound Preparation: [1:Ulcer Cleansing: Rinsed/Irrigated with Saline] [2:Ulcer Cleansing: Rinsed/Irrigated with Saline] [N/A:N/A] Topical Anesthetic Applied: Topical Anesthetic Applied: Other: lidocaine 4% Other: lidocaine 4% Horger, Eboni R. ([id number]) Treatment Notes Electronic Signature(s) Signed: 01/18/2017 4:31:09 PM By: Alric Quan Entered By: Alric Quan on 01/18/2017 08:26:59 Inclan, Franny R. ([id number]) -------------------------------------------------------------------------------- [place] Details Patient Name: Posa, Lorita R. Date of Service: 01/18/2017 8:00 AM Medical Record Number: [id number] Patient Account Number: [id number] Date of Birth/Sex: 01/07/1961 (56 y.o. Female) Treating RN: Carolyne Fiscal, Debi Primary Care Mikko Lewellen: Tracie Harrier Other Clinician: Referring Tersea Aulds: Tracie Harrier Treating Rydge Texidor/Extender: Melburn Hake, HOYT Weeks in Treatment: 8 Active Inactive ` Abuse / Safety / Falls / Self Care Management Nursing Diagnoses: History of Falls Potential for falls Goals: Patient will not experience any injury related to falls Date Initiated: 11/23/2016 Target Resolution Date: 03/24/2017 Goal Status: Active Interventions: Assess Activities of Daily Living upon admission and as needed Assess fall risk on admission and as needed Assess: immobility, friction, shearing, incontinence upon admission and as needed Notes: ` Nutrition Nursing Diagnoses: Imbalanced nutrition Potential for alteratiion in Nutrition/Potential for imbalanced nutrition Goals: Patient/caregiver agrees to and verbalizes understanding of need to use nutritional supplements and/or vitamins as prescribed Date Initiated:  11/23/2016 Target Resolution Date: 03/24/2017 Goal Status: Active Interventions: Assess patient nutrition upon admission and as needed per policy Notes: ` Orientation to the Wound Care Program Nursing Diagnoses: Knowledge deficit related to the wound healing center program Goals: Patient/caregiver will verbalize understanding of the [place], [place]. ([id number]) Date Initiated: 11/23/2016 Target Resolution Date: 12/23/2016 Goal Status: Active Interventions: Provide education on orientation to the wound center Notes: ` Pain, Acute or Chronic Nursing Diagnoses: Pain, acute or chronic: actual or potential Potential alteration in comfort, pain Goals: Patient/caregiver will verbalize adequate pain control between visits Date Initiated: 11/23/2016 Target Resolution Date: 02/24/2017 Goal Status: Active Interventions: Complete pain assessment as per visit requirements Notes: ` Wound/Skin Impairment Nursing Diagnoses: Impaired tissue integrity Knowledge deficit related to smoking impact on wound healing Knowledge deficit related to ulceration/compromised skin integrity Goals: Ulcer/skin breakdown will have a volume reduction of 80% by week 12 Date Initiated: 11/23/2016 Target Resolution Date: 03/24/2017 Goal Status: Active Interventions: Assess patient/caregiver ability to perform ulcer/skin care regimen upon admission and as needed Assess ulceration(s) every visit Provide education on smoking Notes: Electronic Signature(s) Signed: 01/18/2017 4:31:09 PM By: Alric Quan Entered By: Alric Quan on 01/18/2017 08:26:51 Pressnell, Skylen R. ([id number]) -------------------------------------------------------------------------------- Pain Assessment Details Patient Name: Domino, Emiline R. Date of Service: 01/18/2017 8:00 AM Medical Record Number: [id number] Patient Account Number: [id number] Date of Birth/Sex: May 13, 1960 (56 y.o. Female) Treating RN:  Carolyne Fiscal Debi Primary Care Zinia Innocent: Tracie Harrier Other Clinician: Referring Derika Eckles: Tracie Harrier Treating Brodi Nery/Extender: Melburn Hake, HOYT Weeks in Treatment: 8 Active Problems Location of Pain Severity and Description of Pain Patient Has Paino No Site Locations Pain Management and Medication Current Pain Management: Electronic Signature(s) Signed: 01/18/2017 3:19:17 PM By: Alric Quan Entered By: Alric Quan on 01/18/2017 15:19:16 Geffre, Dilyn R. ([id number]) -------------------------------------------------------------------------------- Patient/Caregiver Education Details Patient Name: Amon, Gladie R. Date of Service: 01/18/2017 8:00 AM Medical Record Number: [id number] Patient Account Number: [id number] Date of Birth/Gender: 1960/04/20 (57 y.o. Female) Treating RN: Carolyne Fiscal, Debi Primary Care Physician: Tracie Harrier Other Clinician: Referring Physician: Tracie Harrier Treating Physician/Extender: Sharalyn Ink in Treatment: 8 Education Assessment Education Provided To: Patient Education Topics Provided Wound/Skin Impairment: Handouts: Caring for Your Ulcer, Other: change dressing as ordered Methods: Demonstration, Explain/Verbal Responses: State content correctly Electronic Signature(s) Signed: 01/18/2017 4:31:09 PM By: Alric Quan Entered By: Alric Quan on 01/18/2017 08:31:43 Tineo, Ivis R. ([id number]) -------------------------------------------------------------------------------- Wound Assessment Details Patient Name: Maimone, Atarah R. Date of Service: 01/18/2017 8:00 AM Medical Record Number: [id number] Patient Account Number: [id number] Date of Birth/Sex: 03-Feb-1960 (56 y.o. Female) Treating RN: Carolyne Fiscal, Debi Primary Care Bayley Hurn: Tracie Harrier Other Clinician: Referring Omarrion Carmer: Tracie Harrier Treating Terril Chestnut/Extender: Melburn Hake, HOYT Weeks in Treatment: 8 Wound Status Wound Number: 1 Primary  Etiology: Arterial Insufficiency Ulcer Wound Location: Left Lower Leg - Lateral Secondary Trauma, Other Etiology: Wounding Event: Trauma Wound Status: Open Date Acquired: 10/26/2016 Comorbid History: Asthma, Sleep Apnea, Hypertension, Weeks Of Treatment: 8 Osteoarthritis Clustered Wound: No Photos Photo Uploaded By: Alric Quan on 01/18/2017 15:25:54 Wound Measurements Length: (cm) 0.2 Width: (cm) 0.2 Depth: (cm) 0.1 Area: (cm) 0.031 Volume: (cm) 0.003 % Reduction in Area: 99.6% % Reduction in Volume: 99.6% Epithelialization: None Tunneling: No Undermining: No Wound Description Classification: Partial Thickness Wound Margin: Distinct, outline attached Exudate Amount: Large Exudate Type: Serous Exudate Color: amber Foul Odor After Cleansing: No Slough/Fibrino Yes Wound Bed Granulation Amount: Large (67-100%) Granulation Quality: Red Necrotic Amount: Small (1-33%) Necrotic Quality: Adherent Slough Periwound Skin Texture Texture Color No Abnormalities Noted: No No Abnormalities Noted: No Scarring: Yes Erythema: Yes Gearhart, Tarsha R. ([id number]) Moisture Erythema Location: Circumferential No Abnormalities Noted: No Temperature / Pain Maceration: No Temperature: No Abnormality Tenderness on Palpation: Yes Wound Preparation Ulcer Cleansing: Rinsed/Irrigated with Saline Topical Anesthetic Applied: Other: lidocaine 4%, Treatment Notes Wound #1 (Left, Lateral Lower Leg) 1. Cleansed with: Clean wound with Normal Saline 2. Anesthetic Topical Lidocaine 4% cream to wound bed prior to debridement 4. Dressing Applied: Prisma Ag 5. Secondary [facility] Signature(s) Signed: 01/18/2017 4:31:09 PM By: Alric Quan Entered By: Alric Quan on 01/18/2017 08:14:49 Borbon, Enza R. ([id number]) -------------------------------------------------------------------------------- Wound Assessment Details Patient Name: Kuan, Markeeta  R. Date of Service: 01/18/2017 8:00 AM Medical Record Number: [id number] Patient Account Number: [id number] Date of Birth/Sex: [birth year] (56 y.o. Female) Treating RN: Carolyne Fiscal, Debi Primary Care Nazaiah Navarrete: Tracie Harrier Other Clinician: Referring Yanissa Michalsky: Tracie Harrier Treating Brenden Rudman/Extender: Melburn Hake, HOYT Weeks in Treatment: 8 Wound Status Wound Number: 2 Primary Trauma, Other Etiology: Wound Location: Left Lower Leg - Medial Wound Status: Open Wounding Event: Trauma Comorbid Asthma, Sleep Apnea, Hypertension, Date Acquired: 12/16/2016 History: Osteoarthritis Weeks Of Treatment: 4 Clustered Wound: No Photos Photo Uploaded By: Alric Quan on 01/18/2017 15:26:25 Wound Measurements Length: (cm) 0.4 Width: (cm) 0.6 Depth: (cm) 0.1 Area: (cm) 0.188 Volume: (cm) 0.019 % Reduction in Area: 51.2% % Reduction in Volume: 50% Epithelialization: None Tunneling: No Undermining: No  Wound Description Classification: Partial Thickness Wound Margin: Flat and Intact Exudate Amount: Large Exudate Type: Serous Exudate Color: amber Foul Odor After Cleansing: No Slough/Fibrino Yes Wound Bed Granulation Amount: Medium (34-66%) Exposed Structure Necrotic Amount: Medium (34-66%) Fascia Exposed: No Necrotic Quality: Adherent Slough Fat Layer (Subcutaneous Tissue) Exposed: No Tendon Exposed: No Muscle Exposed: No Joint Exposed: No Bone Exposed: No Periwound Skin Texture Meiners, Jermiya R. ([id number]) Texture Color No Abnormalities Noted: No No Abnormalities Noted: No Erythema: Yes Moisture Erythema Location: Circumferential No Abnormalities Noted: No Maceration: Yes Temperature / Pain Temperature: No Abnormality Tenderness on Palpation: Yes Wound Preparation Ulcer Cleansing: Rinsed/Irrigated with Saline Topical Anesthetic Applied: Other: lidocaine 4%, Treatment Notes Wound #2 (Left, Medial Lower Leg) 1. Cleansed with: Clean wound with Normal Saline 2.  Anesthetic Topical Lidocaine 4% cream to wound bed prior to debridement 4. Dressing Applied: Prisma Ag 5. Secondary [place] Signature(s) Signed: 01/18/2017 4:31:09 PM By: Alric Quan Entered By: Alric Quan on 01/18/2017 08:15:26 Antonelli, Latonga R. ([id number]) -------------------------------------------------------------------------------- [place] Details Patient Name: Devenport, Rhegan R. Date of Service: 01/18/2017 8:00 AM Medical Record Number: [id number] Patient Account Number: [id number] Date of Birth/Sex: Oct 31, 1960 (56 y.o. Female) Treating RN: Carolyne Fiscal, Debi Primary Care Narada Uzzle: Tracie Harrier Other Clinician: Referring Cataldo Cosgriff: Tracie Harrier Treating Arush Gatliff/Extender: Melburn Hake, HOYT Weeks in Treatment: 8 Vital Signs Time Taken: 08:18 Temperature (F): 97.6 Height (in): 65 Pulse (bpm): 64 Weight (lbs): 294.4 Respiratory Rate (breaths/min): 20 Body Mass Index (BMI): 49 Blood Pressure (mmHg): 142/64 Reference Range: 80 - 120 mg / dl Electronic Signature(s) Signed: 01/18/2017 4:31:09 PM By: Alric Quan Entered By: Alric Quan on 01/18/2017 08:22:05

## 2017-01-19 NOTE — Progress Notes (Signed)
[name] ([id number]) Visit Report for 01/18/2017 Chief Complaint Document Details Patient Name: [name], [name] Date of Service: 01/18/2017 8:00 AM Medical Record Number: [id number] Patient Account Number: [id number] Date of Birth/Sex: 03/23/1960 (57 y.o. Female) Treating RN: Carolyne Fiscal, Debi Primary Care Provider: Tracie Harrier Other Clinician: Referring Provider: Tracie Harrier Treating Provider/Extender: Melburn Hake, HOYT Weeks in Treatment: 8 Information Obtained from: Patient Chief Complaint She is here in follow up evaluation of left lower extremity ulcers Electronic Signature(s) Signed: 01/18/2017 6:11:24 PM By: Worthy Keeler PA-C Entered By: Worthy Keeler on 01/18/2017 08:23:54 Keilman, Kyarra R. ([id number]) -------------------------------------------------------------------------------- HPI Details Patient Name: [name], [name] Date of Service: 01/18/2017 8:00 AM Medical Record Number: [id number] Patient Account Number: [id number] Date of Birth/Sex: Jan 06, 1961 (57 y.o. Female) Treating RN: Carolyne Fiscal, Debi Primary Care Provider: Tracie Harrier Other Clinician: Referring Provider: Tracie Harrier Treating Provider/Extender: Melburn Hake, HOYT Weeks in Treatment: 8 History of Present Illness Location: Patient presents with a wound to left lower leg. Quality: Patient reports experiencing a sharp pain to affected area(s). Severity: Patient states wound are getting worse. Duration: Patient has had the wound for < 4 weeks prior to presenting for treatment Timing: Pain in wound is Intermittent (comes and goes Context: The wound occurred when the patient had a laceration with a sharp object Modifying Factors: Other treatment(s) tried include:suturing done in the ER and sutures removed on 2 weeks Associated Signs and Symptoms: Patient reports having increase discharge. HPI Description: 57 year old patient seen by her PCP Dr. Ginette Pitman, for a follow-up of her left leg lacerated  wound had sutures removed a week before she was seen on 11/15/2016. there was swelling and erythema and the edges of the laceration had not completely healed and she had completed a course of doxycycline. She had quit smoking about a year ago. Past medical history significant for anxiety states, asthma, endometriosis, GERD, obesity, sleep apnea, status post appendectomy, cardiac catheterization, cholecystectomy, multiple colonoscopies and EGDs, hysterectomy, knee arthroscopies. she recently restarted smoking after quitting for over 20 years 12/21/2016 -- she now presents with a new wound to the left lower third of her leg where her dog bit her and caused a puncture wound which is now been draining and has some necrotic debris. She was seen by her PCP and he has put her on doxycycline orally. She has given up smoking this last week. 12/28/2016 -- she is using her Santyl ointment for the new wound on the lower leg and has been draining significantly. 01/04/17- she is here in follow-up evaluation for left lower extremity wounds. She is voicing no complaints or concerns. Wounds have improved in appearance, will add compression therapy 01/18/17 on evaluation today patient appears to be doing pretty well in regard to her left lower extremity wounds. With that being said she tells me that the wraps that were placed last week really did not seem to improve things as far as her wounds are concerned and in fact she unfortunately got this wet it also started falling down probably due to the fact that it got wet. I told her that I would definitely revised her remove the compression wrap if it became wet. Nonetheless she tells me that overall she really was not happy with the compression and would prefer not to go back to that again. Electronic Signature(s) Signed: 01/18/2017 6:11:24 PM By: Worthy Keeler PA-C Entered By: Worthy Keeler on 01/18/2017 09:32:33 Fodge, Betzaira R.  ([id number]) -------------------------------------------------------------------------------- Physical Exam Details Patient Name: [name], [name]  Date of Service: 01/18/2017 8:00 AM Medical Record Number: [id number] Patient Account Number: [id number] Date of Birth/Sex: [birth year] (57 y.o. Female) Treating RN: Carolyne Fiscal, Debi Primary Care Provider: Tracie Harrier Other Clinician: Referring Provider: Tracie Harrier Treating Provider/Extender: STONE III, HOYT Weeks in Treatment: 8 Constitutional Obese and well-hydrated in no acute distress. Respiratory normal breathing without difficulty. clear to auscultation bilaterally. Cardiovascular regular rate and rhythm with normal S1, S2. 1+ pitting edema of the bilateral lower extremities. Psychiatric this patient is able to make decisions and demonstrates good insight into disease process. Alert and Oriented x 3. pleasant and cooperative. Notes Patient has no slough covering her wounds that cannot be cleaned off just with saline and gauze. She tolerated this without any significant discomfort which is good news. Electronic Signature(s) Signed: 01/18/2017 6:11:24 PM By: Worthy Keeler PA-C Entered By: Worthy Keeler on 01/18/2017 09:33:25 Bowler, Elany R. ([id number]) -------------------------------------------------------------------------------- Physician Orders Details Patient Name: [name], [name] Date of Service: 01/18/2017 8:00 AM Medical Record Number: [id number] Patient Account Number: [id number] Date of Birth/Sex: 1960-02-22 (57 y.o. Female) Treating RN: Carolyne Fiscal, Debi Primary Care Provider: Tracie Harrier Other Clinician: Referring Provider: Tracie Harrier Treating Provider/Extender: Melburn Hake, HOYT Weeks in Treatment: 8 Verbal / Phone Orders: Yes Clinician: Carolyne Fiscal, Debi Read Back and Verified: Yes Diagnosis Coding ICD-10 Coding Code Description L97.222 Non-pressure 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  exposed S81.812A Laceration without foreign body, left lower leg, initial encounter E66.01 Morbid (severe) obesity due to excess calories F17.218 Nicotine dependence, cigarettes, with other nicotine-induced disorders S81.852A Open bite, left lower leg, initial encounter 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d Wound Cleansing Wound #1 Left,Lateral Lower Leg o Clean wound with Normal Saline. o Cleanse wound with mild soap and water o May Shower, gently pat wound dry prior to applying new dressing. o May shower with protection. Wound #2 Left,Medial Lower Leg o Clean wound with Normal Saline. o Cleanse wound with mild soap and water o May Shower, gently pat wound dry prior to applying new dressing. o May shower with protection. Anesthetic (add to Medication List) Wound #1 Left,Lateral Lower Leg o Topical Lidocaine 4% cream applied to wound bed prior to debridement (In Clinic Only). Wound #2 Left,Medial Lower Leg o Topical Lidocaine 4% cream applied to wound bed prior to debridement (In Clinic Only). Primary Wound Dressing Wound #1 Left,Lateral Lower Leg o Prisma Ag Wound #2 Left,Medial Lower Leg o Prisma Ag Secondary Dressing Wound #1 Left,Lateral Lower Leg o [place] Wound #2 Left,Medial Lower [place] ([id number]) Dressing Change Frequency Wound #1 Left,Lateral Lower Leg o Change dressing every other day. Wound #2 Left,Medial Lower Leg o Change dressing every other day. Follow-up Appointments o Return Appointment in 1 week. Edema Control Wound #1 Left,Lateral Lower Leg o Elevate legs to the level of the heart and pump ankles as often as possible Wound #2 Left,Medial Lower Leg o Elevate legs to the level of the heart and pump ankles as often as possible Additional Orders / Instructions Wound #1 Left,Lateral Lower Leg o Stop Smoking - continue to o Increase protein  intake. Wound #2 Left,Medial Lower Leg o Stop Smoking - continue to o Increase protein intake. Medications-please add to medication list. Wound #1 Left,Lateral Lower Leg o Other: - Vitamin C, Vitamin A, Zinc Wound #2 Left,Medial Lower Leg o Other: - Vitamin C, Vitamin A, Zinc Patient Medications Allergies: Ceftin, myocin, penicillin, sulfur, Darvocet-N, Percocet, red dye, shell fish,  Iodinated Contrast- Oral and IV Dye, Phenergan, saccharin, Aminoglycosides, latex, influenza vaccines, Cefotan, cefuroxime Notifications Medication Indication Start End lidocaine DOSE 1 - topical 4 % cream - 1 cream topical Electronic Signature(s) Signed: 01/18/2017 4:31:09 PM By: Alric Quan Signed: 01/18/2017 6:11:24 PM By: Worthy Keeler PA-C Entered By: Alric Quan on 01/18/2017 08:28:17 Thien, Marquite R. ([id number]) -------------------------------------------------------------------------------- Prescription 01/18/2017 Patient Name: Ketter, Zaylia R. Provider: Worthy Keeler PA-C Date of Birth: 05-09-1960 NPI#: [id number] Sex: F DEA#: [id number] Phone #: [phone] License #: Patient Address: [place], [place] [address] [address], [place],  [address] [phone] Allergies Ceftin myocin penicillin sulfur Darvocet-N Percocet red dye shell fish Iodinated Contrast- Oral and IV Dye Phenergan saccharin Aminoglycosides latex influenza vaccines Cefotan cefuroxime Medine, Foster R. ([id number]) Medication Medication: Route: Strength: Form: lidocaine topical 4% cream Class: TOPICAL LOCAL ANESTHETICS Dose: Frequency / Time: Indication: 1 1 cream topical Number of Refills: Number of Units: 0 Generic Substitution: Start Date: End Date: Administered at [place]: No Note to Pharmacy: Signature(s): Date(s): Electronic  Signature(s) Signed: 01/18/2017 4:31:09 PM By: Alric Quan Signed: 01/18/2017 6:11:24 PM By: Worthy Keeler PA-C Entered By: Alric Quan on 01/18/2017 08:28:17 Radigan, Hilde R. ([id number]) --------------------------------------------------------------------------------  Problem List Details Patient Name: Rheaume, Kimia R. Date of Service: 01/18/2017 8:00 AM Medical Record Number: [id number] Patient Account Number: [id number] Date of Birth/Sex: February 27, 1960 (57 y.o. Female) Treating RN: Carolyne Fiscal, Debi Primary Care Provider: Tracie Harrier Other Clinician: Referring Provider: Tracie Harrier Treating Provider/Extender: Melburn Hake, HOYT Weeks in Treatment: 8 Active Problems ICD-10 Encounter Code Description Active Date Diagnosis 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d 11/23/2016 Yes S81.812A Laceration without foreign body, left lower leg, initial encounter 11/23/2016 Yes E66.01 Morbid (severe) obesity due to excess calories 11/23/2016 Yes F17.218 Nicotine dependence, cigarettes, with other nicotine-induced 11/23/2016 Yes disorders S81.852A Open bite, left lower leg, initial encounter 12/21/2016 Yes 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d 12/21/2016 Yes Inactive Problems Resolved Problems Electronic Signature(s) Signed: 01/18/2017 6:11:24 PM By: Worthy Keeler PA-C Entered By: Worthy Keeler on 01/18/2017 08:23:44 Villamor, Faithlyn R. ([id number]) -------------------------------------------------------------------------------- Progress Note Details Patient Name: Friscia, Valor R. Date of Service: 01/18/2017 8:00 AM Medical Record Number: [id number] Patient Account Number: [id number] Date of Birth/Sex: Jul 24, 1960 (57 y.o. Female) Treating RN: Carolyne Fiscal, Debi Primary Care Provider: Tracie Harrier Other Clinician: Referring Provider: Tracie Harrier Treating Provider/Extender: Melburn Hake, HOYT Weeks in Treatment:  8 Subjective Chief Complaint Information obtained from Patient She is here in follow up evaluation of left lower extremity ulcers History of Present Illness (HPI) The following HPI elements were documented for the patient's wound: Location: Patient presents with a wound to left lower leg. Quality: Patient reports experiencing a sharp pain to affected area(s). Severity: Patient states wound are getting worse. Duration: Patient has had the wound for < 4 weeks prior to presenting for treatment Timing: Pain in wound is Intermittent (comes and goes Context: The wound occurred when the patient had a laceration with a sharp object Modifying Factors: Other treatment(s) tried include:suturing done in the ER and sutures removed on 2 weeks Associated Signs and Symptoms: Patient reports having increase discharge. 57 year old patient seen by her PCP Dr. Ginette Pitman, for a follow-up of her left leg lacerated wound had sutures removed a week before she was seen on 11/15/2016. there was swelling and erythema and the edges of the laceration had  not completely healed and she had completed a course of doxycycline. She had quit smoking about a year ago. Past medical history significant for anxiety states, asthma, endometriosis, GERD, obesity, sleep apnea, status post appendectomy, cardiac catheterization, cholecystectomy, multiple colonoscopies and EGDs, hysterectomy, knee arthroscopies. she recently restarted smoking after quitting for over 20 years 12/21/2016 -- she now presents with a new wound to the left lower third of her leg where her dog bit her and caused a puncture wound which is now been draining and has some necrotic debris. She was seen by her PCP and he has put her on doxycycline orally. She has given up smoking this last week. 12/28/2016 -- she is using her Santyl ointment for the new wound on the lower leg and has been draining significantly. 01/04/17- she is here in follow-up evaluation for left  lower extremity wounds. She is voicing no complaints or concerns. Wounds have improved in appearance, will add compression therapy 01/18/17 on evaluation today patient appears to be doing pretty well in regard to her left lower extremity wounds. With that being said she tells me that the wraps that were placed last week really did not seem to improve things as far as her wounds are concerned and in fact she unfortunately got this wet it also started falling down probably due to the fact that it got wet. I told her that I would definitely revised her remove the compression wrap if it became wet. Nonetheless she tells me that overall she really was not happy with the compression and would prefer not to go back to that again. Patient History Information obtained from Patient. Family History Cancer - Father, Diabetes - Mother,Siblings, Heart Disease - Mother,Siblings, Hypertension - Mother,Father,Siblings, Lung Disease - Mother,Father,Siblings, Stroke - Mother,Father, Thyroid Problems - Father,Siblings, No family history of Hereditary Spherocytosis, Kidney Disease, Seizures, Tuberculosis. Garms, Ikram R. ([id number]) Social History Current every day smoker - 3 cigarettes a day, Marital Status - Married, Alcohol Use - Never, Drug Use - No History, Caffeine Use - Daily. Review of Systems (ROS) Constitutional Symptoms (General Health) Denies complaints or symptoms of Fever, Chills. Respiratory The patient has no complaints or symptoms. Cardiovascular Complains or has symptoms of LE edema. Psychiatric The patient has no complaints or symptoms. Objective Constitutional Obese and well-hydrated in no acute distress. Vitals Time Taken: 8:18 AM, Height: 65 in, Weight: 294.4 lbs, BMI: 49, Temperature: 97.6 F, Pulse: 64 bpm, Respiratory Rate: 20 breaths/min, Blood Pressure: 142/64 mmHg. Respiratory normal breathing without difficulty. clear to auscultation bilaterally. Cardiovascular regular rate  and rhythm with normal S1, S2. 1+ pitting edema of the bilateral lower extremities. Psychiatric this patient is able to make decisions and demonstrates good insight into disease process. Alert and Oriented x 3. pleasant and cooperative. General Notes: Patient has no slough covering her wounds that cannot be cleaned off just with saline and gauze. She tolerated this without any significant discomfort which is good news. Integumentary (Hair, Skin) Wound #1 status is Open. Original cause of wound was Trauma. The wound is located on the Left,Lateral Lower Leg. The wound measures 0.2cm length x 0.2cm width x 0.1cm depth; 0.031cm^2 area and 0.003cm^3 volume. There is no tunneling or undermining noted. There is a large amount of serous drainage noted. The wound margin is distinct with the outline attached to the wound base. There is large (67-100%) red granulation within the wound bed. There is a small (1-33%) amount of necrotic tissue within the wound bed including Adherent Slough. The periwound  skin appearance exhibited: Scarring, Erythema. The periwound skin appearance did not exhibit: Maceration. The surrounding wound skin color is noted with erythema which is circumferential. Periwound temperature was noted as No Abnormality. The periwound has tenderness on palpation. Wound #2 status is Open. Original cause of wound was Trauma. The wound is located on the Left,Medial Lower Leg. The wound measures 0.4cm length x 0.6cm width x 0.1cm depth; 0.188cm^2 area and 0.019cm^3 volume. There is no tunneling or undermining noted. There is a large amount of serous drainage noted. The wound margin is flat and intact. There is medium (34-66%) granulation within the wound bed. There is a medium (34-66%) amount of necrotic tissue within the wound Nylen, Nemesis R. ([id number]) bed including Adherent Slough. The periwound skin appearance exhibited: Maceration, Erythema. The surrounding wound skin color is noted with  erythema which is circumferential. Periwound temperature was noted as No Abnormality. The periwound has tenderness on palpation. Assessment Active Problems ICD-10 L97.222 - Non-pressure 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 exposed S81.812A - Laceration without foreign body, left lower leg, initial encounter E66.01 - Morbid (severe) obesity due to excess calories F17.218 - Nicotine dependence, cigarettes, with other nicotine-induced disorders S81.852A - Open bite, left lower leg, initial encounter E83.151 - Non-pressure 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 exposed Plan Wound Cleansing: Wound #1 Left,Lateral Lower Leg: Clean wound with Normal Saline. Cleanse wound with mild soap and water May Shower, gently pat wound dry prior to applying new dressing. May shower with protection. Wound #2 Left,Medial Lower Leg: Clean wound with Normal Saline. Cleanse wound with mild soap and water May Shower, gently pat wound dry prior to applying new dressing. May shower with protection. Anesthetic (add to Medication List): Wound #1 Left,Lateral Lower Leg: Topical Lidocaine 4% cream applied to wound bed prior to debridement (In Clinic Only). Wound #2 Left,Medial Lower Leg: Topical Lidocaine 4% cream applied to wound bed prior to debridement (In Clinic Only). Primary Wound Dressing: Wound #1 Left,Lateral Lower Leg: Prisma Ag Wound #2 Left,Medial Lower Leg: Prisma Ag Secondary Dressing: Wound #1 Left,Lateral Lower Leg: [place] Wound #2 Left,Medial Lower Leg: [place] Dressing Change Frequency: Wound #1 Left,Lateral Lower Leg: Change dressing every other day. Wound #2 Left,Medial Lower Leg: Change dressing every other day. Follow-up Appointments: Rogene, Meth Khyler R. ([id number]) Return Appointment in 1 week. Edema Control: Wound #1 Left,Lateral Lower Leg: Elevate legs to the level of the heart and pump ankles as often as possible Wound #2 Left,Medial Lower Leg: Elevate  legs to the level of the heart and pump ankles as often as possible Additional Orders / Instructions: Wound #1 Left,Lateral Lower Leg: Stop Smoking - continue to Increase protein intake. Wound #2 Left,Medial Lower Leg: Stop Smoking - continue to Increase protein intake. Medications-please add to medication list.: Wound #1 Left,Lateral Lower Leg: Other: - Vitamin C, Vitamin A, Zinc Wound #2 Left,Medial Lower Leg: Other: - Vitamin C, Vitamin A, Zinc The following medication(s) was prescribed: lidocaine topical 4 % cream 1 1 cream topical I am going to recommend that we continue with the Current wound care measures except for the discontinuation of the compression wraps for the next week. We will see were things stand following. Please see above for specific wound care orders. We will see patient for re-evaluation in 1 week(s) here in the clinic. If anything worsens or changes patient will contact our office for additional recommendations. Electronic Signature(s) Signed: 01/18/2017 6:11:24 PM By: Worthy Keeler PA-C Entered By: Melburn Hake,  Hoyt on 01/18/2017 09:33:57 Opdahl, Jakyiah R. ([id number]) -------------------------------------------------------------------------------- ROS/PFSH Details Patient Name: Shambaugh, Theadora R. Date of Service: 01/18/2017 8:00 AM Medical Record Number: [id number] Patient Account Number: [id number] Date of Birth/Sex: December 27, 1960 (57 y.o. Female) Treating RN: Carolyne Fiscal, Debi Primary Care Provider: Tracie Harrier Other Clinician: Referring Provider: Tracie Harrier Treating Provider/Extender: Melburn Hake, HOYT Weeks in Treatment: 8 Information Obtained From Patient Wound History Do you currently have one or more open woundso Yes How many open wounds do you currently haveo 1 Approximately how long have you had your woundso 4 weeks How have you been treating your wound(s) until nowo saline and neosporin Has your wound(s) ever healed and then re-openedo No Have  you had any lab work done in the past montho No Have you tested positive for an antibiotic resistant organism (MRSA, VRE)o No Have you tested positive for osteomyelitis (bone infection)o No Have you had any tests for circulation on your legso Yes Who ordered the testo dr. dew [facility] the test [facility] Have you had other problems associated with your woundso Swelling Constitutional Symptoms (General Health) Complaints and Symptoms: Negative for: Fever; Chills Cardiovascular Complaints and Symptoms: Positive for: LE edema Medical History: Positive for: Hypertension Respiratory Complaints and Symptoms: No Complaints or Symptoms Medical History: Positive for: Asthma; Sleep Apnea Musculoskeletal Medical History: Positive for: Osteoarthritis Psychiatric Complaints and Symptoms: No Complaints or Symptoms Immunizations Tolan, Crescent R. ([id number]) Pneumococcal Vaccine: Received Pneumococcal Vaccination: Yes Implantable Devices Family and Social History Cancer: Yes - Father; Diabetes: Yes - Mother,Siblings; Heart Disease: Yes - Mother,Siblings; Hereditary Spherocytosis: No; Hypertension: Yes - Mother,Father,Siblings; Kidney Disease: No; Lung Disease: Yes - Mother,Father,Siblings; Seizures: No; Stroke: Yes - Mother,Father; Thyroid Problems: Yes - Father,Siblings; Tuberculosis: No; Current every day smoker - 3 cigarettes a day; Marital Status - Married; Alcohol Use: Never; Drug Use: No History; Caffeine Use: Daily; Financial Concerns: No; Food, Clothing or Shelter Needs: No; Support System Lacking: No; Transportation Concerns: No; Advanced Directives: No; Patient does not want information on Advanced Directives; Do not resuscitate: No; Living Will: No; Medical Power of Attorney: No Physician Affirmation I have reviewed and agree with the above information. Electronic Signature(s) Signed: 01/18/2017 4:31:09 PM By: Alric Quan Signed: 01/18/2017 6:11:24 PM By: Worthy Keeler  PA-C Entered By: Worthy Keeler on 01/18/2017 09:32:55 Dierolf, Dejha R. ([id number]) -------------------------------------------------------------------------------- SuperBill Details Patient Name: Madani, Teela R. Date of Service: 01/18/2017 Medical Record Number: [id number] Patient Account Number: [id number] Date of Birth/Sex: 07-Dec-1960 (57 y.o. Female) Treating RN: Carolyne Fiscal, Debi Primary Care Provider: Tracie Harrier Other Clinician: Referring Provider: Tracie Harrier Treating Provider/Extender: Melburn Hake, HOYT Weeks in Treatment: 8 Diagnosis Coding ICD-10 Codes Code Description [phone] Non-pressure 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 exposed S81.812A Laceration without foreign body, left lower leg, initial encounter E66.01 Morbid (severe) obesity due to excess calories F17.218 Nicotine dependence, cigarettes, with other nicotine-induced disorders S81.852A Open bite, left lower leg, initial encounter 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d Facility Procedures CPT4 Code: [id number] Description: 99213 - WOUND CARE VISIT-LEV 3 EST PT Modifier: Quantity: 1 Physician Procedures CPT4 Code: [id number] Description: 62376 - WC PHYS LEVEL 3 - EST PT ICD-10 Diagnosis Description 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

## 2017-01-25 ENCOUNTER — Encounter: Payer: Medicare Other | Admitting: Physician Assistant

## 2017-01-25 DIAGNOSIS — L97222 Non-pressure 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 exposed: Secondary | ICD-10-CM | POA: Diagnosis not present

## 2017-01-27 NOTE — Progress Notes (Signed)
[name] ([id number]) Visit Report for 01/25/2017 Arrival Information Details Patient Name: [name], [name] Date of Service: 01/25/2017 2:15 PM Medical Record Number: [id number] Patient Account Number: [id number] Date of Birth/Sex: 1960-04-04 (57 y.o. Female) Treating RN: Carolyne Fiscal, Debi Primary Care Kayra Crowell: Tracie Harrier Other Clinician: Referring Romelia Bromell: Tracie Harrier Treating Baer Hinton/Extender: Melburn Hake, HOYT Weeks in Treatment: 9 Visit Information History Since Last Visit All ordered tests and consults were completed: No Patient Arrived: Ambulatory Added or deleted any medications: No Arrival Time: 14:21 Any new allergies or adverse reactions: No Accompanied By: self Had a fall or experienced change in No Transfer Assistance: None activities of daily living that may affect Patient Identification Verified: Yes risk of falls: Secondary Verification Process Completed: Yes Signs or symptoms of abuse/neglect since last visito No Patient Requires Transmission-Based No Hospitalized since last visit: No Precautions: Has Dressing in Place as Prescribed: Yes Patient Has Alerts: No Pain Present Now: No Electronic Signature(s) Signed: 01/25/2017 4:57:34 PM By: Alric Quan Entered By: Alric Quan on 01/25/2017 14:21:25 [name], [name] ([id number]) -------------------------------------------------------------------------------- Encounter Discharge Information Details Patient Name: [name], [name] Date of Service: 01/25/2017 2:15 PM Medical Record Number: [id number] Patient Account Number: [id number] Date of Birth/Sex: 05/13/1960 (56 y.o. Female) Treating RN: Carolyne Fiscal, Debi Primary Care Delrico Minehart: Tracie Harrier Other Clinician: Referring Hermenia Fritcher: Tracie Harrier Treating Aric Jost/Extender: Melburn Hake, HOYT Weeks in Treatment: 9 Encounter Discharge Information Items Discharge Pain Level: 0 Discharge Condition: Stable Ambulatory Status:  Ambulatory Discharge Destination: Home Private Transportation: Auto Accompanied By: self Schedule Follow-up Appointment: Yes Medication Reconciliation completed and provided No to Patient/Care Chidiebere Wynn: Clinical Summary of Care: Electronic Signature(s) Signed: 01/25/2017 3:50:28 PM By: Alric Quan Entered By: Alric Quan on 01/25/2017 15:50:28 [name], [name] ([id number]) -------------------------------------------------------------------------------- Lower Extremity Assessment Details Patient Name: [name], [name] Date of Service: 01/25/2017 2:15 PM Medical Record Number: [id number] Patient Account Number: [id number] Date of Birth/Sex: 22-Apr-1960 (57 y.o. Female) Treating RN: Carolyne Fiscal, Debi Primary Care Rachael Zapanta: Tracie Harrier Other Clinician: Referring Nashika Coker: Tracie Harrier Treating Kassidy Frankson/Extender: Melburn Hake, HOYT Weeks in Treatment: 9 Vascular Assessment Pulses: Dorsalis Pedis Palpable: [Left:Yes] Posterior Tibial Extremity colors, hair growth, and conditions: Extremity Color: [Left:Mottled] Temperature of Extremity: [Left:Warm] Capillary Refill: [Left:< 3 seconds] Toe Nail Assessment Left: Right: Thick: No Discolored: No Deformed: No Improper Length and Hygiene: No Electronic Signature(s) Signed: 01/25/2017 4:57:34 PM By: Alric Quan Entered By: Alric Quan on 01/25/2017 14:30:54 [name], [name] ([id number]) -------------------------------------------------------------------------------- Multi Wound Chart Details Patient Name: [name], [name] Date of Service: 01/25/2017 2:15 PM Medical Record Number: [id number] Patient Account Number: [id number] Date of Birth/Sex: 30-Dec-1960 (57 y.o. Female) Treating RN: Carolyne Fiscal, Debi Primary Care Catia Todorov: Tracie Harrier Other Clinician: Referring Kapono Luhn: Tracie Harrier Treating Leopoldo Mazzie/Extender: Melburn Hake, HOYT Weeks in Treatment: 9 Vital Signs Height(in): 65 Pulse(bpm):  65 Weight(lbs): 294.4 Blood Pressure(mmHg): 134/65 Body Mass Index(BMI): 49 Temperature(F): 98.6 Respiratory Rate 20 (breaths/min): Photos: [1:No Photos] [2:No Photos] [N/A:N/A] Wound Location: [1:Left Lower Leg - Lateral] [2:Left Lower Leg - Medial] [N/A:N/A] Wounding Event: [1:Trauma] [2:Trauma] [N/A:N/A] Primary Etiology: [1:Arterial Insufficiency Ulcer] [2:Trauma, Other] [N/A:N/A] Secondary Etiology: [1:Trauma, Other] [2:N/A] [N/A:N/A] Comorbid History: [1:Asthma, Sleep Apnea, Hypertension, Osteoarthritis] [2:Asthma, Sleep Apnea, Hypertension, Osteoarthritis] [N/A:N/A] Date Acquired: [1:10/26/2016] [2:12/16/2016] [N/A:N/A] Weeks of Treatment: [1:9] [2:5] [N/A:N/A] Wound Status: [1:Open] [2:Open] [N/A:N/A] Measurements L x W x D [1:0.2x0.1x0.1] [2:0.3x0.6x0.1] [N/A:N/A] (cm) Area (cm) : [1:0.016] [2:0.141] [N/A:N/A] Volume (cm) : [1:0.002] [2:0.014] [N/A:N/A] % Reduction in Area: [1:99.80%] [2:63.40%] [N/A:N/A] % Reduction in Volume: [1:99.70%] [2:63.20%] [N/A:N/A] Classification: [1:Partial Thickness] [2:Partial Thickness] [  N/A:N/A] Exudate Amount: [1:Large] [2:Large] [N/A:N/A] Exudate Type: [1:Serous] [2:Serous] [N/A:N/A] Exudate Color: [1:amber] [2:amber] [N/A:N/A] Wound Margin: [1:Distinct, outline attached] [2:Flat and Intact] [N/A:N/A] Granulation Amount: [1:Large (67-100%)] [2:Medium (34-66%)] [N/A:N/A] Granulation Quality: [1:Red] [2:N/A] [N/A:N/A] Necrotic Amount: [1:None Present (0%)] [2:Medium (34-66%)] [N/A:N/A] Epithelialization: [1:Medium (34-66%)] [2:None] [N/A:N/A] Periwound Skin Texture: [1:Scarring: Yes] [2:No Abnormalities Noted] [N/A:N/A] Periwound Skin Moisture: [1:Maceration: No] [2:Maceration: Yes] [N/A:N/A] Periwound Skin Color: [1:Erythema: Yes] [2:Erythema: Yes] [N/A:N/A] Erythema Location: [1:Circumferential] [2:Circumferential] [N/A:N/A] Temperature: [1:No Abnormality] [2:No Abnormality] [N/A:N/A] Tenderness on Palpation: [1:Yes] [2:Yes]  [N/A:N/A] Wound Preparation: [1:Ulcer Cleansing: Rinsed/Irrigated with Saline] [2:Ulcer Cleansing: Rinsed/Irrigated with Saline] [N/A:N/A] Topical Anesthetic Applied: Topical Anesthetic Applied: Other: lidocaine 4% Other: lidocaine 4% Teegarden, Calen R. ([id number]) Treatment Notes Electronic Signature(s) Signed: 01/25/2017 4:57:34 PM By: Alric Quan Entered By: Alric Quan on 01/25/2017 14:31:38 Mckeough, Antonio R. ([id number]) -------------------------------------------------------------------------------- [place] Details Patient Name: [name], [name] Date of Service: 01/25/2017 2:15 PM Medical Record Number: [id number] Patient Account Number: [id number] Date of Birth/Sex: 29-Apr-1960 (57 y.o. Female) Treating RN: Carolyne Fiscal, Debi Primary Care Lititia Sen: Tracie Harrier Other Clinician: Referring Nikira Kushnir: Tracie Harrier Treating Somaya Grassi/Extender: Melburn Hake, HOYT Weeks in Treatment: 9 Active Inactive ` Abuse / Safety / Falls / Self Care Management Nursing Diagnoses: History of Falls Potential for falls Goals: Patient will not experience any injury related to falls Date Initiated: 11/23/2016 Target Resolution Date: 03/24/2017 Goal Status: Active Interventions: Assess Activities of Daily Living upon admission and as needed Assess fall risk on admission and as needed Assess: immobility, friction, shearing, incontinence upon admission and as needed Notes: ` Nutrition Nursing Diagnoses: Imbalanced nutrition Potential for alteratiion in Nutrition/Potential for imbalanced nutrition Goals: Patient/caregiver agrees to and verbalizes understanding of need to use nutritional supplements and/or vitamins as prescribed Date Initiated: 11/23/2016 Target Resolution Date: 03/24/2017 Goal Status: Active Interventions: Assess patient nutrition upon admission and as needed per policy Notes: ` Orientation to the Wound Care Program Nursing Diagnoses: Knowledge  deficit related to the wound healing center program Goals: Patient/caregiver will verbalize understanding of the [place], [place]. ([id number]) Date Initiated: 11/23/2016 Target Resolution Date: 12/23/2016 Goal Status: Active Interventions: Provide education on orientation to the wound center Notes: ` Pain, Acute or Chronic Nursing Diagnoses: Pain, acute or chronic: actual or potential Potential alteration in comfort, pain Goals: Patient/caregiver will verbalize adequate pain control between visits Date Initiated: 11/23/2016 Target Resolution Date: 02/24/2017 Goal Status: Active Interventions: Complete pain assessment as per visit requirements Notes: ` Wound/Skin Impairment Nursing Diagnoses: Impaired tissue integrity Knowledge deficit related to smoking impact on wound healing Knowledge deficit related to ulceration/compromised skin integrity Goals: Ulcer/skin breakdown will have a volume reduction of 80% by week 12 Date Initiated: 11/23/2016 Target Resolution Date: 03/24/2017 Goal Status: Active Interventions: Assess patient/caregiver ability to perform ulcer/skin care regimen upon admission and as needed Assess ulceration(s) every visit Provide education on smoking Notes: Electronic Signature(s) Signed: 01/25/2017 4:57:34 PM By: Alric Quan Entered By: Alric Quan on 01/25/2017 14:31:00 Sandridge, Makinsley R. ([id number]) -------------------------------------------------------------------------------- Pain Assessment Details Patient Name: [name], [name] Date of Service: 01/25/2017 2:15 PM Medical Record Number: [id number] Patient Account Number: [id number] Date of Birth/Sex: [birth year] (57 y.o. Female) Treating RN: Carolyne Fiscal, Debi Primary Care Samreen Seltzer: Tracie Harrier Other Clinician: Referring Lexys Milliner: Tracie Harrier Treating Hamdan Toscano/Extender: Melburn Hake, HOYT Weeks in Treatment: 9 Active Problems Location of Pain Severity and  Description of Pain Patient Has Paino No Site Locations Pain Management and Medication Current Pain Management: Electronic Signature(s) Signed: 01/25/2017 4:57:34 PM By: Alric Quan  Entered By: Alric Quan on 01/25/2017 14:21:30 Brinson, Nadeen R. ([id number]) -------------------------------------------------------------------------------- Patient/Caregiver Education Details Patient Name: [name], [name] Date of Service: 01/25/2017 2:15 PM Medical Record Number: [id number] Patient Account Number: [id number] Date of Birth/Gender: 1960/11/18 (57 y.o. Female) Treating RN: Carolyne Fiscal, Debi Primary Care Physician: Tracie Harrier Other Clinician: Referring Physician: Tracie Harrier Treating Physician/Extender: Sharalyn Ink in Treatment: 9 Education Assessment Education Provided To: Patient Education Topics Provided Wound/Skin Impairment: Handouts: Caring for Your Ulcer, Other: change dressing as ordered Methods: Demonstration, Explain/Verbal Responses: State content correctly Electronic Signature(s) Signed: 01/25/2017 4:57:34 PM By: Alric Quan Entered By: Alric Quan on 01/25/2017 15:50:44 [name], [name] ([id number]) -------------------------------------------------------------------------------- Wound Assessment Details Patient Name: [name], [name] Date of Service: 01/25/2017 2:15 PM Medical Record Number: [id number] Patient Account Number: [id number] Date of Birth/Sex: 1960/05/10 (57 y.o. Female) Treating RN: Carolyne Fiscal, Debi Primary Care Le Faulcon: Tracie Harrier Other Clinician: Referring Sonny Anthes: Tracie Harrier Treating Jalaiyah Throgmorton/Extender: Melburn Hake, HOYT Weeks in Treatment: 9 Wound Status Wound Number: 1 Primary Etiology: Arterial Insufficiency Ulcer Wound Location: Left Lower Leg - Lateral Secondary Trauma, Other Etiology: Wounding Event: Trauma Wound Status: Open Date Acquired: 10/26/2016 Comorbid History: Asthma, Sleep Apnea,  Hypertension, Weeks Of Treatment: 9 Osteoarthritis Clustered Wound: No Photos Photo Uploaded By: Alric Quan on 01/25/2017 16:53:30 Wound Measurements Length: (cm) 0.2 Width: (cm) 0.1 Depth: (cm) 0.1 Area: (cm) 0.016 Volume: (cm) 0.002 % Reduction in Area: 99.8% % Reduction in Volume: 99.7% Epithelialization: Medium (34-66%) Tunneling: No Undermining: No Wound Description Classification: Partial Thickness Fou Wound Margin: Distinct, outline attached Slo Exudate Amount: Large Exudate Type: Serous Exudate Color: amber l Odor After Cleansing: No ugh/Fibrino Yes Wound Bed Granulation Amount: Large (67-100%) Granulation Quality: Red Necrotic Amount: None Present (0%) Periwound Skin Texture Texture Color No Abnormalities Noted: No No Abnormalities Noted: No Scarring: Yes Erythema: Yes Erythema Location: Circumferential Moisture [name], [name] ([id number]) No Abnormalities Noted: No Temperature / Pain Maceration: No Temperature: No Abnormality Tenderness on Palpation: Yes Wound Preparation Ulcer Cleansing: Rinsed/Irrigated with Saline Topical Anesthetic Applied: Other: lidocaine 4%, Treatment Notes Wound #1 (Left, Lateral Lower Leg) 1. Cleansed with: Clean wound with Normal Saline 2. Anesthetic Topical Lidocaine 4% cream to wound bed prior to debridement 4. Dressing Applied: Other dressing (specify in notes) 5. Secondary [place] Notes Endoform Electronic Signature(s) Signed: 01/25/2017 4:57:34 PM By: Alric Quan Entered By: Alric Quan on 01/25/2017 14:30:09 [name], [name] ([id number]) -------------------------------------------------------------------------------- Wound Assessment Details Patient Name: [name], [name] Date of Service: 01/25/2017 2:15 PM Medical Record Number: [id number] Patient Account Number: [id number] Date of Birth/Sex: [birth year] (57 y.o. Female) Treating RN: Carolyne Fiscal, Debi Primary Care  Alazae Crymes: Tracie Harrier Other Clinician: Referring Ski Polich: Tracie Harrier Treating Yelina Sarratt/Extender: Melburn Hake, HOYT Weeks in Treatment: 9 Wound Status Wound Number: 2 Primary Trauma, Other Etiology: Wound Location: Left, Medial Lower Leg Wound Status: Open Wounding Event: Trauma Comorbid Asthma, Sleep Apnea, Hypertension, Date Acquired: 12/16/2016 History: Osteoarthritis Weeks Of Treatment: 5 Clustered Wound: No Photos Photo Uploaded By: Alric Quan on 01/25/2017 16:53:16 Wound Measurements Length: (cm) 0.3 Width: (cm) 0.2 Depth: (cm) 0.1 Area: (cm) 0.047 Volume: (cm) 0.005 % Reduction in Area: 87.8% % Reduction in Volume: 86.8% Epithelialization: None Tunneling: No Undermining: No Wound Description Classification: Partial Thickness Wound Margin: Flat and Intact Exudate Amount: Large Exudate Type: Serous Exudate Color: amber Foul Odor After Cleansing: No Slough/Fibrino Yes Wound Bed Granulation Amount: Medium (34-66%) Exposed Structure Necrotic Amount: Medium (34-66%) Fascia Exposed: No Necrotic Quality: Adherent Slough Fat Layer (Subcutaneous Tissue) Exposed:  No Tendon Exposed: No Muscle Exposed: No Joint Exposed: No Bone Exposed: No Periwound Skin Texture [name], [name] ([id number]) Texture Color No Abnormalities Noted: No No Abnormalities Noted: No Erythema: Yes Moisture Erythema Location: Circumferential No Abnormalities Noted: No Maceration: Yes Temperature / Pain Temperature: No Abnormality Tenderness on Palpation: Yes Wound Preparation Ulcer Cleansing: Rinsed/Irrigated with Saline Topical Anesthetic Applied: Other: lidocaine 4%, Treatment Notes Wound #2 (Left, Medial Lower Leg) 1. Cleansed with: Clean wound with Normal Saline 2. Anesthetic Topical Lidocaine 4% cream to wound bed prior to debridement 4. Dressing Applied: Other dressing (specify in notes) 5. Secondary [place] Notes Endoform Electronic Signature(s) Signed: 01/25/2017 4:57:34 PM By: Alric Quan Entered By: Alric Quan on 01/25/2017 14:57:00 Tigert, Marlayna R. ([id number]) -------------------------------------------------------------------------------- [place] Details Patient Name: [name], Birda R. Date of Service: 01/25/2017 2:15 PM Medical Record Number: [id number] Patient Account Number: [id number] Date of Birth/Sex: 1960-03-21 (57 y.o. Female) Treating RN: Carolyne Fiscal, Debi Primary Care Shirelle Tootle: Tracie Harrier Other Clinician: Referring Jayliani Wanner: Tracie Harrier Treating Kayna Suppa/Extender: Melburn Hake, HOYT Weeks in Treatment: 9 Vital Signs Time Taken: 14:23 Temperature (F): 98.6 Height (in): 65 Pulse (bpm): 73 Weight (lbs): 294.4 Respiratory Rate (breaths/min): 20 Body Mass Index (BMI): 49 Blood Pressure (mmHg): 134/65 Reference Range: 80 - 120 mg / dl Electronic Signature(s) Signed: 01/25/2017 4:57:34 PM By: Alric Quan Entered By: Alric Quan on 01/25/2017 14:23:50

## 2017-01-28 NOTE — Progress Notes (Signed)
[name] ([id number]) Visit Report for 01/25/2017 Chief Complaint Document Details Patient Name: [name], [name] Date of Service: 01/25/2017 2:15 PM Medical Record Number: [id number] Patient Account Number: [id number] Date of Birth/Sex: [birth year] (57 y.o. Female) Treating RN: Carolyne Fiscal, Debi Primary Care Provider: Tracie Harrier Other Clinician: Referring Provider: Tracie Harrier Treating Provider/Extender: Melburn Hake, HOYT Weeks in Treatment: 9 Information Obtained from: Patient Chief Complaint She is here in follow up evaluation of left lower extremity ulcers Electronic Signature(s) Signed: 01/26/2017 8:05:24 AM By: Worthy Keeler PA-C Entered By: Worthy Keeler on 01/25/2017 14:45:37 Hagerty, Sonika R. ([id number]) -------------------------------------------------------------------------------- Debridement Details Patient Name: [name], [name] Date of Service: 01/25/2017 2:15 PM Medical Record Number: [id number] Patient Account Number: [id number] Date of Birth/Sex: Aug 10, 1960 (57 y.o. Female) Treating RN: Carolyne Fiscal, Debi Primary Care Provider: Tracie Harrier Other Clinician: Referring Provider: Tracie Harrier Treating Provider/Extender: Melburn Hake, HOYT Weeks in Treatment: 9 Debridement Performed for Wound #2 Left,Medial Lower Leg Assessment: Performed By: Physician STONE III, HOYT E., PA-C Debridement: Debridement Pre-procedure Verification/Time Yes - 14:49 Out Taken: Start Time: 14:50 Pain Control: Lidocaine 4% Topical Solution Level: Skin/Subcutaneous Tissue Total Area Debrided (L x W): 0.2 (cm) x 0.3 (cm) = 0.06 (cm) Tissue and other material Viable, Non-Viable, Exudate, Fibrin/Slough, Subcutaneous debrided: Instrument: Curette Bleeding: Minimum Hemostasis Achieved: Pressure End Time: 14:52 Procedural Pain: 0 Post Procedural Pain: 0 Response to Treatment: Procedure was tolerated well Post Debridement Measurements of Total Wound Length: (cm)  0.3 Width: (cm) 0.2 Depth: (cm) 0.2 Volume: (cm) 0.009 Character of Wound/Ulcer Post Debridement: Requires Further Debridement Post Procedure Diagnosis Same as Pre-procedure Electronic Signature(s) Signed: 01/25/2017 4:57:34 PM By: Alric Quan Signed: 01/26/2017 8:05:24 AM By: Worthy Keeler PA-C Entered By: Alric Quan on 01/25/2017 14:51:23 Torosian, Rhyanna R. ([id number]) -------------------------------------------------------------------------------- HPI Details Patient Name: [name], [name] Date of Service: 01/25/2017 2:15 PM Medical Record Number: [id number] Patient Account Number: [id number] Date of Birth/Sex: September 27, 1960 (57 y.o. Female) Treating RN: Carolyne Fiscal, Debi Primary Care Provider: Tracie Harrier Other Clinician: Referring Provider: Tracie Harrier Treating Provider/Extender: Melburn Hake, HOYT Weeks in Treatment: 9 History of Present Illness Location: Patient presents with a wound to left lower leg. Quality: Patient reports experiencing a sharp pain to affected area(s). Severity: Patient states wound are getting worse. Duration: Patient has had the wound for < 4 weeks prior to presenting for treatment Timing: Pain in wound is Intermittent (comes and goes Context: The wound occurred when the patient had a laceration with a sharp object Modifying Factors: Other treatment(s) tried include:suturing done in the ER and sutures removed on 2 weeks Associated Signs and Symptoms: Patient reports having increase discharge. HPI Description: 57 year old patient seen by her PCP Dr. Ginette Pitman, for a follow-up of her left leg lacerated wound had sutures removed a week before she was seen on 11/15/2016. there was swelling and erythema and the edges of the laceration had not completely healed and she had completed a course of doxycycline. She had quit smoking about a year ago. Past medical history significant for anxiety states, asthma, endometriosis, GERD, obesity, sleep apnea,  status post appendectomy, cardiac catheterization, cholecystectomy, multiple colonoscopies and EGDs, hysterectomy, knee arthroscopies. she recently restarted smoking after quitting for over 20 years 12/21/2016 -- she now presents with a new wound to the left lower third of her leg where her dog bit her and caused a puncture wound which is now been draining and has some necrotic debris. She was seen by her PCP and he has put her on doxycycline orally.  She has given up smoking this last week. 12/28/2016 -- she is using her Santyl ointment for the new wound on the lower leg and has been draining significantly. 01/04/17- she is here in follow-up evaluation for left lower extremity wounds. She is voicing no complaints or concerns. Wounds have improved in appearance, will add compression therapy 01/25/17 on evaluation today patient appears to be doing fairly well in regard to her left medial ankle and left anterior thigh ulcers. Both are very small at this point. There is no evidence of infection and patient is not having any significant discomfort. Electronic Signature(s) Signed: 01/26/2017 8:05:24 AM By: Worthy Keeler PA-C Entered By: Worthy Keeler on 01/25/2017 17:13:14 Turbyfill, Nakina R. ([id number]) -------------------------------------------------------------------------------- Physical Exam Details Patient Name: [name], [name] Date of Service: 01/25/2017 2:15 PM Medical Record Number: [id number] Patient Account Number: [id number] Date of Birth/Sex: 1960-01-21 (57 y.o. Female) Treating RN: Carolyne Fiscal, Debi Primary Care Provider: Tracie Harrier Other Clinician: Referring Provider: Tracie Harrier Treating Provider/Extender: STONE III, HOYT Weeks in Treatment: 9 Constitutional Obese and well-hydrated in no acute distress. Respiratory normal breathing without difficulty. Psychiatric this patient is able to make decisions and demonstrates good insight into disease process. Alert and  Oriented x 3. pleasant and cooperative. Notes Patient's wound bed on the medial left ankle did have some Slough noted although there is a lot of epithelialization as well. This appears to be doing excellent. The slough was removed with sharp debridement and patient tolerated this without pain. Her wound on the anterior lower extremity is actually doing very well and there's no evidence of infection there is a slight hole with some undermining. No debridement required. Electronic Signature(s) Signed: 01/26/2017 8:05:24 AM By: Worthy Keeler PA-C Entered By: Worthy Keeler on 01/25/2017 17:14:28 Kooyman, Clydene R. ([id number]) -------------------------------------------------------------------------------- Physician Orders Details Patient Name: Rozell, Gabrianna R. Date of Service: 01/25/2017 2:15 PM Medical Record Number: [id number] Patient Account Number: [id number] Date of Birth/Sex: [birth year] (57 y.o. Female) Treating RN: Carolyne Fiscal, Debi Primary Care Provider: Tracie Harrier Other Clinician: Referring Provider: Tracie Harrier Treating Provider/Extender: Melburn Hake, HOYT Weeks in Treatment: 9 Verbal / Phone Orders: Yes Clinician: Carolyne Fiscal, Debi Read Back and Verified: Yes Diagnosis Coding ICD-10 Coding Code Description 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d S81.812A Laceration without foreign body, left lower leg, initial encounter E66.01 Morbid (severe) obesity due to excess calories F17.218 Nicotine dependence, cigarettes, with other nicotine-induced disorders S81.852A Open bite, left lower leg, initial encounter 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d Wound Cleansing Wound #1 Left,Lateral Lower Leg o Clean wound with Normal Saline. o Cleanse wound with mild soap and water o May Shower, gently pat wound dry prior to applying new dressing. o May shower with protection. Wound #2 Left,Medial Lower Leg o  Clean wound with Normal Saline. o Cleanse wound with mild soap and water o May Shower, gently pat wound dry prior to applying new dressing. o May shower with protection. Anesthetic (add to Medication List) Wound #1 Left,Lateral Lower Leg o Topical Lidocaine 4% cream applied to wound bed prior to debridement (In Clinic Only). Wound #2 Left,Medial Lower Leg o Topical Lidocaine 4% cream applied to wound bed prior to debridement (In Clinic Only). Primary Wound Dressing Wound #1 Left,Lateral Lower Leg o Other: - Endoform Wound #2 Left,Medial Lower Leg o Other: - Endoform Secondary Dressing Wound #1 Left,Lateral Lower Leg o [place] Wound #2 Left,Medial Lower [place]. (  [id number]) Dressing Change Frequency Wound #1 Left,Lateral Lower Leg o Change dressing every other day. Wound #2 Left,Medial Lower Leg o Change dressing every other day. Follow-up Appointments o Return Appointment in 1 week. Edema Control Wound #1 Left,Lateral Lower Leg o Elevate legs to the level of the heart and pump ankles as often as possible Wound #2 Left,Medial Lower Leg o Elevate legs to the level of the heart and pump ankles as often as possible Additional Orders / Instructions Wound #1 Left,Lateral Lower Leg o Stop Smoking - continue to o Increase protein intake. Wound #2 Left,Medial Lower Leg o Stop Smoking - continue to o Increase protein intake. Medications-please add to medication list. Wound #1 Left,Lateral Lower Leg o Other: - Vitamin C, Vitamin A, Zinc Purchase Mederma over the counter Wound #2 Left,Medial Lower Leg o Other: - Vitamin C, Vitamin A, Zinc Purchase Mederma over the counter Patient Medications Allergies: Ceftin, myocin, penicillin, sulfur, Darvocet-N, Percocet, red dye, shell fish, Iodinated Contrast- Oral and IV Dye, Phenergan, saccharin, Aminoglycosides, latex, influenza vaccines, Cefotan,  cefuroxime Notifications Medication Indication Start End lidocaine DOSE 1 - topical 4 % cream - 1 cream topical Electronic Signature(s) Signed: 01/25/2017 4:57:34 PM By: Alric Quan Signed: 01/26/2017 8:05:24 AM By: Worthy Keeler PA-C Entered By: Alric Quan on 01/25/2017 14:54:58 Tsan, Madyn R. ([id number]) -------------------------------------------------------------------------------- Prescription 01/25/2017 Patient Name: Rabbani, Analys R. Provider: Worthy Keeler PA-C Date of Birth: [birth year] NPI#: [id number] Sex: F DEA#: [id number] Phone #: [phone] License #: Patient Address: [place], [place] [address] [address], [place], [place] [address] [phone] Allergies Ceftin myocin penicillin sulfur Darvocet-N Percocet red dye shell fish Iodinated Contrast- Oral and IV Dye Phenergan saccharin Aminoglycosides latex influenza vaccines Cefotan cefuroxime Lacina, Shamekia R. ([id number]) Medication Medication: Route: Strength: Form: lidocaine topical 4% cream Class: TOPICAL LOCAL ANESTHETICS Dose: Frequency / Time: Indication: 1 1 cream topical Number of Refills: Number of Units: 0 Generic Substitution: Start Date: End Date: Administered at [place]: Yes Time Administered: Time Discontinued: Note to Pharmacy: Signature(s): Date(s): Electronic Signature(s) Signed: 01/25/2017 4:57:34 PM By: Alric Quan Signed: 01/26/2017 8:05:24 AM By: Worthy Keeler PA-C Entered By: Alric Quan on 01/25/2017 14:55:00 Laible, Shaterica R. ([id number]) --------------------------------------------------------------------------------  Problem List Details Patient Name: Abundis, Horace R. Date of Service: 01/25/2017 2:15 PM Medical Record Number: [id number] Patient Account Number: [id number] Date of Birth/Sex: 10-13-1960 (57 y.o.  Female) Treating RN: Carolyne Fiscal, Debi Primary Care Provider: Tracie Harrier Other Clinician: Referring Provider: Tracie Harrier Treating Provider/Extender: Melburn Hake, HOYT Weeks in Treatment: 9 Active Problems ICD-10 Encounter Code Description Active Date Diagnosis 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d 11/23/2016 Yes S81.812A Laceration without foreign body, left lower leg, initial encounter 11/23/2016 Yes E66.01 Morbid (severe) obesity due to excess calories 11/23/2016 Yes F17.218 Nicotine dependence, cigarettes, with other nicotine-induced 11/23/2016 Yes disorders S81.852A Open bite, left lower leg, initial encounter 12/21/2016 Yes 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d 12/21/2016 Yes Inactive Problems Resolved Problems Electronic Signature(s) Signed: 01/26/2017 8:05:24 AM By: Worthy Keeler PA-C Entered By: Worthy Keeler on 01/25/2017 17:12:11 Brassell, Quianna R. ([id number]) -------------------------------------------------------------------------------- Progress Note Details Patient Name: Koskela, Archie R. Date of Service: 01/25/2017 2:15 PM Medical Record Number: [id number] Patient Account Number: [id number] Date of Birth/Sex: 1960/04/10 (57 y.o. Female) Treating RN: Carolyne Fiscal, Debi Primary Care Provider: Tracie Harrier Other Clinician: Referring Provider: Tracie Harrier Treating Provider/Extender: Joaquim Lai  III, HOYT Weeks in Treatment: 9 Subjective Chief Complaint Information obtained from Patient She is here in follow up evaluation of left lower extremity ulcers History of Present Illness (HPI) The following HPI elements were documented for the patient's wound: Location: Patient presents with a wound to left lower leg. Quality: Patient reports experiencing a sharp pain to affected area(s). Severity: Patient states wound are getting worse. Duration: Patient has had the wound for < 4 weeks prior to  presenting for treatment Timing: Pain in wound is Intermittent (comes and goes Context: The wound occurred when the patient had a laceration with a sharp object Modifying Factors: Other treatment(s) tried include:suturing done in the ER and sutures removed on 2 weeks Associated Signs and Symptoms: Patient reports having increase discharge. 57 year old patient seen by her PCP Dr. Ginette Pitman, for a follow-up of her left leg lacerated wound had sutures removed a week before she was seen on 11/15/2016. there was swelling and erythema and the edges of the laceration had not completely healed and she had completed a course of doxycycline. She had quit smoking about a year ago. Past medical history significant for anxiety states, asthma, endometriosis, GERD, obesity, sleep apnea, status post appendectomy, cardiac catheterization, cholecystectomy, multiple colonoscopies and EGDs, hysterectomy, knee arthroscopies. she recently restarted smoking after quitting for over 20 years 12/21/2016 -- she now presents with a new wound to the left lower third of her leg where her dog bit her and caused a puncture wound which is now been draining and has some necrotic debris. She was seen by her PCP and he has put her on doxycycline orally. She has given up smoking this last week. 12/28/2016 -- she is using her Santyl ointment for the new wound on the lower leg and has been draining significantly. 01/04/17- she is here in follow-up evaluation for left lower extremity wounds. She is voicing no complaints or concerns. Wounds have improved in appearance, will add compression therapy 01/25/17 on evaluation today patient appears to be doing fairly well in regard to her left medial ankle and left anterior thigh ulcers. Both are very small at this point. There is no evidence of infection and patient is not having any significant discomfort. Patient History Information obtained from Patient. Family History Cancer - Father,  Diabetes - Mother,Siblings, Heart Disease - Mother,Siblings, Hypertension - Mother,Father,Siblings, Lung Disease - Mother,Father,Siblings, Stroke - Mother,Father, Thyroid Problems - Father,Siblings, No family history of Hereditary Spherocytosis, Kidney Disease, Seizures, Tuberculosis. Social History Current every day smoker - 3 cigarettes a day, Marital Status - Married, Alcohol Use - Never, Drug Use - No History, Caffeine Use - Daily. Riesen, Glen R. ([id number]) Review of Systems (ROS) Constitutional Symptoms (General Health) Denies complaints or symptoms of Fever, Chills. Respiratory The patient has no complaints or symptoms. Cardiovascular Complains or has symptoms of LE edema. Psychiatric The patient has no complaints or symptoms. Objective Constitutional Obese and well-hydrated in no acute distress. Vitals Time Taken: 2:23 PM, Height: 65 in, Weight: 294.4 lbs, BMI: 49, Temperature: 98.6 F, Pulse: 73 bpm, Respiratory Rate: 20 breaths/min, Blood Pressure: 134/65 mmHg. Respiratory normal breathing without difficulty. Psychiatric this patient is able to make decisions and demonstrates good insight into disease process. Alert and Oriented x 3. pleasant and cooperative. General Notes: Patient's wound bed on the medial left ankle did have some Slough noted although there is a lot of epithelialization as well. This appears to be doing excellent. The slough was removed with sharp debridement and patient tolerated this without pain. Her  wound on the anterior lower extremity is actually doing very well and there's no evidence of infection there is a slight hole with some undermining. No debridement required. Integumentary (Hair, Skin) Wound #1 status is Open. Original cause of wound was Trauma. The wound is located on the Left,Lateral Lower Leg. The wound measures 0.2cm length x 0.1cm width x 0.1cm depth; 0.016cm^2 area and 0.002cm^3 volume. There is no tunneling or undermining noted.  There is a large amount of serous drainage noted. The wound margin is distinct with the outline attached to the wound base. There is large (67-100%) red granulation within the wound bed. There is no necrotic tissue within the wound bed. The periwound skin appearance exhibited: Scarring, Erythema. The periwound skin appearance did not exhibit: Maceration. The surrounding wound skin color is noted with erythema which is circumferential. Periwound temperature was noted as No Abnormality. The periwound has tenderness on palpation. Wound #2 status is Open. Original cause of wound was Trauma. The wound is located on the Left,Medial Lower Leg. The wound measures 0.3cm length x 0.2cm width x 0.1cm depth; 0.047cm^2 area and 0.005cm^3 volume. There is no tunneling or undermining noted. There is a large amount of serous drainage noted. The wound margin is flat and intact. There is medium (34-66%) granulation within the wound bed. There is a medium (34-66%) amount of necrotic tissue within the wound bed including Adherent Slough. The periwound skin appearance exhibited: Maceration, Erythema. The surrounding wound skin color is noted with erythema which is circumferential. Periwound temperature was noted as No Abnormality. The periwound has tenderness on palpation. Johnston, Teyanna R. ([id number]) Assessment Active Problems ICD-10 L97.222 - Non-pressure 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 exposed G64.403K - Laceration without foreign body, left lower leg, initial encounter E66.01 - Morbid (severe) obesity due to excess calories F17.218 - Nicotine dependence, cigarettes, with other nicotine-induced disorders S81.852A - Open bite, left lower leg, initial encounter V42.595 - Non-pressure 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 exposed Procedures Wound #2 Pre-procedure diagnosis of Wound #2 is a Trauma, Other located on the Left,Medial Lower Leg . There was a Skin/Subcutaneous Tissue Debridement  ([id number]) debridement with total area of 0.06 sq cm performed by STONE III, HOYT E., PA-C. with the following instrument(s): Curette to remove Viable and Non-Viable tissue/material including Exudate, Fibrin/Slough, and Subcutaneous after achieving pain control using Lidocaine 4% Topical Solution. A time out was conducted at 14:49, prior to the start of the procedure. A Minimum amount of bleeding was controlled with Pressure. The procedure was tolerated well with a pain level of 0 throughout and a pain level of 0 following the procedure. Post Debridement Measurements: 0.3cm length x 0.2cm width x 0.2cm depth; 0.009cm^3 volume. Character of Wound/Ulcer Post Debridement requires further debridement. Post procedure Diagnosis Wound #2: Same as Pre-Procedure Plan Wound Cleansing: Wound #1 Left,Lateral Lower Leg: Clean wound with Normal Saline. Cleanse wound with mild soap and water May Shower, gently pat wound dry prior to applying new dressing. May shower with protection. Wound #2 Left,Medial Lower Leg: Clean wound with Normal Saline. Cleanse wound with mild soap and water May Shower, gently pat wound dry prior to applying new dressing. May shower with protection. Anesthetic (add to Medication List): Wound #1 Left,Lateral Lower Leg: Topical Lidocaine 4% cream applied to wound bed prior to debridement (In Clinic Only). Wound #2 Left,Medial Lower Leg: Topical Lidocaine 4% cream applied to wound bed prior to debridement (In Clinic Only). Primary Wound Dressing: Wound #1 Left,Lateral Lower Leg:  Other: - Endoform Zawadzki, Briellah R. ([id number]) Wound #2 Left,Medial Lower Leg: Other: - Endoform Secondary Dressing: Wound #1 Left,Lateral Lower Leg: [place] Wound #2 Left,Medial Lower Leg: [place] Dressing Change Frequency: Wound #1 Left,Lateral Lower Leg: Change dressing every other day. Wound #2 Left,Medial Lower Leg: Change dressing every other day. Follow-up  Appointments: Return Appointment in 1 week. Edema Control: Wound #1 Left,Lateral Lower Leg: Elevate legs to the level of the heart and pump ankles as often as possible Wound #2 Left,Medial Lower Leg: Elevate legs to the level of the heart and pump ankles as often as possible Additional Orders / Instructions: Wound #1 Left,Lateral Lower Leg: Stop Smoking - continue to Increase protein intake. Wound #2 Left,Medial Lower Leg: Stop Smoking - continue to Increase protein intake. Medications-please add to medication list.: Wound #1 Left,Lateral Lower Leg: Other: - Vitamin C, Vitamin A, Zinc Purchase Mederma over the counter Wound #2 Left,Medial Lower Leg: Other: - Vitamin C, Vitamin A, Zinc Purchase Mederma over the counter The following medication(s) was prescribed: lidocaine topical 4 % cream 1 1 cream topical was prescribed at facility At this point I'm gonna recommend that we switch to Endoform for both wounds at this time. Patient is in agreement with the plan. We will see were things stand in one weeks time. If anything worsens significantly she will contact the office for suggestions. Please see above for specific wound care orders. We will see patient for re-evaluation in 1 week(s) here in the clinic. If anything worsens or changes patient will contact our office for additional recommendations. Electronic Signature(s) Signed: 01/26/2017 8:05:24 AM By: Worthy Keeler PA-C Entered By: Worthy Keeler on 01/25/2017 17:15:08 Glascoe, Kay R. ([id number]) -------------------------------------------------------------------------------- ROS/PFSH Details Patient Name: Ulbricht, Maily R. Date of Service: 01/25/2017 2:15 PM Medical Record Number: [id number] Patient Account Number: [id number] Date of Birth/Sex: 11/06/1960 (57 y.o. Female) Treating RN: Carolyne Fiscal, Debi Primary Care Provider: Tracie Harrier Other Clinician: Referring Provider: Tracie Harrier Treating Provider/Extender:  Melburn Hake, HOYT Weeks in Treatment: 9 Information Obtained From Patient Wound History Do you currently have one or more open woundso Yes How many open wounds do you currently haveo 1 Approximately how long have you had your woundso 4 weeks How have you been treating your wound(s) until nowo saline and neosporin Has your wound(s) ever healed and then re-openedo No Have you had any lab work done in the past montho No Have you tested positive for an antibiotic resistant organism (MRSA, VRE)o No Have you tested positive for osteomyelitis (bone infection)o No Have you had any tests for circulation on your legso Yes Who ordered the testo dr. dew [facility] the test [facility] Have you had other problems associated with your woundso Swelling Constitutional Symptoms (General Health) Complaints and Symptoms: Negative for: Fever; Chills Cardiovascular Complaints and Symptoms: Positive for: LE edema Medical History: Positive for: Hypertension Respiratory Complaints and Symptoms: No Complaints or Symptoms Medical History: Positive for: Asthma; Sleep Apnea Musculoskeletal Medical History: Positive for: Osteoarthritis Psychiatric Complaints and Symptoms: No Complaints or Symptoms Immunizations Laing, Shenoa R. ([id number]) Pneumococcal Vaccine: Received Pneumococcal Vaccination: Yes Implantable Devices Family and Social History Cancer: Yes - Father; Diabetes: Yes - Mother,Siblings; Heart Disease: Yes - Mother,Siblings; Hereditary Spherocytosis: No; Hypertension: Yes - Mother,Father,Siblings; Kidney Disease: No; Lung Disease: Yes - Mother,Father,Siblings; Seizures: No; Stroke: Yes - Mother,Father; Thyroid Problems: Yes - Father,Siblings; Tuberculosis: No; Current every day smoker - 3 cigarettes a day; Marital Status - Married; Alcohol Use: Never; Drug Use: No History;  Caffeine Use: Daily; Financial Concerns: No; Food, Clothing or Shelter Needs: No; Support System Lacking: No;  Transportation Concerns: No; Advanced Directives: No; Patient does not want information on Advanced Directives; Do not resuscitate: No; Living Will: No; Medical Power of Attorney: No Physician Affirmation I have reviewed and agree with the above information. Electronic Signature(s) Signed: 01/26/2017 8:05:24 AM By: Worthy Keeler PA-C Signed: 01/26/2017 4:08:09 PM By: Alric Quan Entered By: Worthy Keeler on 01/25/2017 17:13:35 Thorpe, Yasmen R. ([id number]) -------------------------------------------------------------------------------- SuperBill Details Patient Name: Roan, Saliah R. Date of Service: 01/25/2017 Medical Record Number: [id number] Patient Account Number: [id number] Date of Birth/Sex: [birth year] (57 y.o. Female) Treating RN: Carolyne Fiscal, Debi Primary Care Provider: Tracie Harrier Other Clinician: Referring Provider: Tracie Harrier Treating Provider/Extender: Melburn Hake, HOYT Weeks in Treatment: 9 Diagnosis Coding ICD-10 Codes Code Description [phone] Non-pressure 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 exposed S81.812A Laceration without foreign body, left lower leg, initial encounter E66.01 Morbid (severe) obesity due to excess calories F17.218 Nicotine dependence, cigarettes, with other nicotine-induced disorders S81.852A Open bite, left lower leg, initial encounter 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d Facility Procedures CPT4 Code: 64383818 Description: 40375 - DEB SUBQ TISSUE 20 SQ CM/< ICD-10 Diagnosis Description L97.222 Non-pressure 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 expo Modifier: sed Quantity: 1 Physician Procedures CPT4 Code: 4360677 Description: 03403 - WC PHYS SUBQ TISS 20 SQ CM ICD-10 Diagnosis Description L97.222 Non-pressure 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 expo Modifier: sed Quantity: 1 Electronic Signature(s) Signed: 01/26/2017 8:05:24 AM By: Worthy Keeler PA-C Entered By: Worthy Keeler on 01/25/2017 17:16:02

## 2017-02-02 ENCOUNTER — Ambulatory Visit: Payer: Medicare Other | Admitting: Physician Assistant

## 2017-02-08 ENCOUNTER — Encounter: Payer: Medicare Other | Admitting: Nurse Practitioner

## 2017-02-08 DIAGNOSIS — L97222 Non-pressure 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 exposed: Secondary | ICD-10-CM | POA: Diagnosis not present

## 2017-02-09 NOTE — Progress Notes (Signed)
NEALIE, MCHATTON ([id number]) Visit Report for 02/08/2017 Chief Complaint Document Details Patient Name: [name], [name] Date of Service: 02/08/2017 9:45 AM Medical Record Number: [id number] Patient Account Number: [id number] Date of Birth/Sex: [birth year] (57 y.o. Female) Treating RN: Ahmed Prima Primary Care Provider: Tracie Harrier Other Clinician: Referring Provider: Tracie Harrier Treating Provider/Extender: Cathie Olden in Treatment: 11 Information Obtained from: Patient Chief Complaint She is here in follow up evaluation of left lower extremity ulcers Electronic Signature(s) Signed: 02/08/2017 4:11:02 PM By: Lawanda Cousins Entered By: Lawanda Cousins on 02/08/2017 10:32:43 [name], [name] ([id number]) -------------------------------------------------------------------------------- HPI Details Patient Name: [name], [name] Date of Service: 02/08/2017 9:45 AM Medical Record Number: [id number] Patient Account Number: [id number] Date of Birth/Sex: February 29, 1960 (57 y.o. Female) Treating RN: Carolyne Fiscal, Debi Primary Care Provider: Tracie Harrier Other Clinician: Referring Provider: Tracie Harrier Treating Provider/Extender: Cathie Olden in Treatment: 11 History of Present Illness Location: Patient presents with a wound to left lower leg. Quality: Patient reports experiencing a sharp pain to affected area(s). Severity: Patient states wound are getting worse. Duration: Patient has had the wound for < 4 weeks prior to presenting for treatment Timing: Pain in wound is Intermittent (comes and goes Context: The wound occurred when the patient had a laceration with a sharp object Modifying Factors: Other treatment(s) tried include:suturing done in the ER and sutures removed on 2 weeks Associated Signs and Symptoms: Patient reports having increase discharge. HPI Description: 57 year old patient seen by her PCP Dr. Ginette Pitman, for a follow-up of her left leg lacerated wound  had sutures removed a week before she was seen on 11/15/2016. there was swelling and erythema and the edges of the laceration had not completely healed and she had completed a course of doxycycline. She had quit smoking about a year ago. Past medical history significant for anxiety states, asthma, endometriosis, GERD, obesity, sleep apnea, status post appendectomy, cardiac catheterization, cholecystectomy, multiple colonoscopies and EGDs, hysterectomy, knee arthroscopies. she recently restarted smoking after quitting for over 20 years 12/21/2016 -- she now presents with a new wound to the left lower third of her leg where her dog bit her and caused a puncture wound which is now been draining and has some necrotic debris. She was seen by her PCP and he has put her on doxycycline orally. She has given up smoking this last week. 12/28/2016 -- she is using her Santyl ointment for the new wound on the lower leg and has been draining significantly. 01/04/17- she is here in follow-up evaluation for left lower extremity wounds. She is voicing no complaints or concerns. Wounds have improved in appearance, will add compression therapy 01/25/17 on evaluation today patient appears to be doing fairly well in regard to her left medial ankle and left anterior thigh ulcers. Both are very small at this point. There is no evidence of infection and patient is not having any significant discomfort. 02/08/17 she is here for follow evaluation of left leg ulcers; she is epithelialized and has been discharged from clinic Electronic Signature(s) Signed: 02/08/2017 4:11:02 PM By: Lawanda Cousins Entered By: Lawanda Cousins on 02/08/2017 10:33:11 [name], [name] ([id number]) -------------------------------------------------------------------------------- Physical Exam Details Patient Name: [name], [name] Date of Service: 02/08/2017 9:45 AM Medical Record Number: [id number] Patient Account Number: [id number] Date of  Birth/Sex: 09-May-1960 (57 y.o. Female) Treating RN: Carolyne Fiscal, Debi Primary Care Provider: Tracie Harrier Other Clinician: Referring Provider: Tracie Harrier Treating Provider/Extender: Cathie Olden in Treatment: 11 Electronic Signature(s) Signed: 02/08/2017 4:11:02 PM By: Lawanda Cousins Entered By:  Preeya Cleckley on 02/08/2017 10:33:59 [name], [name] ([id number]) -------------------------------------------------------------------------------- Physician Orders Details Patient Name: [name], [name] Date of Service: 02/08/2017 9:45 AM Medical Record Number: [id number] Patient Account Number: [id number] Date of Birth/Sex: [birth year] (57 y.o. Female) Treating RN: Carolyne Fiscal, Debi Primary Care Provider: Tracie Harrier Other Clinician: Referring Provider: Tracie Harrier Treating Provider/Extender: Cathie Olden in Treatment: 11 Verbal / Phone Orders: Yes Clinician: Carolyne Fiscal, Debi Read Back and Verified: Yes Diagnosis Coding Discharge From [facility] o Discharge from [place] - Please keep area clean, dry, and moisturized. Please call our office if you have any questions or concerns. Electronic Signature(s) Signed: 02/08/2017 4:11:02 PM By: Lawanda Cousins Entered By: Lawanda Cousins on 02/08/2017 10:34:11 Wingler, Omie R. ([id number]) -------------------------------------------------------------------------------- Problem List Details Patient Name: [name], [name] Date of Service: 02/08/2017 9:45 AM Medical Record Number: [id number] Patient Account Number: [id number] Date of Birth/Sex: [birth year] (57 y.o. Female) Treating RN: Carolyne Fiscal, Debi Primary Care Provider: Tracie Harrier Other Clinician: Referring Provider: Tracie Harrier Treating Provider/Extender: Cathie Olden in Treatment: 11 Active Problems ICD-10 Encounter Code Description Active Date Diagnosis 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d 11/23/2016  Yes S81.812A Laceration without foreign body, left lower leg, initial encounter 11/23/2016 Yes E66.01 Morbid (severe) obesity due to excess calories 11/23/2016 Yes F17.218 Nicotine dependence, cigarettes, with other nicotine-induced 11/23/2016 Yes disorders S81.852A Open bite, left lower leg, initial encounter 12/21/2016 Yes 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d 12/21/2016 Yes Inactive Problems Resolved Problems Electronic Signature(s) Signed: 02/08/2017 4:11:02 PM By: Lawanda Cousins Entered By: Lawanda Cousins on 02/08/2017 10:32:30 Glymph, Keimya R. ([id number]) -------------------------------------------------------------------------------- Progress Note Details Patient Name: [name], [name] Date of Service: 02/08/2017 9:45 AM Medical Record Number: [id number] Patient Account Number: [id number] Date of Birth/Sex: [birth year] (57 y.o. Female) Treating RN: Ahmed Prima Primary Care Provider: Tracie Harrier Other Clinician: Referring Provider: Tracie Harrier Treating Provider/Extender: Cathie Olden in Treatment: 11 Subjective Chief Complaint Information obtained from Patient She is here in follow up evaluation of left lower extremity ulcers History of Present Illness (HPI) The following HPI elements were documented for the patient's wound: Location: Patient presents with a wound to left lower leg. Quality: Patient reports experiencing a sharp pain to affected area(s). Severity: Patient states wound are getting worse. Duration: Patient has had the wound for < 4 weeks prior to presenting for treatment Timing: Pain in wound is Intermittent (comes and goes Context: The wound occurred when the patient had a laceration with a sharp object Modifying Factors: Other treatment(s) tried include:suturing done in the ER and sutures removed on 2 weeks Associated Signs and Symptoms: Patient reports having increase discharge. 57 year old patient seen by her  PCP Dr. Ginette Pitman, for a follow-up of her left leg lacerated wound had sutures removed a week before she was seen on 11/15/2016. there was swelling and erythema and the edges of the laceration had not completely healed and she had completed a course of doxycycline. She had quit smoking about a year ago. Past medical history significant for anxiety states, asthma, endometriosis, GERD, obesity, sleep apnea, status post appendectomy, cardiac catheterization, cholecystectomy, multiple colonoscopies and EGDs, hysterectomy, knee arthroscopies. she recently restarted smoking after quitting for over 20 years 12/21/2016 -- she now presents with a new wound to the left lower third of her leg where her dog bit her and caused a puncture wound which is now been draining and has some necrotic debris. She was seen by her PCP and he has  put her on doxycycline orally. She has given up smoking this last week. 12/28/2016 -- she is using her Santyl ointment for the new wound on the lower leg and has been draining significantly. 01/04/17- she is here in follow-up evaluation for left lower extremity wounds. She is voicing no complaints or concerns. Wounds have improved in appearance, will add compression therapy 01/25/17 on evaluation today patient appears to be doing fairly well in regard to her left medial ankle and left anterior thigh ulcers. Both are very small at this point. There is no evidence of infection and patient is not having any significant discomfort. 02/08/17 she is here for follow evaluation of left leg ulcers; she is epithelialized and has been discharged from clinic Patient History Information obtained from Patient. Family History Cancer - Father, Diabetes - Mother,Siblings, Heart Disease - Mother,Siblings, Hypertension - Mother,Father,Siblings, Lung Disease - Mother,Father,Siblings, Stroke - Mother,Father, Thyroid Problems - Father,Siblings, No family history of Hereditary Spherocytosis, Kidney Disease,  Seizures, Tuberculosis. Social History [name], [name] ([id number]) Current every day smoker - 3 cigarettes a day, Marital Status - Married, Alcohol Use - Never, Drug Use - No History, Caffeine Use - Daily. Objective Constitutional Vitals Time Taken: 9:31 AM, Height: 65 in, Weight: 294.4 lbs, BMI: 49, Temperature: 98.5 F, Pulse: 65 bpm, Respiratory Rate: 20 breaths/min, Blood Pressure: 141/76 mmHg. Integumentary (Hair, Skin) Wound #1 status is Open. Original cause of wound was Trauma. The wound is located on the Left,Lateral Lower Leg. The wound measures 0cm length x 0cm width x 0cm depth; 0cm^2 area and 0cm^3 volume. There is no tunneling or undermining noted. There is a none present amount of drainage noted. The wound margin is distinct with the outline attached to the wound base. There is no granulation within the wound bed. There is no necrotic tissue within the wound bed. The periwound skin appearance exhibited: Scarring, Erythema. The periwound skin appearance did not exhibit: Maceration. The surrounding wound skin color is noted with erythema which is circumferential. Periwound temperature was noted as No Abnormality. The periwound has tenderness on palpation. Wound #2 status is Open. Original cause of wound was Trauma. The wound is located on the Left,Medial Lower Leg. The wound measures 0cm length x 0cm width x 0cm depth; 0cm^2 area and 0cm^3 volume. There is no tunneling or undermining noted. There is a none present amount of drainage noted. The wound margin is flat and intact. There is no granulation within the wound bed. There is no necrotic tissue within the wound bed. The periwound skin appearance exhibited: Erythema. The periwound skin appearance did not exhibit: Maceration. The surrounding wound skin color is noted with erythema which is circumferential. Periwound temperature was noted as No Abnormality. Assessment Active Problems ICD-10 L97.222 - Non-pressure chronic  ulcer of left calf with fat layer exposed S81.812A - Laceration without foreign body, left lower leg, initial encounter E66.01 - Morbid (severe) obesity due to excess calories F17.218 - Nicotine dependence, cigarettes, with other nicotine-induced disorders S81.852A - Open bite, left lower leg, initial encounter L97.222 - Non-pressure 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 exposed Plan [name], [name] ([id number]) Discharge From [facility]: Discharge from [place] - Please keep area clean, dry, and moisturized. Please call our office if you have any questions or concerns. discharged- healed Electronic Signature(s) Signed: 02/08/2017 4:11:02 PM By: Lawanda Cousins Entered By: Lawanda Cousins on 02/08/2017 10:34:25 [name], [name] ([id number]) -------------------------------------------------------------------------------- ROS/PFSH Details Patient Name: [name], [name] Date of Service: 02/08/2017 9:45 AM  Medical Record Number: [id number] Patient Account Number: [id number] Date of Birth/Sex: Dec 20, 1960 (57 y.o. Female) Treating RN: Carolyne Fiscal, Debi Primary Care Provider: Tracie Harrier Other Clinician: Referring Provider: Tracie Harrier Treating Provider/Extender: Cathie Olden in Treatment: 11 Information Obtained From Patient Wound History Do you currently have one or more open woundso Yes How many open wounds do you currently haveo 1 Approximately how long have you had your woundso 4 weeks How have you been treating your wound(s) until nowo saline and neosporin Has your wound(s) ever healed and then re-openedo No Have you had any lab work done in the past montho No Have you tested positive for an antibiotic resistant organism (MRSA, VRE)o No Have you tested positive for osteomyelitis (bone infection)o No Have you had any tests for circulation on your legso Yes Who ordered the testo dr. dew Where was the test doneo [facility] Have you had other problems associated  with your woundso Swelling Respiratory Medical History: Positive for: Asthma; Sleep Apnea Cardiovascular Medical History: Positive for: Hypertension Musculoskeletal Medical History: Positive for: Osteoarthritis Immunizations Pneumococcal Vaccine: Received Pneumococcal Vaccination: Yes Implantable Devices Family and Social History Cancer: Yes - Father; Diabetes: Yes - Mother,Siblings; Heart Disease: Yes - Mother,Siblings; Hereditary Spherocytosis: No; Hypertension: Yes - Mother,Father,Siblings; Kidney Disease: No; Lung Disease: Yes - Mother,Father,Siblings; Seizures: No; Stroke: Yes - Mother,Father; Thyroid Problems: Yes - Father,Siblings; Tuberculosis: No; Current every day smoker - 3 cigarettes a day; Marital Status - Married; Alcohol Use: Never; Drug Use: No History; Caffeine Use: Daily; Financial Concerns: No; Food, Clothing or Shelter Needs: No; Support System Lacking: No; Transportation Concerns: No; Advanced Directives: No; Patient does not want information on Advanced Directives; Do not resuscitate: No; Living Will: No; Medical Power of Attorney: No Physician Affirmation I have reviewed and agree with the above information. [name], [name] ([id number]) Electronic Signature(s) Signed: 02/08/2017 4:11:02 PM By: Lawanda Cousins Signed: 02/08/2017 4:11:23 PM By: Alric Quan Entered By: Lawanda Cousins on 02/08/2017 10:33:41 Daher, Miosha R. ([id number]) -------------------------------------------------------------------------------- SuperBill Details Patient Name: Profitt, Alisha R. Date of Service: 02/08/2017 Medical Record Number: [id number] Patient Account Number: [id number] Date of Birth/Sex: 01/18/1960 (57 y.o. Female) Treating RN: Carolyne Fiscal, Debi Primary Care Provider: Tracie Harrier Other Clinician: Referring Provider: Tracie Harrier Treating Provider/Extender: Cathie Olden in Treatment: 11 Diagnosis Coding ICD-10 Codes Code Description [phone] Non-pressure  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 exposed S81.812D Laceration without foreign body, left lower leg, subsequent encounter E66.01 Morbid (severe) obesity due to excess calories F17.218 Nicotine dependence, cigarettes, with other nicotine-induced disorders S81.852D Open bite, left lower leg, subsequent encounter 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d Facility Procedures CPT4 Code: 51884166 Description: [phone] - WOUND CARE VISIT-LEV 2 EST PT Modifier: Quantity: 1 Physician Procedures CPT4 Code: 6010932 Description: 35573 - WC PHYS LEVEL 2 - EST PT ICD-10 Diagnosis Description L97.222 Non-pressure 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 exp S81.812D Laceration without foreign body, left lower leg, subsequen F17.218 Nicotine dependence, cigarettes,  with other nicotine-induc S81.852D Open bite, left lower leg, subsequent encounter Modifier: osed t encounter ed disorders Quantity: 1 Electronic Signature(s) Signed: 02/08/2017 1:05:20 PM By: Alric Quan Signed: 02/08/2017 4:11:02 PM By: Lawanda Cousins Entered By: Alric Quan on 02/08/2017 13:05:20

## 2017-02-09 NOTE — Progress Notes (Signed)
GRACE, VALLEY ([id number]) Visit Report for 02/08/2017 Arrival Information Details Patient Name: [name], [name] Date of Service: 02/08/2017 9:45 AM Medical Record Number: [id number] Patient Account Number: [id number] Date of Birth/Sex: 12-02-1960 (57 y.o. Female) Treating RN: [name] Primary Care [name]: [name] Other Clinician: Referring [name]: [name] Treating [name]/Extender: [name], [name] in Treatment: 11 Visit Information History Since Last Visit All ordered tests and consults were completed: No Patient Arrived: Ambulatory Added or deleted any medications: No Arrival Time: 09:27 Any new allergies or adverse reactions: No Accompanied By: self Had a fall or experienced change in No Transfer Assistance: None activities of daily living that may affect Patient Identification Verified: Yes risk of falls: Secondary Verification Process Completed: Yes Signs or symptoms of abuse/neglect since last visito No Patient Requires Transmission-Based No Hospitalized since last visit: No Precautions: Has Dressing in Place as Prescribed: Yes Patient Has Alerts: No Pain Present Now: No Electronic Signature(s) Signed: 02/08/2017 4:11:23 PM By: [name] Entered By: [name] on 02/08/2017 09:27:41 [name], [name] ([id number]) -------------------------------------------------------------------------------- Clinic Level of Care Assessment Details Patient Name: [name], [name] Date of Service: 02/08/2017 9:45 AM Medical Record Number: [id number] Patient Account Number: [id number] Date of Birth/Sex: 1960/08/04 (57 y.o. Female) Treating RN: [name] Primary Care Chanique Duca: [name] Other Clinician: Referring [name]: [name] Treating Lan Entsminger/Extender: [name] in Treatment: 11 Clinic Level of Care Assessment Items TOOL 4 Quantity Score X - Use when only an EandM is performed on FOLLOW-UP visit 1  0 ASSESSMENTS - Nursing Assessment / Reassessment X - Reassessment of Co-morbidities (includes updates in patient status) 1 10 X- 1 5 Reassessment of Adherence to Treatment Plan ASSESSMENTS - Wound and Skin Assessment / Reassessment []  - Simple Wound Assessment / Reassessment - one wound 0 X- 2 5 Complex Wound Assessment / Reassessment - multiple wounds []  - 0 Dermatologic / Skin Assessment (not related to wound area) ASSESSMENTS - Focused Assessment []  - Circumferential Edema Measurements - multi extremities 0 []  - 0 Nutritional Assessment / Counseling / Intervention []  - 0 Lower Extremity Assessment (monofilament, tuning fork, pulses) []  - 0 Peripheral Arterial Disease Assessment (using hand held doppler) ASSESSMENTS - Ostomy and/or Continence Assessment and Care []  - Incontinence Assessment and Management 0 []  - 0 Ostomy Care Assessment and Management (repouching, etc.) PROCESS - Coordination of Care X - Simple Patient / Family Education for ongoing care 1 15 []  - 0 Complex (extensive) Patient / Family Education for ongoing care []  - 0 Staff obtains Programmer, systems, Records, Test Results / Process Orders []  - 0 Staff telephones HHA, Nursing Homes / Clarify orders / etc []  - 0 Routine Transfer to another Facility (non-emergent condition) []  - 0 Routine Hospital Admission (non-emergent condition) []  - 0 New Admissions / Biomedical engineer / Ordering NPWT, Apligraf, etc. []  - 0 Emergency Hospital Admission (emergent condition) X- 1 10 Simple Discharge Coordination [name], [name] ([id number]) []  - 0 Complex (extensive) Discharge Coordination PROCESS - Special Needs []  - Pediatric / Minor Patient Management 0 []  - 0 Isolation Patient Management []  - 0 Hearing / Language / Visual special needs []  - 0 Assessment of Community assistance (transportation, D/C planning, etc.) []  - 0 Additional assistance / Altered mentation []  - 0 Support Surface(s) Assessment (bed,  cushion, seat, etc.) INTERVENTIONS - Wound Cleansing / Measurement []  - Simple Wound Cleansing - one wound 0 X- 2 5 Complex Wound Cleansing - multiple wounds X- 1 5 Wound Imaging (photographs - any number  of wounds) []  - 0 Wound Tracing (instead of photographs) []  - 0 Simple Wound Measurement - one wound []  - 0 Complex Wound Measurement - multiple wounds INTERVENTIONS - Wound Dressings []  - Small Wound Dressing one or multiple wounds 0 []  - 0 Medium Wound Dressing one or multiple wounds []  - 0 Large Wound Dressing one or multiple wounds []  - 0 Application of Medications - topical []  - 0 Application of Medications - injection INTERVENTIONS - Miscellaneous []  - External ear exam 0 []  - 0 Specimen Collection (cultures, biopsies, blood, body fluids, etc.) []  - 0 Specimen(s) / Culture(s) sent or taken to Lab for analysis []  - 0 Patient Transfer (multiple staff / Civil Service fast streamer / Similar devices) []  - 0 Simple Staple / Suture removal (25 or less) []  - 0 Complex Staple / Suture removal (26 or more) []  - 0 Hypo / Hyperglycemic Management (close monitor of Blood Glucose) []  - 0 Ankle / Brachial Index (ABI) - do not check if billed separately X- 1 5 Vital Signs [name], [name] ([id number]) Has the patient been seen at the hospital within the last three years: Yes Total Score: 70 Level Of Care: New/Established - Level 2 Electronic Signature(s) Signed: 02/08/2017 4:11:23 PM By: [name] Entered By: [name] on 02/08/2017 13:05:12 [name], [name] ([id number]) -------------------------------------------------------------------------------- Encounter Discharge Information Details Patient Name: [name], [name] Date of Service: 02/08/2017 9:45 AM Medical Record Number: [id number] Patient Account Number: [id number] Date of Birth/Sex: Jul 12, 1960 (57 y.o. Female) Treating RN: [name] Primary Care [name]: [name] Other Clinician: Referring Kalasia Crafton:  [name] Treating Caela Huot/Extender: [name] in Treatment: 11 Encounter Discharge Information Items Discharge Pain Level: 0 Discharge Condition: Stable Ambulatory Status: Ambulatory Discharge Destination: Home Transportation: Private Auto Accompanied By: self Schedule Follow-up Appointment: No Medication Reconciliation completed and No provided to Patient/Care Deuce Paternoster: Provided on Clinical Summary of Care: 02/08/2017 Form Type Recipient Paper Patient RD Electronic Signature(s) Signed: 02/08/2017 4:11:23 PM By: [name] Entered By: [name] on 02/08/2017 09:41:37 [name], [name] ([id number]) -------------------------------------------------------------------------------- Lower Extremity Assessment Details Patient Name: Fettes, Monia R. Date of Service: 02/08/2017 9:45 AM Medical Record Number: [id number] Patient Account Number: [id number] Date of Birth/Sex: May 16, 1960 (57 y.o. Female) Treating RN: [name] Primary Care Sharyl Panchal: [name] Other Clinician: Referring Shadrack Brummitt: [name] Treating Gor Vestal/Extender: [name], [name] in Treatment: 11 Vascular Assessment Pulses: Dorsalis Pedis Palpable: [Left:Yes] Posterior Tibial Extremity colors, hair growth, and conditions: Extremity Color: [Left:Mottled] Temperature of Extremity: [Left:Warm] Capillary Refill: [Left:< 3 seconds] Electronic Signature(s) Signed: 02/08/2017 4:11:23 PM By: [name] Entered By: [name] on 02/08/2017 09:33:01 Gutterman, [name] ([id number]) -------------------------------------------------------------------------------- Multi Wound Chart Details Patient Name: Bazen, Kaelen R. Date of Service: 02/08/2017 9:45 AM Medical Record Number: [id number] Patient Account Number: [id number] Date of Birth/Sex: 1961-01-15 (57 y.o. Female) Treating RN: Ahmed Prima Primary Care Javien Tesch: [name] Other  Clinician: Referring Konstantin Lehnen: [name] Treating Dean Goldner/Extender: [name] in Treatment: 11 Vital Signs Height(in): 65 Pulse(bpm): 3 Weight(lbs): 294.4 Blood Pressure(mmHg): 141/76 Body Mass Index(BMI): 49 Temperature(F): 98.5 Respiratory Rate 20 (breaths/min): Photos: [N/A:N/A] Wound Location: Left Lower Leg - Lateral Left Lower Leg - Medial N/A Wounding Event: Trauma Trauma N/A Primary Etiology: Arterial Insufficiency Ulcer Trauma, Other N/A Secondary Etiology: Trauma, Other N/A N/A Comorbid History: Asthma, Sleep Apnea, Asthma, Sleep Apnea, N/A Hypertension, Osteoarthritis Hypertension, Osteoarthritis Date Acquired: 10/26/2016 12/16/2016 N/A [name] of Treatment: 11 7 N/A Wound Status: Open Open N/A Measurements L x W x D 0x0x0 0x0x0 N/A (  cm) Area (cm) : 0 0 N/A Volume (cm) : 0 0 N/A % Reduction in Area: 100.00% 100.00% N/A % Reduction in Volume: 100.00% 100.00% N/A Classification: Partial Thickness Partial Thickness N/A Exudate Amount: None Present None Present N/A Wound Margin: Distinct, outline attached Flat and Intact N/A Granulation Amount: None Present (0%) None Present (0%) N/A Necrotic Amount: None Present (0%) None Present (0%) N/A Epithelialization: Large (67-100%) Large (67-100%) N/A Periwound Skin Texture: Scarring: Yes No Abnormalities Noted N/A Periwound Skin Moisture: Maceration: No Maceration: No N/A Periwound Skin Color: Erythema: Yes Erythema: Yes N/A Erythema Location: Circumferential Circumferential N/A Temperature: No Abnormality No Abnormality N/A Tenderness on Palpation: Yes No N/A Wound Preparation: Ulcer Cleansing: Ulcer Cleansing: N/A Rinsed/Irrigated with Saline Rinsed/Irrigated with Saline [name], [name] ([id number]) Topical Anesthetic Applied: Topical Anesthetic Applied: None None Treatment Notes Electronic Signature(s) Signed: 02/08/2017 4:11:02 PM By: Lawanda Cousins Entered By: Lawanda Cousins on  02/08/2017 10:32:35 [name], [name] ([id number]) -------------------------------------------------------------------------------- [place] Details Patient Name: [name], [name] Date of Service: 02/08/2017 9:45 AM Medical Record Number: [id number] Patient Account Number: [id number] Date of Birth/Sex: [birth year] (57 y.o. Female) Treating RN: Ahmed Prima Primary Care Patrice Matthew: [name] Other Clinician: Referring Rito Lecomte: [name] Treating Regene Mccarthy/Extender: [name], [name] in Treatment: 11 Active Inactive Electronic Signature(s) Signed: 02/08/2017 4:11:23 PM By: [name] Entered By: [name] on 02/08/2017 09:39:56 [name], [name] ([id number]) -------------------------------------------------------------------------------- Pain Assessment Details Patient Name: Tieu, Talynn R. Date of Service: 02/08/2017 9:45 AM Medical Record Number: [id number] Patient Account Number: [id number] Date of Birth/Sex: 1960-05-26 (57 y.o. Female) Treating RN: [name] Primary Care Kathey Simer: [name] Other Clinician: Referring Jaythen Hamme: [name] Treating Amariz Flamenco/Extender: [name], [name] in Treatment: 11 Active Problems Location of Pain Severity and Description of Pain Patient Has Paino No Site Locations Pain Management and Medication Current Pain Management: Electronic Signature(s) Signed: 02/08/2017 4:11:23 PM By: [name] Entered By: [name] on 02/08/2017 09:29:05 Savidge, Briseida R. ([id number]) -------------------------------------------------------------------------------- Patient/Caregiver Education Details Patient Name: Delavega, Daneille R. Date of Service: 02/08/2017 9:45 AM Medical Record Number: [id number] Patient Account Number: [id number] Date of Birth/Gender: June 27, 1960 (57 y.o. Female) Treating RN: Ahmed Prima Primary Care Physician: [name] Other Clinician: Referring  Physician: Tracie [name] Treating Physician/Extender: [name] in Treatment: 11 Education Assessment Education Provided To: Patient Education Topics Provided Wound/Skin Impairment: Handouts: Other: Please call our office if you have any questions or concerns. Methods: Explain/Verbal Responses: State content correctly Electronic Signature(s) Signed: 02/08/2017 4:11:23 PM By: [name] Entered By: [name] on 02/08/2017 09:41:48 Garbers, Dorea R. ([id number]) -------------------------------------------------------------------------------- Wound Assessment Details Patient Name: Netzley, Arleth R. Date of Service: 02/08/2017 9:45 AM Medical Record Number: [id number] Patient Account Number: [id number] Date of Birth/Sex: October 27, 1960 (57 y.o. Female) Treating RN: [name] Primary Care Chrishauna Mee: [name] Other Clinician: Referring Kandance Yano: [name] Treating Sophiana Milanese/Extender: [name], [name] in Treatment: 11 Wound Status Wound Number: 1 Primary Etiology: Arterial Insufficiency Ulcer Wound Location: Left Lower Leg - Lateral Secondary Trauma, Other Etiology: Wounding Event: Trauma Wound Status: Open Date Acquired: 10/26/2016 Comorbid History: Asthma, Sleep Apnea, Hypertension, [name] Of Treatment: 11 Osteoarthritis Clustered Wound: No Photos Photo Uploaded By: [name] on 02/08/2017 09:44:10 Wound Measurements Length: (cm) 0 % R Width: (cm) 0 % R Depth: (cm) 0 Epi Area: (cm) 0 Tu Volume: (cm) 0 Un eduction in Area: 100% eduction in Volume: 100% thelialization: Large (67-100%) nneling: No dermining: No Wound Description Classification: Partial Thickness Wound Margin: Distinct, outline attached Exudate Amount: None  Present Foul Odor After Cleansing: No Slough/Fibrino No Wound Bed Granulation Amount: None Present (0%) Necrotic Amount: None Present (0%) Periwound Skin Texture Texture Color No  Abnormalities Noted: No No Abnormalities Noted: No Scarring: Yes Erythema: Yes Erythema Location: Circumferential Moisture No Abnormalities Noted: No Temperature / Pain Maceration: No Temperature: No Abnormality Tenderness on Palpation: Yes Morga, Carlynn R. ([id number]) Wound Preparation Ulcer Cleansing: Rinsed/Irrigated with Saline Topical Anesthetic Applied: None Electronic Signature(s) Signed: 02/08/2017 4:11:23 PM By: [name] Entered By: [name] on 02/08/2017 09:29:53 Bugge, Andrey R. ([id number]) -------------------------------------------------------------------------------- Wound Assessment Details Patient Name: Dapper, Kitana R. Date of Service: 02/08/2017 9:45 AM Medical Record Number: [id number] Patient Account Number: [id number] Date of Birth/Sex: [birth year] (57 y.o. Female) Treating RN: [name] Primary Care Mayjor Ager: [name] Other Clinician: Referring Derel Mcglasson: [name] Treating Davinci Glotfelty/Extender: [name], [name] in Treatment: 11 Wound Status Wound Number: 2 Primary Trauma, Other Etiology: Wound Location: Left Lower Leg - Medial Wound Status: Open Wounding Event: Trauma Comorbid Asthma, Sleep Apnea, Hypertension, Date Acquired: 12/16/2016 History: Osteoarthritis [name] Of Treatment: 7 Clustered Wound: No Photos Photo Uploaded By: [name] on 02/08/2017 09:44:39 Wound Measurements Length: (cm) 0 % Re Width: (cm) 0 % Re Depth: (cm) 0 Epit Area: (cm) 0 Tun Volume: (cm) 0 Und duction in Area: 100% duction in Volume: 100% helialization: Large (67-100%) neling: No ermining: No Wound Description Classification: Partial Thickness Wound Margin: Flat and Intact Exudate Amount: None Present Foul Odor After Cleansing: No Slough/Fibrino No Wound Bed Granulation Amount: None Present (0%) Exposed Structure Necrotic Amount: None Present (0%) Fascia Exposed: No Fat Layer (Subcutaneous Tissue) Exposed:  No Tendon Exposed: No Muscle Exposed: No Joint Exposed: No Bone Exposed: No Periwound Skin Texture Texture Color No Abnormalities Noted: No No Abnormalities Noted: No Krauter, Bernell R. ([id number]) Moisture Erythema: Yes No Abnormalities Noted: No Erythema Location: Circumferential Maceration: No Temperature / Pain Temperature: No Abnormality Wound Preparation Ulcer Cleansing: Rinsed/Irrigated with Saline Topical Anesthetic Applied: None Electronic Signature(s) Signed: 02/08/2017 4:11:23 PM By: [name] Entered By: [name] on 02/08/2017 09:30:12 Sheffer, Murray R. ([id number]) -------------------------------------------------------------------------------- Vitals Details Patient Name: Delmundo, Hazley R. Date of Service: 02/08/2017 9:45 AM Medical Record Number: [id number] Patient Account Number: [id number] Date of Birth/Sex: November 04, 1960 (57 y.o. Female) Treating RN: [name] Primary Care Antwoin Lackey: [name] Other Clinician: Referring Talene Glastetter: [name] Treating Dewana Ammirati/Extender: [name], [name] in Treatment: 11 Vital Signs Time Taken: 09:31 Temperature (F): 98.5 Height (in): 65 Pulse (bpm): 65 Weight (lbs): 294.4 Respiratory Rate (breaths/min): 20 Body Mass Index (BMI): 49 Blood Pressure (mmHg): 141/76 Reference Range: 80 - 120 mg / dl Electronic Signature(s) Signed: 02/08/2017 4:11:23 PM By: [name] Entered By: [name] on 02/08/2017 09:32:40

## 2017-03-14 ENCOUNTER — Ambulatory Visit: Payer: Medicare Other | Admitting: Pulmonary Disease

## 2017-03-14 ENCOUNTER — Encounter: Payer: Self-pay | Admitting: Pulmonary Disease

## 2017-03-14 VITALS — BP 138/88 | HR 85 | Ht 65.0 in

## 2017-03-14 DIAGNOSIS — Z8701 Personal history of pneumonia (recurrent): Secondary | ICD-10-CM | POA: Diagnosis not present

## 2017-03-14 DIAGNOSIS — J4541 Moderate persistent asthma with (acute) exacerbation: Secondary | ICD-10-CM | POA: Diagnosis not present

## 2017-03-14 DIAGNOSIS — F17200 Nicotine dependence, unspecified, uncomplicated: Secondary | ICD-10-CM

## 2017-03-14 MED ORDER — FLUTICASONE-SALMETEROL 230-21 MCG/ACT IN AERO: 2.0000 | INHALATION_SPRAY | Freq: Two times a day (BID) | RESPIRATORY_TRACT | 12 refills | Status: DC

## 2017-03-14 MED ORDER — PREDNISONE 20 MG PO TABS: ORAL_TABLET | ORAL | 0 refills | Status: DC

## 2017-03-14 NOTE — Patient Instructions (Addendum)
Prednisone 60 mg (3 tablets) times 3 days, 40 mg x 3 days, 20 mg x 3 days, stop Initiate Advair inhaler-2 actuations twice a day.  Rinse mouth after use Continue albuterol inhaler as needed for increased shortness of breath, wheezing, chest tightness Continue DuoNeb as your second line rescue medicine for when the albuterol inhaler fails to relieve symptoms Follow-up in 6-8 weeks with full pulmonary function tests and CXR prior to that visit

## 2017-03-15 ENCOUNTER — Ambulatory Visit: Payer: Medicare Other | Admitting: Psychiatry

## 2017-03-15 ENCOUNTER — Other Ambulatory Visit: Payer: Self-pay

## 2017-03-15 ENCOUNTER — Encounter: Payer: Self-pay | Admitting: Psychiatry

## 2017-03-15 VITALS — BP 129/79 | HR 73 | Temp 97.7°F | Wt 290.0 lb

## 2017-03-15 DIAGNOSIS — F411 Generalized anxiety disorder: Secondary | ICD-10-CM | POA: Diagnosis not present

## 2017-03-15 DIAGNOSIS — F331 Major depressive disorder, recurrent, moderate: Secondary | ICD-10-CM

## 2017-03-15 DIAGNOSIS — F4310 Post-traumatic stress disorder, unspecified: Secondary | ICD-10-CM | POA: Diagnosis not present

## 2017-03-15 MED ORDER — TRAZODONE HCL 100 MG PO TABS: 100.0000 mg | ORAL_TABLET | Freq: Every day | ORAL | 2 refills | Status: DC

## 2017-03-15 MED ORDER — BUSPIRONE HCL 30 MG PO TABS: 30.0000 mg | ORAL_TABLET | Freq: Two times a day (BID) | ORAL | 2 refills | Status: DC

## 2017-03-15 MED ORDER — ESCITALOPRAM OXALATE 10 MG PO TABS: ORAL_TABLET | ORAL | 2 refills | Status: DC

## 2017-03-15 NOTE — Progress Notes (Signed)
Patient ID: [name], female   DOB: Mar 13, 1960, 57 y.o.   MRN: [id number]   [facility] MD/PA/NP OP Progress Note  03/15/2017 9:45 AM [name]  MRN:  [id number]  Subjective:  Patient returns for follow-up of her major depressive disorder, generalized anxiety disorder and grief. Patient today reports that she is hanging in there. She has been physically ill with asthma and pneumonia and sinusitis infections. She is still not over that. Continues to have some issues surrounding her father's death but managing. She is sleeping okay and eating okay. She continues to see a therapist. She denies any suicidal thoughts.   Chief Complaint: doing okay Chief Complaint    Follow-up; Medication Refill     Visit Diagnosis:     ICD-10-CM   1. GAD (generalized anxiety disorder) F41.1   2. Post traumatic stress disorder (PTSD) F43.10   3. MDD (major depressive disorder), recurrent episode, moderate ([place]) F33.1     Past Medical History:  Past Medical History:  Diagnosis Date  . Anxiety   . Arthritis   . Asthma   . Depression   . Diverticulitis   . Heart palpitations   . Liver disease   . Migraines   . Sleep apnea     Past Surgical History:  Procedure Laterality Date  . ABDOMINAL HYSTERECTOMY    . APPENDECTOMY    . CHOLECYSTECTOMY    . COLONOSCOPY WITH PROPOFOL N/A 04/21/2015   Procedure: COLONOSCOPY WITH PROPOFOL;  Surgeon: Manya Silvas, MD;  Location: [facility] ENDOSCOPY;  Service: Endoscopy;  Laterality: N/A;  . SINUSOTOMY    . TONSILLECTOMY     Family History:  Family History  Problem Relation Age of Onset  . Diabetes Mother   . Muscular dystrophy Mother   . Heart failure Mother   . Anxiety disorder Mother   . Skin cancer Father   . Heart disease Father   . Hypertension Father   . Depression Father   . Alcohol abuse Father   . Drug abuse Father   . Heart disease Sister   . Diabetes Sister   . Anxiety disorder Sister   . Hypertension Brother   . Diverticulosis Brother   .  Heart disease Sister   . Diabetes Sister   . Heart disease Sister   . Diabetes Sister    Social History:  Social History   Socioeconomic History  . Marital status: Married    Spouse name: Journalist, newspaper  . Number of children: 0  . Years of education: None  . Highest education level: High school graduate  Social Needs  . Financial resource strain: Not hard at all  . Food insecurity - worry: Never true  . Food insecurity - inability: Never true  . Transportation needs - medical: No  . Transportation needs - non-medical: No  Occupational History    Comment: disabled  Tobacco Use  . Smoking status: Former Smoker    Types: Cigarettes    Last attempt to quit: 01/15/2017    Years since quitting: 0.1  . Smokeless tobacco: Never Used  Substance and Sexual Activity  . Alcohol use: No    Alcohol/week: 0.0 oz  . Drug use: No  . Sexual activity: Yes    Birth control/protection: None  Other Topics Concern  . None  Social History Narrative  . None   Additional History:   Assessment:   Musculoskeletal: Strength & Muscle Tone: within normal limits Gait & Station: normal Patient leans: N/A  Psychiatric Specialty Exam:  Medication Refill   Anxiety  Patient reports no insomnia, nervous/anxious behavior or suicidal ideas.    Depression         Associated symptoms include does not have insomnia and no suicidal ideas.  Past medical history includes anxiety.     Review of Systems  Psychiatric/Behavioral: Negative for depression, hallucinations, memory loss, substance abuse and suicidal ideas. The patient is not nervous/anxious and does not have insomnia.   All other systems reviewed and are negative.   Blood pressure 129/79, pulse 73, temperature 97.7 F (36.5 C), temperature source Oral, weight 131.5 kg (290 lb).Body mass index is 48.26 kg/m.  General Appearance: Neat and Well Groomed  Eye Contact:  Good  Speech:  Normal Rate  Volume:  Normal  Mood:  Ok, dealing with father`s  death  Affect: Pleasant   Thought Process:  Linear and Logical  Orientation:  Full (Time, Place, and Person)  Thought Content:  Negative  Suicidal Thoughts: denies  Homicidal Thoughts:  No  Memory:  Immediate;   Good Recent;   Good Remote;   Good  Judgement:  Good  Insight:  Good  Psychomotor Activity:  Negative  Concentration:  Good  Recall:  Good  Fund of Knowledge: Negative  Language: Good  Akathisia:  Negative  Handed:  Right  AIMS (if indicated):  na  Assets:  Communication Skills Desire for Improvement Social Support  ADL's:  Intact  Cognition: WNL  Sleep:  better   Is the patient at risk to self?  No. Has the patient been a risk to self in the past 6 months?  No. Has the patient been a risk to self within the distant past?  No. Is the patient a risk to others?  No. Has the patient been a risk to others in the past 6 months?  No. Has the patient been a risk to others within the distant past?  No.  Current Medications: Current Outpatient Medications  Medication Sig Dispense Refill  . amLODipine (NORVASC) 5 MG tablet Take 5 mg by mouth 1 day or 1 dose.    Marland Kitchen aspirin EC 81 MG tablet Take 81 mg by mouth daily.    . busPIRone (BUSPAR) 30 MG tablet Take 1 tablet (30 mg total) by mouth 2 (two) times daily. 60 tablet 2  . cetirizine (ZYRTEC ALLERGY) 10 MG tablet Take by mouth.    . escitalopram (LEXAPRO) 10 MG tablet Take 1 and half tablet by mouth daily 45 tablet 2  . FINACEA 15 % cream     . Fluticasone-Salmeterol (ADVAIR DISKUS) 250-50 MCG/DOSE AEPB Inhale into the lungs.    . fluticasone-salmeterol (ADVAIR HFA) 230-21 MCG/ACT inhaler Inhale 2 puffs into the lungs 2 (two) times daily. 1 Inhaler 12  . hydrocortisone (ANUSOL-HC) 25 MG suppository Place rectally.    . metoprolol succinate (TOPROL-XL) 25 MG 24 hr tablet   3  . mometasone (NASONEX) 50 MCG/ACT nasal spray Place into the nose.    . montelukast (SINGULAIR) 10 MG tablet Take by mouth.    . MULTIPLE VITAMIN PO  Take by mouth.    . nitroGLYCERIN (NITROSTAT) 0.4 MG SL tablet Place under the tongue.    Marland Kitchen Olopatadine HCl 0.2 % SOLN Apply to eye.    Marland Kitchen omeprazole (PRILOSEC) 20 MG capsule Take by mouth.    Marland Kitchen oxycodone (OXY-IR) 5 MG capsule Take 5 mg by mouth every 4 (four) hours as needed.    . predniSONE (DELTASONE) 20 MG tablet 3 tablets (60 mg)  daily for 3 days, 2 tablets (40 mg) daily for 3 days, 1 tablet daily for 3 days, stop 18 tablet 0  . rosuvastatin (CRESTOR) 5 MG tablet Take by mouth.    . SOOLANTRA 1 % CREA     . tizanidine (ZANAFLEX) 2 MG capsule   1  . topiramate (TOPAMAX) 200 MG tablet     . traZODone (DESYREL) 100 MG tablet Take 1 tablet (100 mg total) by mouth at bedtime. 30 tablet 2  . Vitamin D, Ergocalciferol, (DRISDOL) 50000 units CAPS capsule TAKE 1 CAPSULE BY MOUTH 1 TIME A WEEK     No current facility-administered medications for this visit.     Medical Decision Making:  Established Problem, Stable/Improving (1), Review of Medication Regimen & Side Effects (2) and Review of New Medication or Change in Dosage (2)  Treatment Plan Summary:Medication management and Plan   Major depressive disorder, recurrent, moderate- Continue  Lexapro at 15mg  po qd. Continue therapy with Miguel Dibble and work on issues related to her trauma in the past.  PTSD-Lexapro as above.   Generalized anxiety disorder- Continue BuSpar to 30 mg 2 times daily.  Insomnia-  Continue trazodone at 100 mg at bedtime.    She is to return  in 2 months for a follow-up appointment and to call before if needed. Patient aware of safety plan if she has suicidal thoughts and to call 911 or drive to the nearest emergency room. Patient understands this.  [name] 03/15/2017, 9:45 AM

## 2017-03-19 NOTE — Progress Notes (Signed)
PULMONARY CONSULT NOTE  Requesting MD/Service: Hande Date of initial consultation: 03/14/17 Reason for consultation: Cough, dyspnea  PT PROFILE: 57 y.o. female smoker referred for several months of productive cough and dyspnea  HPI:  57 year old female whose current illness began in December 2018 when she developed acute onset of cough productive of yellow mucus.  At that time a chest x-ray was performed and she was diagnosed with pneumonia.  She received a course of doxycycline with minimal improvement.  She was treated with a second course of doxycycline and a repeat chest x-ray revealed improvement in pulmonary infiltrates.  However, her symptoms were not improved and she continued to have cough with discolored mucus.  She was then treated with a third antibiotic (she believes Avelox) and also received a course of prednisone.  She believes that she improved marginally but continues to have nasal and sinus congestion.  She was referred to ENT where she underwent laryngoscopy and cultures were obtained.  She also  underwent allergy testing.  She now states that she is approximately 70% improved from the onset of this illness.  However, she continues to have ear fullness bilaterally.  She also reports "asthma" symptoms with cough and wheezing.  Her cough is now productive of clear mucus.  She was started on Singulair approximately 1 month ago without significant improvement in her symptoms.  Of note, she was diagnosed with asthma in her teens.  She has been hospitalized for asthma approximately 2 years ago.  She is a smoker.  She started smoking as a young adult and quit in the 1990s.  However, she has relapsed in the past several months and presently smokes 4-5 cigarettes/day.  In addition to Singulair, she uses nebulized bronchodilators and has a rescue inhaler.  Overall, she uses rescue medicines between 4 and 5 times per month.  She denies pleuritic chest pain and hemoptysis.  She has no fevers.  She  denies lower extremity edema and calf tenderness.  Past Medical History:  Diagnosis Date  . Anxiety   . Arthritis   . Asthma   . Depression   . Diverticulitis   . Heart palpitations   . Liver disease   . Migraines   . Sleep apnea     Past Surgical History:  Procedure Laterality Date  . ABDOMINAL HYSTERECTOMY    . APPENDECTOMY    . CHOLECYSTECTOMY    . COLONOSCOPY WITH PROPOFOL N/A 04/21/2015   Procedure: COLONOSCOPY WITH PROPOFOL;  Surgeon: Manya Silvas, MD;  Location: [facility] ENDOSCOPY;  Service: Endoscopy;  Laterality: N/A;  . SINUSOTOMY    . TONSILLECTOMY      MEDICATIONS: I have reviewed all medications and confirmed regimen as documented  Social History   Socioeconomic History  . Marital status: Married    Spouse name: Journalist, newspaper  . Number of children: 0  . Years of education: Not on file  . Highest education level: High school graduate  Social Needs  . Financial resource strain: Not hard at all  . Food insecurity - worry: Never true  . Food insecurity - inability: Never true  . Transportation needs - medical: No  . Transportation needs - non-medical: No  Occupational History    Comment: disabled  Tobacco Use  . Smoking status: Former Smoker    Types: Cigarettes    Last attempt to quit: 01/15/2017    Years since quitting: 0.1  . Smokeless tobacco: Never Used  Substance and Sexual Activity  . Alcohol use: No  Alcohol/week: 0.0 oz  . Drug use: No  . Sexual activity: Yes    Birth control/protection: None  Other Topics Concern  . Not on file  Social History Narrative  . Not on file    Family History  Problem Relation Age of Onset  . Diabetes Mother   . Muscular dystrophy Mother   . Heart failure Mother   . Anxiety disorder Mother   . Skin cancer Father   . Heart disease Father   . Hypertension Father   . Depression Father   . Alcohol abuse Father   . Drug abuse Father   . Heart disease Sister   . Diabetes Sister   . Anxiety disorder Sister    . Hypertension Brother   . Diverticulosis Brother   . Heart disease Sister   . Diabetes Sister   . Heart disease Sister   . Diabetes Sister     ROS: No fever, myalgias/arthralgias, unexplained weight loss or weight gain No new focal weakness or sensory deficits No otalgia, hearing loss, visual changes, nasal and sinus symptoms, mouth and throat problems No neck pain or adenopathy No abdominal pain, N/V/D, diarrhea, change in bowel pattern No dysuria, change in urinary pattern   Vitals:   03/14/17 1049 03/14/17 1100  BP:  138/88  Pulse:  85  SpO2:  95%  Height: 5\' 5"  (1.651 m)   Room air   EXAM:  Gen:  obese, No overt respiratory distress HEENT: NCAT, sclera white, oropharynx normal Neck: Supple without LAN, thyromegaly, JVD Lungs: breath sounds are full with scattered rhonchi, no wheezes Cardiovascular: RRR, no murmurs noted Abdomen: Soft, nontender, normal BS Ext: without clubbing, cyanosis, edema Neuro: CNs grossly intact, motor and sensory intact Skin: Limited exam, no lesions noted  DATA:   BMP Latest Ref Rng & Units 01/10/2014 01/09/2014 01/08/2014  Glucose 65 - 99 mg/dL 103(H) 99 116(H)  BUN 7 - 18 mg/dL 7 9 14   Creatinine 0.60 - 1.30 mg/dL 1.04 1.30 1.05  Sodium 136 - 145 mmol/L 144 141 142  Potassium 3.5 - 5.1 mmol/L 3.6 3.8 3.4(L)  Chloride 98 - 107 mmol/L 115(H) 109(H) 109(H)  CO2 21 - 32 mmol/L 22 28 23   Calcium 8.5 - 10.1 mg/dL 7.9(L) 7.7(L) 7.6(L)    CBC Latest Ref Rng & Units 01/10/2014 01/09/2014 01/08/2014  WBC 3.6 - 11.0 x10 3/mm 3 5.0 5.3 7.2  Hemoglobin 12.0 - 16.0 g/dL 12.8 12.4 12.9  Hematocrit 35.0 - 47.0 % 39.3 38.2 40.1  Platelets 150 - 440 x10 3/mm 3 170 161 185    CXR: Images are not available to me.  Report from 01/29/17 indicates left lower lobe infiltrate consistent with pneumonia.  Report from 02/09/17 indicates resolution of infiltrate and no acute cardiac or pulmonary findings  IMPRESSION:     ICD-10-CM   1. Moderate  persistent asthma with acute exacerbation J45.41 Pulmonary Function Test [facility] Only  2. Smoker F17.200 Pulmonary Function Test [facility] Only  3. History of recent pneumonia Z87.01 DG Chest 2 View     PLAN:  Prednisone taper ordered as follows-60 mg daily for 3 days, 40 mg daily for 3 days, 20 mg daily for 3 days, stop  Initiate Advair inhaler HFA-2 actuations twice daily  Continue albuterol inhaler as needed for increased shortness of breath, wheezing, chest tightness.  Encouraged more liberal use of rescue inhaler  Continue  nebulized bronchodilator as second line rescue medicine for when albuterol inhaler fails to relieve symptoms adequately  Follow-up in  6-8 weeks with full pulmonary function tests and chest x-ray prior to that visit   Merton Border, MD PCCM service Mobile [phone] Pager [phone] 03/19/2017 3:36 PM

## 2017-04-10 ENCOUNTER — Ambulatory Visit
Admission: RE | Admit: 2017-04-10 | Discharge: 2017-04-10 | Disposition: A | Discharge status: Home / Self Care | Payer: Medicare Other | Source: Ambulatory Visit | Attending: Pulmonary Disease | Admitting: Pulmonary Disease

## 2017-04-10 ENCOUNTER — Ambulatory Visit (HOSPITAL_COMMUNITY): Payer: Medicare Other

## 2017-04-10 DIAGNOSIS — Z8701 Personal history of pneumonia (recurrent): Secondary | ICD-10-CM

## 2017-04-10 DIAGNOSIS — F17200 Nicotine dependence, unspecified, uncomplicated: Secondary | ICD-10-CM | POA: Insufficient documentation

## 2017-04-10 DIAGNOSIS — J4541 Moderate persistent asthma with (acute) exacerbation: Secondary | ICD-10-CM

## 2017-05-01 ENCOUNTER — Encounter: Payer: Self-pay | Admitting: Pulmonary Disease

## 2017-05-01 ENCOUNTER — Ambulatory Visit: Payer: Medicare Other | Admitting: Pulmonary Disease

## 2017-05-01 VITALS — BP 130/88 | HR 63 | Ht 65.0 in

## 2017-05-01 DIAGNOSIS — Z87891 Personal history of nicotine dependence: Secondary | ICD-10-CM

## 2017-05-01 DIAGNOSIS — J4530 Mild persistent asthma, uncomplicated: Secondary | ICD-10-CM | POA: Diagnosis not present

## 2017-05-01 NOTE — Patient Instructions (Signed)
Continue Advair, Singulair (montelukast), cetirizine (Zyrtec) as maintenance medications for asthma  Continue albuterol inhaler as first-line rescue medication.  I encouraged more liberal use for chest tightness, wheezing, cough, increased shortness of breath  Continue nebulized albuterol as second line rescue medication  We discussed weight loss strategies  Follow-up in 3 months or sooner as needed

## 2017-05-01 NOTE — Progress Notes (Signed)
PULMONARY OFFICE FOLLOW-UP NOTE  Requesting MD/Service: Hande Date of initial consultation: 03/14/17 Reason for consultation: Cough, dyspnea  PT PROFILE: 57 y.o. female smoker referred for several months of productive cough and dyspnea  DATA: 04/10/17 PFTs: no definite obstruction. Normal lung volumes. DLCO moderately low but corrects to normal for [facility]  SUBJ:  This is a routine reevaluation.  Last visit, I prescribed a prednisone taper and we initiated an Advair inhaler.  I also ordered pony function test.  She returns today to review PFTs and response to the above interventions.  Since last seen, she has experienced increased respiratory symptoms with cough and shortness of breath.  She was seen by Dr. Ginette Pitman last week who prescribed an abx and a course of pred. She feels that she has improved. Presently she denies CP, fever, purulent sputum, hemoptysis, LE edema and calf tenderness.   Vitals:   05/01/17 0821 05/01/17 0826  BP:  130/88  Pulse:  63  SpO2:  98%  Height: 5\' 5"  (1.651 m)   Room air   EXAM:   Gen: WDWN in NAD HEENT: NCAT, sclerae white, oropharynx normal Neck: No LAN, no JVD noted Lungs: full BS, normal percussion note throughout, no adventitious sounds Cardiovascular: Regular, normal rate, no M noted Abdomen: Soft, NT, +BS Ext: no C/C/E Neuro: PERRL, EOMI, motor/sensory grossly intact Skin: No lesions noted   DATA:   BMP Latest Ref Rng & Units 01/10/2014 01/09/2014 01/08/2014  Glucose 65 - 99 mg/dL 103(H) 99 116(H)  BUN 7 - 18 mg/dL 7 9 14   Creatinine 0.60 - 1.30 mg/dL 1.04 1.30 1.05  Sodium 136 - 145 mmol/L 144 141 142  Potassium 3.5 - 5.1 mmol/L 3.6 3.8 3.4(L)  Chloride 98 - 107 mmol/L 115(H) 109(H) 109(H)  CO2 21 - 32 mmol/L 22 28 23   Calcium 8.5 - 10.1 mg/dL 7.9(L) 7.7(L) 7.6(L)    CBC Latest Ref Rng & Units 01/10/2014 01/09/2014 01/08/2014  WBC 3.6 - 11.0 x10 3/mm 3 5.0 5.3 7.2  Hemoglobin 12.0 - 16.0 g/dL 12.8 12.4 12.9  Hematocrit 35.0 - 47.0 %  39.3 38.2 40.1  Platelets 150 - 440 x10 3/mm 3 170 161 185    CXR: Images are not available to me.  Report from 01/29/17 indicates left lower lobe infiltrate consistent with pneumonia.  Report from 02/09/17 indicates resolution of infiltrate and no acute cardiac or pulmonary findings  IMPRESSION:     ICD-10-CM   1. Mild persistent asthma without complication B09.62   2. Severe obesity E66.01   3. Former smoker Z87.891      PLAN:  Continue Advair, Singulair (montelukast), cetirizine (Zyrtec) as maintenance medications for asthma  Continue albuterol inhaler as first-line rescue medication.  I encouraged more liberal use for chest tightness, wheezing, cough, increased shortness of breath  Continue nebulized albuterol as second line rescue medication  We discussed weight loss strategies  Follow-up in 3 months or sooner as needed   Merton Border, MD PCCM service Mobile [phone] Pager [phone] 05/03/2017 2:30 PM

## 2017-05-10 ENCOUNTER — Ambulatory Visit: Payer: Medicare Other | Admitting: Psychiatry

## 2017-05-10 ENCOUNTER — Other Ambulatory Visit: Payer: Self-pay

## 2017-05-10 ENCOUNTER — Encounter: Payer: Self-pay | Admitting: Psychiatry

## 2017-05-10 VITALS — BP 117/78 | HR 85 | Temp 98.1°F | Wt 299.8 lb

## 2017-05-10 DIAGNOSIS — F331 Major depressive disorder, recurrent, moderate: Secondary | ICD-10-CM | POA: Diagnosis not present

## 2017-05-10 DIAGNOSIS — F4310 Post-traumatic stress disorder, unspecified: Secondary | ICD-10-CM | POA: Diagnosis not present

## 2017-05-10 DIAGNOSIS — F411 Generalized anxiety disorder: Secondary | ICD-10-CM

## 2017-05-10 NOTE — Progress Notes (Signed)
Patient ID: [name], female   DOB: [birth year], 57 y.o.   MRN: [id number]   [facility] MD/PA/NP OP Progress Note  05/10/2017 10:13 AM [name]  MRN:  [id number]  Subjective:  Patient returns for follow-up of her major depressive disorder, generalized anxiety disorder and grief. Patient today reports that she is doing okay. States that things are better with the husband. She struggled trying to come to terms with her father's death. She has flashbacks now and then. States that she sleeps for about 2 hours and wakes up some nights. Takes trazodone as needed. She denies any suicidal thoughts.  She continues to see Miguel Dibble.  Chief Complaint: doing okay Chief Complaint    Follow-up; Medication Refill     Visit Diagnosis:     ICD-10-CM   1. GAD (generalized anxiety disorder) F41.1   2. Post traumatic stress disorder (PTSD) F43.10   3. MDD (major depressive disorder), recurrent episode, moderate ([place]) F33.1     Past Medical History:  Past Medical History:  Diagnosis Date  . Anxiety   . Arthritis   . Asthma   . Depression   . Diverticulitis   . Heart palpitations   . Liver disease   . Migraines   . Sleep apnea     Past Surgical History:  Procedure Laterality Date  . ABDOMINAL HYSTERECTOMY    . APPENDECTOMY    . CHOLECYSTECTOMY    . COLONOSCOPY WITH PROPOFOL N/A 04/21/2015   Procedure: COLONOSCOPY WITH PROPOFOL;  Surgeon: Manya Silvas, MD;  Location: [facility] ENDOSCOPY;  Service: Endoscopy;  Laterality: N/A;  . SINUSOTOMY    . TONSILLECTOMY     Family History:  Family History  Problem Relation Age of Onset  . Diabetes Mother   . Muscular dystrophy Mother   . Heart failure Mother   . Anxiety disorder Mother   . Skin cancer Father   . Heart disease Father   . Hypertension Father   . Depression Father   . Alcohol abuse Father   . Drug abuse Father   . Heart disease Sister   . Diabetes Sister   . Anxiety disorder Sister   . Hypertension Brother   . Diverticulosis  Brother   . Heart disease Sister   . Diabetes Sister   . Heart disease Sister   . Diabetes Sister    Social History:  Social History   Socioeconomic History  . Marital status: Married    Spouse name: Journalist, newspaper  . Number of children: 0  . Years of education: Not on file  . Highest education level: High school graduate  Occupational History    Comment: disabled  Social Needs  . Financial resource strain: Not hard at all  . Food insecurity:    Worry: Never true    Inability: Never true  . Transportation needs:    Medical: No    Non-medical: No  Tobacco Use  . Smoking status: Former Smoker    Types: Cigarettes    Last attempt to quit: 01/15/2017    Years since quitting: 0.3  . Smokeless tobacco: Never Used  Substance and Sexual Activity  . Alcohol use: No    Alcohol/week: 0.0 oz  . Drug use: No  . Sexual activity: Yes    Birth control/protection: None  Lifestyle  . Physical activity:    Days per week: 0 days    Minutes per session: 0 min  . Stress: Rather much  Relationships  . Social connections:  Talks on phone: More than three times a week    Gets together: Once a week    Attends religious service: Never    Active member of club or organization: No    Attends meetings of clubs or organizations: Never    Relationship status: Married  Other Topics Concern  . Not on file  Social History Narrative  . Not on file   Additional History:   Assessment:   Musculoskeletal: Strength & Muscle Tone: within normal limits Gait & Station: normal Patient leans: N/A  Psychiatric Specialty Exam: Medication Refill   Anxiety  Patient reports no insomnia, nervous/anxious behavior or suicidal ideas.    Depression         Associated symptoms include does not have insomnia and no suicidal ideas.  Past medical history includes anxiety.     Review of Systems  Psychiatric/Behavioral: Negative for depression, hallucinations, memory loss, substance abuse and suicidal ideas.  The patient is not nervous/anxious and does not have insomnia.   All other systems reviewed and are negative.   Blood pressure 117/78, pulse 85, temperature 98.1 F (36.7 C), temperature source Oral, weight 136 kg (299 lb 12.8 oz).Body mass index is 49.89 kg/m.  General Appearance: Neat and Well Groomed  Eye Contact:  Good  Speech:  Normal Rate  Volume:  Normal  Mood:  Ok, dealing with father`s death  Affect: Pleasant   Thought Process:  Linear and Logical  Orientation:  Full (Time, Place, and Person)  Thought Content:  Negative  Suicidal Thoughts: denies  Homicidal Thoughts:  No  Memory:  Immediate;   Good Recent;   Good Remote;   Good  Judgement:  Good  Insight:  Good  Psychomotor Activity:  Negative  Concentration:  Good  Recall:  Good  Fund of Knowledge: Negative  Language: Good  Akathisia:  Negative  Handed:  Right  AIMS (if indicated):  na  Assets:  Communication Skills Desire for Improvement Social Support  ADL's:  Intact  Cognition: WNL  Sleep:  better   Is the patient at risk to self?  No. Has the patient been a risk to self in the past 6 months?  No. Has the patient been a risk to self within the distant past?  No. Is the patient a risk to others?  No. Has the patient been a risk to others in the past 6 months?  No. Has the patient been a risk to others within the distant past?  No.  Current Medications: Current Outpatient Medications  Medication Sig Dispense Refill  . amLODipine (NORVASC) 5 MG tablet Take 5 mg by mouth 1 day or 1 dose.    Marland Kitchen aspirin EC 81 MG tablet Take 81 mg by mouth daily.    . busPIRone (BUSPAR) 30 MG tablet Take 1 tablet (30 mg total) by mouth 2 (two) times daily. 60 tablet 2  . cetirizine (ZYRTEC ALLERGY) 10 MG tablet Take by mouth.    . escitalopram (LEXAPRO) 10 MG tablet Take 1 and half tablet by mouth daily 45 tablet 2  . FINACEA 15 % cream     . fluticasone-salmeterol (ADVAIR HFA) 230-21 MCG/ACT inhaler Inhale 2 puffs into the  lungs 2 (two) times daily. 1 Inhaler 12  . hydrocortisone (ANUSOL-HC) 25 MG suppository Place rectally.    . metoprolol succinate (TOPROL-XL) 25 MG 24 hr tablet   3  . mometasone (NASONEX) 50 MCG/ACT nasal spray Place into the nose.    . montelukast (SINGULAIR) 10 MG tablet  Take by mouth.    . MULTIPLE VITAMIN PO Take by mouth.    . nitroGLYCERIN (NITROSTAT) 0.4 MG SL tablet Place under the tongue.    Marland Kitchen Olopatadine HCl 0.2 % SOLN Apply to eye.    Marland Kitchen omeprazole (PRILOSEC) 20 MG capsule Take by mouth.    Marland Kitchen oxycodone (OXY-IR) 5 MG capsule Take 5 mg by mouth every 4 (four) hours as needed.    . rosuvastatin (CRESTOR) 5 MG tablet Take by mouth.    . SOOLANTRA 1 % CREA     . tizanidine (ZANAFLEX) 2 MG capsule   1  . topiramate (TOPAMAX) 200 MG tablet     . traZODone (DESYREL) 100 MG tablet Take 1 tablet (100 mg total) by mouth at bedtime. 30 tablet 2  . Vitamin D, Ergocalciferol, (DRISDOL) 50000 units CAPS capsule TAKE 1 CAPSULE BY MOUTH 1 TIME A WEEK     No current facility-administered medications for this visit.     Medical Decision Making:  Established Problem, Stable/Improving (1), Review of Medication Regimen & Side Effects (2) and Review of New Medication or Change in Dosage (2)  Treatment Plan Summary:Medication management and Plan   Major depressive disorder, recurrent, moderate- Continue  Lexapro at 15mg  po qd. Continue therapy with Miguel Dibble and work on issues related to her trauma in the past.  PTSD-Lexapro as above.   Generalized anxiety disorder- Continue BuSpar to 30 mg 2 times daily.  Insomnia-  Continue trazodone at 100 mg at bedtime.    She is to return  in 3 months for a follow-up appointment and to call before if needed. Patient aware of safety plan if she has suicidal thoughts and to call 911 or drive to the nearest emergency room. Patient understands this.  Leotis Isham 05/10/2017, 10:13 AM

## 2017-05-23 ENCOUNTER — Emergency Department
Admission: EM | Admit: 2017-05-23 | Discharge: 2017-05-23 | Disposition: A | Discharge status: Home / Self Care | Payer: Medicare Other | Attending: Student in an Organized Health Care Education/Training Program | Admitting: Student in an Organized Health Care Education/Training Program

## 2017-05-23 ENCOUNTER — Emergency Department: Payer: Medicare Other

## 2017-05-23 ENCOUNTER — Encounter: Payer: Self-pay | Admitting: Emergency Medicine

## 2017-05-23 ENCOUNTER — Other Ambulatory Visit: Payer: Self-pay

## 2017-05-23 DIAGNOSIS — R1084 Generalized abdominal pain: Secondary | ICD-10-CM | POA: Insufficient documentation

## 2017-05-23 DIAGNOSIS — R112 Nausea with vomiting, unspecified: Secondary | ICD-10-CM | POA: Insufficient documentation

## 2017-05-23 DIAGNOSIS — E860 Dehydration: Secondary | ICD-10-CM | POA: Diagnosis not present

## 2017-05-23 DIAGNOSIS — R197 Diarrhea, unspecified: Secondary | ICD-10-CM | POA: Diagnosis not present

## 2017-05-23 DIAGNOSIS — Z7982 Long term (current) use of aspirin: Secondary | ICD-10-CM | POA: Insufficient documentation

## 2017-05-23 DIAGNOSIS — I119 Hypertensive heart disease without heart failure: Secondary | ICD-10-CM | POA: Insufficient documentation

## 2017-05-23 DIAGNOSIS — I2510 Atherosclerotic heart disease of native coronary artery without angina pectoris: Secondary | ICD-10-CM | POA: Insufficient documentation

## 2017-05-23 DIAGNOSIS — Z87891 Personal history of nicotine dependence: Secondary | ICD-10-CM | POA: Diagnosis not present

## 2017-05-23 DIAGNOSIS — J45909 Unspecified asthma, uncomplicated: Secondary | ICD-10-CM | POA: Insufficient documentation

## 2017-05-23 DIAGNOSIS — Z79899 Other long term (current) drug therapy: Secondary | ICD-10-CM | POA: Insufficient documentation

## 2017-05-23 LAB — URINALYSIS, COMPLETE (UACMP) WITH MICROSCOPIC
BACTERIA UA: NONE SEEN
Bilirubin Urine: NEGATIVE
Glucose, UA: NEGATIVE mg/dL
HGB URINE DIPSTICK: NEGATIVE
KETONES UR: 20 mg/dL — AB
LEUKOCYTES UA: NEGATIVE
Nitrite: NEGATIVE
PH: 5 (ref 5.0–8.0)
Protein, ur: 30 mg/dL — AB
SPECIFIC GRAVITY, URINE: 1.029 (ref 1.005–1.030)
SQUAMOUS EPITHELIAL / LPF: NONE SEEN (ref 0–5)
WBC, UA: NONE SEEN WBC/hpf (ref 0–5)

## 2017-05-23 LAB — COMPREHENSIVE METABOLIC PANEL
ALBUMIN: 4 g/dL (ref 3.5–5.0)
ALT: 21 U/L (ref 14–54)
AST: 22 U/L (ref 15–41)
Alkaline Phosphatase: 98 U/L (ref 38–126)
Anion gap: 8 (ref 5–15)
BUN: 16 mg/dL (ref 6–20)
CALCIUM: 8.6 mg/dL — AB (ref 8.9–10.3)
CHLORIDE: 109 mmol/L (ref 101–111)
CO2: 18 mmol/L — ABNORMAL LOW (ref 22–32)
Creatinine, Ser: 0.98 mg/dL (ref 0.44–1.00)
GFR calc Af Amer: >60 mL/min (ref >60)
GFR calc non Af Amer: >60 mL/min (ref >60)
GLUCOSE: 140 mg/dL — AB (ref 65–99)
POTASSIUM: 3.3 mmol/L — AB (ref 3.5–5.1)
Sodium: 135 mmol/L (ref 135–145)
TOTAL PROTEIN: 7.8 g/dL (ref 6.5–8.1)
Total Bilirubin: 0.6 mg/dL (ref 0.3–1.2)

## 2017-05-23 LAB — CBC
HEMATOCRIT: 46.7 % (ref 35.0–47.0)
HEMOGLOBIN: 15.5 g/dL (ref 12.0–16.0)
MCH: 28.7 pg (ref 26.0–34.0)
MCHC: 33.2 g/dL (ref 32.0–36.0)
MCV: 86.4 fL (ref 80.0–100.0)
Platelets: 215 10*3/uL (ref 150–440)
RBC: 5.41 MIL/uL — ABNORMAL HIGH (ref 3.80–5.20)
RDW: 15.4 % — AB (ref 11.5–14.5)
WBC: 8.6 10*3/uL (ref 3.6–11.0)

## 2017-05-23 LAB — GASTROINTESTINAL PANEL BY PCR, STOOL (REPLACES STOOL CULTURE)
ASTROVIRUS: NOT DETECTED
Adenovirus F40/41: NOT DETECTED
CYCLOSPORA CAYETANENSIS: NOT DETECTED
Campylobacter species: NOT DETECTED
Cryptosporidium: NOT DETECTED
ENTAMOEBA HISTOLYTICA: NOT DETECTED
ENTEROAGGREGATIVE E COLI (EAEC): NOT DETECTED
ENTEROPATHOGENIC E COLI (EPEC): NOT DETECTED
ENTEROTOXIGENIC E COLI (ETEC): NOT DETECTED
Giardia lamblia: NOT DETECTED
NOROVIRUS GI/GII: NOT DETECTED
Plesimonas shigelloides: NOT DETECTED
Rotavirus A: DETECTED — AB
SAPOVIRUS (I, II, IV, AND V): NOT DETECTED
SHIGA LIKE TOXIN PRODUCING E COLI (STEC): NOT DETECTED
Salmonella species: NOT DETECTED
Shigella/Enteroinvasive E coli (EIEC): NOT DETECTED
VIBRIO CHOLERAE: NOT DETECTED
VIBRIO SPECIES: NOT DETECTED
Yersinia enterocolitica: NOT DETECTED

## 2017-05-23 LAB — C DIFFICILE QUICK SCREEN W PCR REFLEX
C DIFFICLE (CDIFF) ANTIGEN: NEGATIVE
C Diff interpretation: NOT DETECTED
C Diff toxin: NEGATIVE

## 2017-05-23 LAB — LIPASE, BLOOD: LIPASE: 25 U/L (ref 11–51)

## 2017-05-23 MED ORDER — METOCLOPRAMIDE HCL 10 MG PO TABS: 10.0000 mg | ORAL_TABLET | Freq: Four times a day (QID) | ORAL | 0 refills | Status: DC | PRN

## 2017-05-23 MED ORDER — METOCLOPRAMIDE HCL 5 MG/ML IJ SOLN
10.0000 mg | Freq: Once | INTRAMUSCULAR | Status: AC
  Administered 2017-05-23: 10 mg via INTRAVENOUS
  Filled 2017-05-23: qty 2

## 2017-05-23 MED ORDER — SODIUM CHLORIDE 0.9 % IV BOLUS
1000.0000 mL | Freq: Once | INTRAVENOUS | Status: AC
  Administered 2017-05-23: 1000 mL via INTRAVENOUS

## 2017-05-23 MED ORDER — MORPHINE SULFATE (PF) 4 MG/ML IV SOLN: 4.0000 mg | INTRAVENOUS | Status: DC | PRN

## 2017-05-23 MED ORDER — ONDANSETRON 4 MG PO TBDP
4.0000 mg | ORAL_TABLET | Freq: Once | ORAL | Status: AC
  Administered 2017-05-23: 4 mg via ORAL

## 2017-05-23 MED ORDER — ONDANSETRON 4 MG PO TBDP
ORAL_TABLET | ORAL | Status: AC
  Filled 2017-05-23: qty 1

## 2017-05-23 MED ORDER — SODIUM CHLORIDE 0.9 % IV BOLUS
500.0000 mL | Freq: Once | INTRAVENOUS | Status: AC
  Administered 2017-05-23: 500 mL via INTRAVENOUS

## 2017-05-23 NOTE — ED Notes (Signed)
Patient transported to CT 

## 2017-05-23 NOTE — ED Notes (Signed)

## 2017-05-23 NOTE — ED Provider Notes (Signed)
[facility] Emergency Department Provider Note    First MD Initiated Contact with Patient 05/23/17 1455     (approximate)  I have reviewed the triage vital signs and the nursing notes.   HISTORY  Chief Complaint Diarrhea and Emesis    HPI [name] is a 57 y.o. female with a reported history of liver disease reported history of CHF diverticulitis presents to the ER with 4 days of intractable nausea vomiting and diarrhea.  Denies any blood in her stools or melena.  States is been primarily watery diarrhea and states it started after she ate salad with bacon bits that she thinks tasted funny.  No else at home is been sick.  No recent antibiotics.  Has had multiple abdominal surgeries and feels that the pain is worsening in concerning for diverticulitis again.  Is not been able to keep any food down.  Denies any pain ripping or tearing through to her back.  No dysuria.  No history of kidney stones.  Past Medical History:  Diagnosis Date  . Anxiety   . Arthritis   . Asthma   . Depression   . Diverticulitis   . Heart palpitations   . Liver disease   . Migraines   . Sleep apnea    Family History  Problem Relation Age of Onset  . Diabetes Mother   . Muscular dystrophy Mother   . Heart failure Mother   . Anxiety disorder Mother   . Skin cancer Father   . Heart disease Father   . Hypertension Father   . Depression Father   . Alcohol abuse Father   . Drug abuse Father   . Heart disease Sister   . Diabetes Sister   . Anxiety disorder Sister   . Hypertension Brother   . Diverticulosis Brother   . Heart disease Sister   . Diabetes Sister   . Heart disease Sister   . Diabetes Sister    Past Surgical History:  Procedure Laterality Date  . ABDOMINAL HYSTERECTOMY    . APPENDECTOMY    . CHOLECYSTECTOMY    . COLONOSCOPY WITH PROPOFOL N/A 04/21/2015   Procedure: COLONOSCOPY WITH PROPOFOL;  Surgeon: Manya Silvas, MD;  Location[facility] ENDOSCOPY;   Service: Endoscopy;  Laterality: N/A;  . SINUSOTOMY    . TONSILLECTOMY     Patient Active Problem List   Diagnosis Date Noted  . Heart disease 11/06/2016  . Hypertensive disorder 11/06/2016  . Hypercholesterolemia 11/06/2016  . Fibromyalgia 08/22/2016  . Coronary artery disease involving native coronary artery of native heart without angina pectoris 08/22/2016  . Essential hypertension 10/21/2015  . Morbid (severe) obesity due to excess calories ([place]) 02/25/2015  . Morbid obesity with BMI of 45.0-49.9, adult ([place]) 02/25/2015  . Obstructive apnea 10/14/2014  . Breathlessness on exertion 10/14/2014  . Major depressive disorder, recurrent episode, moderate ([place]) 09/01/2014  . Post traumatic stress disorder (PTSD) 09/01/2014  . Depression, major, severe recurrence ([place]) 07/31/2014  . Agoraphobia with panic disorder 07/31/2014  . 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 Major depressive disorder, recurrent episode, severe ([place]) 07/31/2014  . Allergic rhinitis 07/31/2014  . History of asthma 07/31/2014  . Gastroduodenal ulcer 07/31/2014  . History of migraine headaches 07/31/2014  . Insomnia, persistent 07/31/2014  . History of colitis 07/31/2014  . DD (diverticular disease) 07/31/2014  . Endometriosis 07/31/2014  . Anxiety, generalized 07/31/2014  . Acid reflux 07/31/2014  . Aggrieved 07/31/2014  . Depression, major, recurrent, moderate ([place]) 07/31/2014  .  H/O: obesity 07/31/2014  . Arthritis, degenerative 07/31/2014  . Arthritis of knee, degenerative 02/20/2014  . Knee torn cartilage 02/20/2014  . Anxiety 05/30/2013  . Adiposity 05/30/2013  . Clinical depression 05/30/2013  . Airway hyperreactivity 04/12/2011  . Apnea, sleep 04/12/2011  . Chest pain 04/12/2011  . Awareness of heartbeats 04/12/2011      Prior to Admission medications   Medication Sig Start Date End Date Taking? Authorizing Provider  amLODipine (NORVASC) 5 MG tablet Take 5 mg by mouth daily.  10/20/15  Yes  [provider]  aspirin EC 81 MG tablet Take 81 mg by mouth daily.   Yes [provider]  busPIRone (BUSPAR) 15 MG tablet Take 1 tablet by mouth QID. 05/12/17  Yes [provider]  escitalopram (LEXAPRO) 10 MG tablet Take 1 and half tablet by mouth daily Patient taking differently: Take 15 mg by mouth daily.  03/15/17  Yes Ravi, Himabindu, MD  fluticasone-salmeterol (ADVAIR HFA) 230-21 MCG/ACT inhaler Inhale 2 puffs into the lungs 2 (two) times daily. 03/14/17  Yes Wilhelmina Mcardle, MD  metoprolol succinate (TOPROL-XL) 25 MG 24 hr tablet Take 12.5 mg by mouth daily.  07/24/16  Yes [provider]  montelukast (SINGULAIR) 10 MG tablet Take 10 mg by mouth at bedtime.   Yes [provider]  rosuvastatin (CRESTOR) 5 MG tablet Take 5 mg by mouth daily.  07/24/16 07/24/17 Yes [provider]  topiramate (TOPAMAX) 200 MG tablet Take 200 mg by mouth daily.  07/23/14  Yes [provider]  Vitamin D, Ergocalciferol, (DRISDOL) 50000 units CAPS capsule TAKE 1 CAPSULE BY MOUTH 1 TIME A WEEK 05/21/15  Yes [provider]  metoCLOPramide (REGLAN) 10 MG tablet Take 1 tablet (10 mg total) by mouth every 6 (six) hours as needed for nausea. 05/23/17 05/23/18  Merlyn Lot, MD  nitroGLYCERIN (NITROSTAT) 0.4 MG SL tablet Place 0.4 mg under the tongue every 5 (five) minutes as needed.  06/28/16 06/28/17  [provider]  traZODone (DESYREL) 100 MG tablet Take 1 tablet (100 mg total) by mouth at bedtime. Patient not taking: Reported on 05/23/2017 03/15/17   Elvin So, MD    Allergies Cefotetan; Iodinated diagnostic agents; Other; Promethazine; Promethazine hcl; Propoxyphene; Propoxyphene; Sulfa antibiotics; Penicillins; Saccharin; Aminoglycosides; Cefuroxime; Eggs or egg-derived products; Influenza vaccines; Latex; and Red dye    Social History Social History   Tobacco Use  . Smoking status: Former Smoker    Types: Cigarettes    Last attempt  to quit: 01/15/2017    Years since quitting: 0.3  . Smokeless tobacco: Never Used  Substance Use Topics  . Alcohol use: No    Alcohol/week: 0.0 oz  . Drug use: No    Review of Systems Patient denies headaches, rhinorrhea, blurry vision, numbness, shortness of breath, chest pain, edema, cough, abdominal pain, nausea, vomiting, diarrhea, dysuria, fevers, rashes or hallucinations unless otherwise stated above in HPI. ____________________________________________   PHYSICAL EXAM:  VITAL SIGNS: Vitals:   05/23/17 1843 05/23/17 1923  BP:    Pulse: 76 81  Resp:    Temp:    SpO2: 96% 96%    Constitutional: Alert and oriented. in no acute distress. Eyes: Conjunctivae are normal.  Head: Atraumatic. Nose: No congestion/rhinnorhea. Mouth/Throat: Mucous membranes are moist.   Neck: No stridor. Painless ROM.  Cardiovascular: Normal rate, regular rhythm. Grossly normal heart sounds.  Good peripheral circulation. Respiratory: Normal respiratory effort.  No retractions. Lungs CTAB. Gastrointestinal: obese soft and nontender. No distention. No abdominal  bruits. No CVA tenderness. Genitourinary: deferred Musculoskeletal: No lower extremity tenderness nor edema.  No joint effusions. Neurologic:  Normal speech and language. No gross focal neurologic deficits are appreciated. No facial droop Skin:  Skin is warm, dry and intact. No rash noted. Psychiatric: Mood and affect are normal. Speech and behavior are normal.  ____________________________________________   LABS (all labs ordered are listed, but only abnormal results are displayed)  Results for orders placed or performed during the hospital encounter of 05/23/17 (from the past 24 hour(s))  Lipase, blood     Status: None   Collection Time: 05/23/17 12:27 PM  Result Value Ref Range   Lipase 25 11 - 51 U/L  Comprehensive metabolic panel     Status: Abnormal   Collection Time: 05/23/17 12:27 PM  Result Value Ref Range   Sodium 135 135  - 145 mmol/L   Potassium 3.3 (L) 3.5 - 5.1 mmol/L   Chloride 109 101 - 111 mmol/L   CO2 18 (L) 22 - 32 mmol/L   Glucose, Bld 140 (H) 65 - 99 mg/dL   BUN 16 6 - 20 mg/dL   Creatinine, Ser 0.98 0.44 - 1.00 mg/dL   Calcium 8.6 (L) 8.9 - 10.3 mg/dL   Total Protein 7.8 6.5 - 8.1 g/dL   Albumin 4.0 3.5 - 5.0 g/dL   AST 22 15 - 41 U/L   ALT 21 14 - 54 U/L   Alkaline Phosphatase 98 38 - 126 U/L   Total Bilirubin 0.6 0.3 - 1.2 mg/dL   GFR calc non Af Amer >60 >60 mL/min   GFR calc Af Amer >60 >60 mL/min   Anion gap 8 5 - 15  CBC     Status: Abnormal   Collection Time: 05/23/17 12:27 PM  Result Value Ref Range   WBC 8.6 3.6 - 11.0 K/uL   RBC 5.41 (H) 3.80 - 5.20 MIL/uL   Hemoglobin 15.5 12.0 - 16.0 g/dL   HCT 46.7 35.0 - 47.0 %   MCV 86.4 80.0 - 100.0 fL   MCH 28.7 26.0 - 34.0 pg   MCHC 33.2 32.0 - 36.0 g/dL   RDW 15.4 (H) 11.5 - 14.5 %   Platelets 215 150 - 440 K/uL  Urinalysis, Complete w Microscopic     Status: Abnormal   Collection Time: 05/23/17  6:33 PM  Result Value Ref Range   Color, Urine YELLOW (A) YELLOW   APPearance TURBID (A) CLEAR   Specific Gravity, Urine 1.029 1.005 - 1.030   pH 5.0 5.0 - 8.0   Glucose, UA NEGATIVE NEGATIVE mg/dL   Hgb urine dipstick NEGATIVE NEGATIVE   Bilirubin Urine NEGATIVE NEGATIVE   Ketones, ur 20 (A) NEGATIVE mg/dL   Protein, ur 30 (A) NEGATIVE mg/dL   Nitrite NEGATIVE NEGATIVE   Leukocytes, UA NEGATIVE NEGATIVE   WBC, UA NONE SEEN 0 - 5 WBC/hpf   Bacteria, UA NONE SEEN NONE SEEN   Squamous Epithelial / LPF NONE SEEN 0 - 5   Mucus PRESENT    ____________________________________________ ____________________________________________  RADIOLOGY  I personally reviewed all radiographic images ordered to evaluate for the above acute complaints and reviewed radiology reports and findings.  These findings were personally discussed with the patient.  Please see medical record for radiology  report.  ____________________________________________   PROCEDURES  Procedure(s) performed:  Procedures    Critical Care performed: no ____________________________________________   INITIAL IMPRESSION / ASSESSMENT AND PLAN / ED COURSE  Pertinent labs & imaging results that were available  during my care of the patient were reviewed by me and considered in my medical decision making (see chart for details).  DDX: diverticulitis, sbo, gastroparesis, megacolon, enteritis  MILLEY VINING is a 57 y.o. who presents to the ED with nausea vomiting diarrhea crampy generalized abdominal pain.  Has not been able to keep much down.  Blood work sent for the above differential shows no leukocytosis but mild dehydration likely secondary to GI losses.  No AKI.  Urinalysis shows no evidence of infection.  Patient was unable to give a stool sample while in the ER for over 6 hours to suggest against C. difficile.  Patient able to tolerate oral hydration after IV fluids and IV pain medication and IV antiemetics.  CT imaging shows no evidence of diverticulitis or perforation.  Probable enteritis.  At this point do believe patient stable and appropriate for outpatient follow-up.      As part of my medical decision making, I reviewed the following data within the [place] notes reviewed and incorporated, Labs reviewed, notes from prior ED visits.   ____________________________________________   FINAL CLINICAL IMPRESSION(S) / ED DIAGNOSES  Final diagnoses:  Nausea vomiting and diarrhea  Dehydration      NEW MEDICATIONS STARTED DURING THIS VISIT:  New Prescriptions   METOCLOPRAMIDE (REGLAN) 10 MG TABLET    Take 1 tablet (10 mg total) by mouth every 6 (six) hours as needed for nausea.     Note:  This document was prepared using Dragon voice recognition software and may include unintentional dictation errors.    Merlyn Lot, MD 05/23/17 Sharilyn Sites

## 2017-05-23 NOTE — ED Notes (Signed)
Pt reports N/V/D since Monday at 0200. Pt states she would have interm abd pain (generalized per pt) after mult episodes of emesis. Pt states hx of diverticulosis. Pt denies fever.

## 2017-05-23 NOTE — ED Triage Notes (Signed)
Pt in via POV with complaints of generalized abdominal pain w/ N/V/D since Monday.  Vitals WDL, NAD noted at this time.

## 2017-05-23 NOTE — ED Notes (Signed)
Pt signed hardcopy of ED discharge due to signature pad not responding, it was placed on pt's chart

## 2017-05-24 ENCOUNTER — Telehealth: Payer: Self-pay | Admitting: Emergency Medicine

## 2017-05-24 NOTE — Telephone Encounter (Signed)
Called patient to give results of stool pcr.  No change in treatment.  Gave her results.  Told her to return or call her doctor if worse or feels dehydrated.

## 2017-07-31 ENCOUNTER — Encounter: Payer: Self-pay | Admitting: Pulmonary Disease

## 2017-07-31 ENCOUNTER — Ambulatory Visit: Payer: Medicare Other | Admitting: Pulmonary Disease

## 2017-07-31 VITALS — BP 138/88 | HR 74 | Ht 65.0 in | Wt 297.0 lb

## 2017-07-31 DIAGNOSIS — R0609 Other forms of dyspnea: Secondary | ICD-10-CM | POA: Diagnosis not present

## 2017-07-31 DIAGNOSIS — G4733 Obstructive sleep apnea (adult) (pediatric): Secondary | ICD-10-CM

## 2017-07-31 DIAGNOSIS — J4530 Mild persistent asthma, uncomplicated: Secondary | ICD-10-CM

## 2017-07-31 DIAGNOSIS — Z87891 Personal history of nicotine dependence: Secondary | ICD-10-CM

## 2017-07-31 NOTE — Progress Notes (Signed)
PULMONARY OFFICE FOLLOW-UP NOTE  Requesting MD/Service: Hande Date of initial consultation: 03/14/17 Reason for consultation: Cough, dyspnea  PT PROFILE: 57 y.o. female smoker referred for several months of productive cough and dyspnea  DATA: 04/10/17 PFTs: no definite obstruction. Normal lung volumes. DLCO moderately low but corrects to normal for [facility]  INTERVAL: Last seen 4/16. No major events  SUBJ:  This is a scheduled reevaluation.  She continues to have moderate exertional dyspnea which is exacerbated by the hot humid weather.  She remains on Advair, montelukast, cetirizine.  She is using her albuterol rescue inhaler approximately 1 time per day.   She is treated for obstructive sleep apnea (CPAP x4-5 years) but reports that she continues to snore at night despite the CPAP.  She has occasional morning headache.  She denies significant daytime hypersomnolence.  Vitals:   07/31/17 1010 07/31/17 1014  BP:  138/88  Pulse:  74  SpO2:  98%  Weight: 297 lb (134.7 kg)   Height: 5\' 5"  (1.651 m)   Room air  EXAM:  Gen: Very obese, no overt distress HEENT: NCAT, sclerae white Neck: JVP not visualized Lungs: Full BS without adventitious sounds Cardiovascular: Regular, no M Abdomen: Obese, soft, NT, + BS Ext: Trace symmetric ankle edema Neuro: No focal deficits   DATA:   BMP Latest Ref Rng & Units 05/23/2017 01/10/2014 01/09/2014  Glucose 65 - 99 mg/dL 140(H) 103(H) 99  BUN 6 - 20 mg/dL 16 7 9   Creatinine 0.44 - 1.00 mg/dL 0.98 1.04 1.30  Sodium 135 - 145 mmol/L 135 144 141  Potassium 3.5 - 5.1 mmol/L 3.3(L) 3.6 3.8  Chloride 101 - 111 mmol/L 109 115(H) 109(H)  CO2 22 - 32 mmol/L 18(L) 22 28  Calcium 8.9 - 10.3 mg/dL 8.6(L) 7.9(L) 7.7(L)    CBC Latest Ref Rng & Units 05/23/2017 01/10/2014 01/09/2014  WBC 3.6 - 11.0 K/uL 8.6 5.0 5.3  Hemoglobin 12.0 - 16.0 g/dL 15.5 12.8 12.4  Hematocrit 35.0 - 47.0 % 46.7 39.3 38.2  Platelets 150 - 440 K/uL 215 170 161    CXR 3/26: No  acute cardiopulmonary disease  IMPRESSION:     ICD-10-CM   1. Former smoker Z87.891   2. Mild persistent asthma without complication Z22.48   3. Severe obesity E66.01   4. DOE (dyspnea on exertion) R06.09    The major contributor to her exertional dyspnea is obesity.  PLAN:  Continue Advair, Singulair (montelukast), cetirizine (Zyrtec) as maintenance medications for asthma  Continue albuterol inhaler as first-line rescue medication.  I encouraged more liberal use for chest tightness, wheezing, cough, increased shortness of breath  Continue nebulized albuterol as second line rescue medication  We again discussed weight loss strategies emphasizing avoidance of all processed sugars and simple carbohydrates  We will obtain a download of her CPAP and make further recommendations as needed.  Follow-up in 4-6 months or sooner as needed    Merton Border, MD PCCM service Mobile [phone] Pager [phone] 07/31/2017 10:17 AM

## 2017-07-31 NOTE — Patient Instructions (Addendum)
Continue Advair, Singulair (montelukast), cetirizine (Zyrtec) as maintenance medications for asthma  Continue albuterol inhaler as needed for chest tightness, wheezing, cough, increased shortness of breath  We again discussed weight loss strategies  Follow-up in 4-6 months or sooner as needed

## 2017-08-09 ENCOUNTER — Ambulatory Visit: Payer: Medicare Other | Admitting: Psychiatry

## 2017-08-14 ENCOUNTER — Encounter: Payer: Self-pay | Admitting: Psychiatry

## 2017-08-14 ENCOUNTER — Other Ambulatory Visit: Payer: Self-pay

## 2017-08-14 ENCOUNTER — Ambulatory Visit: Payer: Medicare Other | Admitting: Psychiatry

## 2017-08-14 VITALS — BP 131/85 | HR 71 | Temp 98.0°F | Wt 299.8 lb

## 2017-08-14 DIAGNOSIS — F411 Generalized anxiety disorder: Secondary | ICD-10-CM | POA: Diagnosis not present

## 2017-08-14 DIAGNOSIS — F4310 Post-traumatic stress disorder, unspecified: Secondary | ICD-10-CM

## 2017-08-14 MED ORDER — BUSPIRONE HCL 15 MG PO TABS: 15.0000 mg | ORAL_TABLET | Freq: Four times a day (QID) | ORAL | 2 refills | Status: DC

## 2017-08-14 MED ORDER — ESCITALOPRAM OXALATE 10 MG PO TABS: ORAL_TABLET | ORAL | 2 refills | Status: DC

## 2017-08-14 MED ORDER — TRAZODONE HCL 100 MG PO TABS: 100.0000 mg | ORAL_TABLET | Freq: Every day | ORAL | 2 refills | Status: DC
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
## 2017-10-11 ENCOUNTER — Ambulatory Visit: Payer: Medicare Other

## 2017-10-12 ENCOUNTER — Ambulatory Visit: Payer: Medicare Other | Attending: Internal Medicine

## 2017-10-12 DIAGNOSIS — I10 Essential (primary) hypertension: Secondary | ICD-10-CM | POA: Insufficient documentation

## 2017-10-12 DIAGNOSIS — G4733 Obstructive sleep apnea (adult) (pediatric): Secondary | ICD-10-CM | POA: Diagnosis present

## 2017-11-12 ENCOUNTER — Other Ambulatory Visit: Payer: Self-pay | Admitting: Internal Medicine

## 2017-11-12 DIAGNOSIS — M5441 Lumbago with sciatica, right side: Secondary | ICD-10-CM

## 2017-11-29 ENCOUNTER — Other Ambulatory Visit
Admission: RE | Admit: 2017-11-29 | Discharge: 2017-11-29 | Disposition: A | Discharge status: Home / Self Care | Payer: Medicare Other | Source: Ambulatory Visit | Attending: Internal Medicine | Admitting: Internal Medicine

## 2017-11-29 ENCOUNTER — Encounter (INDEPENDENT_AMBULATORY_CARE_PROVIDER_SITE_OTHER): Payer: Self-pay

## 2017-11-29 ENCOUNTER — Ambulatory Visit
Admission: RE | Admit: 2017-11-29 | Discharge: 2017-11-29 | Disposition: A | Discharge status: Home / Self Care | Payer: Medicare Other | Source: Ambulatory Visit | Attending: Internal Medicine | Admitting: Internal Medicine

## 2017-11-29 DIAGNOSIS — M5126 Other intervertebral disc displacement, lumbar region: Secondary | ICD-10-CM | POA: Diagnosis not present

## 2017-11-29 DIAGNOSIS — M5136 Other intervertebral disc degeneration, lumbar region: Secondary | ICD-10-CM | POA: Diagnosis not present

## 2017-11-29 DIAGNOSIS — M5137 Other intervertebral disc degeneration, lumbosacral region: Secondary | ICD-10-CM | POA: Insufficient documentation

## 2017-11-29 DIAGNOSIS — M5441 Lumbago with sciatica, right side: Secondary | ICD-10-CM | POA: Insufficient documentation

## 2017-11-29 DIAGNOSIS — M48061 Spinal stenosis, lumbar region without neurogenic claudication: Secondary | ICD-10-CM | POA: Diagnosis not present

## 2017-11-29 LAB — CREATININE, SERUM
Creatinine, Ser: 1.18 mg/dL — ABNORMAL HIGH (ref 0.44–1.00)
GFR calc Af Amer: 58 mL/min — ABNORMAL LOW (ref >60)
GFR calc non Af Amer: 50 mL/min — ABNORMAL LOW (ref >60)

## 2017-11-29 MED ORDER — GADOBUTROL 1 MMOL/ML IV SOLN
10.0000 mL | Freq: Once | INTRAVENOUS | Status: AC | PRN
  Administered 2017-11-29: 10 mL via INTRAVENOUS

## 2017-12-04 ENCOUNTER — Ambulatory Visit: Payer: Medicare Other | Admitting: Psychiatry

## 2017-12-04 ENCOUNTER — Encounter: Payer: Self-pay | Admitting: Psychiatry

## 2017-12-04 VITALS — BP 136/85 | HR 78 | Temp 98.8°F | Wt 299.0 lb

## 2017-12-04 DIAGNOSIS — F4310 Post-traumatic stress disorder, unspecified: Secondary | ICD-10-CM

## 2017-12-04 DIAGNOSIS — F411 Generalized anxiety disorder: Secondary | ICD-10-CM

## 2017-12-04 DIAGNOSIS — F331 Major depressive disorder, recurrent, moderate: Secondary | ICD-10-CM | POA: Diagnosis not present

## 2017-12-04 MED ORDER — BUSPIRONE HCL 15 MG PO TABS: 15.0000 mg | ORAL_TABLET | Freq: Four times a day (QID) | ORAL | 2 refills | Status: DC

## 2017-12-04 MED ORDER — TRAZODONE HCL 100 MG PO TABS: 100.0000 mg | ORAL_TABLET | Freq: Every day | ORAL | 2 refills | Status: DC

## 2017-12-04 MED ORDER — ESCITALOPRAM OXALATE 10 MG PO TABS: ORAL_TABLET | ORAL | 2 refills | Status: DC
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
## 2018-01-10 ENCOUNTER — Other Ambulatory Visit: Payer: Self-pay | Admitting: Internal Medicine

## 2018-01-10 DIAGNOSIS — Z1231 Encounter for screening mammogram for malignant neoplasm of breast: Secondary | ICD-10-CM

## 2018-01-17 ENCOUNTER — Ambulatory Visit: Payer: Medicare Other | Admitting: Pulmonary Disease

## 2018-03-14 ENCOUNTER — Ambulatory Visit: Payer: Medicare Other | Admitting: Pulmonary Disease

## 2018-03-14 ENCOUNTER — Encounter: Payer: Self-pay | Admitting: Pulmonary Disease

## 2018-03-14 VITALS — BP 122/80 | HR 93 | Ht 65.0 in | Wt 296.0 lb

## 2018-03-14 DIAGNOSIS — J4540 Moderate persistent asthma, uncomplicated: Secondary | ICD-10-CM

## 2018-03-14 NOTE — Progress Notes (Signed)
PULMONARY OFFICE FOLLOW-UP NOTE  Requesting MD/Service: Hande Date of initial consultation: 03/14/17 Reason for consultation: Cough, dyspnea  PT PROFILE: 58 y.o. female smoker referred for several months of productive cough and dyspnea  DATA: 04/10/17 PFTs: no definite obstruction. Normal lung volumes. DLCO moderately low but corrects to normal for [facility]  INTERVAL: Last seen 07/31/17. No major pulmonary events in the interim  SUBJ:  This is a scheduled reevaluation.  She has no new complaints.  She continues to have mild-moderate exertional dyspnea.  She remains on and is compliant with Advair, singular, Zyrtec.  She rarely uses albuterol rescue inhaler, approximately 1 time per month.  She is wearing CPAP for obstructive sleep apnea.  Sometimes she does not use the CPAP due to nasal and sinus congestion.  When she does use it, she notices a significant improvement in sleep quality.  She denies CP, fever, purulent sputum, hemoptysis, LE edema and calf tenderness.  Vitals:   03/14/18 0957 03/14/18 1004  BP:  122/80  Pulse:  93  SpO2:  97%  Weight: 296 lb (134.3 kg)   Height: 5\' 5"  (1.651 m)   Room air  EXAM:  Gen: Obese, no overt distress HEENT: NCAT, sclerae white Neck: No JVD Lungs: breath sounds full, no wheezes or other adventitious sounds Cardiovascular: RRR, no murmurs Abdomen: Soft, nontender, normal BS Ext: without clubbing, cyanosis, edema Neuro: grossly intact Skin: Limited exam, no lesions noted    DATA:   BMP Latest Ref Rng & Units 11/29/2017 05/23/2017 01/10/2014  Glucose 65 - 99 mg/dL - 140(H) 103(H)  BUN 6 - 20 mg/dL - 16 7  Creatinine 0.44 - 1.00 mg/dL 1.18(H) 0.98 1.04  Sodium 135 - 145 mmol/L - 135 144  Potassium 3.5 - 5.1 mmol/L - 3.3(L) 3.6  Chloride 101 - 111 mmol/L - 109 115(H)  CO2 22 - 32 mmol/L - 18(L) 22  Calcium 8.9 - 10.3 mg/dL - 8.6(L) 7.9(L)    CBC Latest Ref Rng & Units 05/23/2017 01/10/2014 01/09/2014  WBC 3.6 - 11.0 K/uL 8.6 5.0 5.3   Hemoglobin 12.0 - 16.0 g/dL 15.5 12.8 12.4  Hematocrit 35.0 - 47.0 % 46.7 39.3 38.2  Platelets 150 - 440 K/uL 215 170 161    CXR: No new film  IMPRESSION:     ICD-10-CM   1. Moderate persistent asthma without complication H60.73   2. Severe obesity E66.01    The major contributor to her exertional dyspnea is obesity.  PLAN:  Continue Advair, Singulair (montelukast), cetirizine (Zyrtec) as maintenance medications for asthma  Continue albuterol inhaler needed for chest tightness, wheezing, cough, increased shortness of breath  We again discussed weight loss strategies emphasizing avoidance of all processed sugars and simple carbohydrates  Referral information for [facility] provided  Follow-up in 6 months.  Call sooner if needed    Merton Border, MD PCCM service Mobile [phone] Pager [phone] 03/17/2018 3:55 PM

## 2018-03-14 NOTE — Patient Instructions (Signed)
Continue Advair, Singulair (montelukast), cetirizine (Zyrtec) as maintenance medications for asthma  Continue albuterol inhaler needed for chest tightness, wheezing, cough, increased shortness of breath  We again discussed weight loss strategies emphasizing avoidance of all processed sugars and simple carbohydrates  Referral information for [place] provided  Follow-up in 6 months.  Call sooner if needed

## 2018-04-02 ENCOUNTER — Other Ambulatory Visit: Payer: Self-pay

## 2018-04-02 ENCOUNTER — Ambulatory Visit: Payer: Medicare Other | Admitting: Psychiatry

## 2018-04-02 ENCOUNTER — Encounter: Payer: Self-pay | Admitting: Psychiatry

## 2018-04-02 VITALS — BP 131/82 | HR 77 | Temp 99.5°F | Wt 297.2 lb

## 2018-04-02 DIAGNOSIS — F4310 Post-traumatic stress disorder, unspecified: Secondary | ICD-10-CM

## 2018-04-02 DIAGNOSIS — F411 Generalized anxiety disorder: Secondary | ICD-10-CM

## 2018-04-02 DIAGNOSIS — F331 Major depressive disorder, recurrent, moderate: Secondary | ICD-10-CM

## 2018-04-02 MED ORDER — BUSPIRONE HCL 15 MG PO TABS: 15.0000 mg | ORAL_TABLET | Freq: Four times a day (QID) | ORAL | 2 refills | Status: DC

## 2018-04-02 MED ORDER — ESCITALOPRAM OXALATE 10 MG PO TABS: ORAL_TABLET | ORAL | 2 refills | Status: DC

## 2018-04-02 MED ORDER — TRAZODONE HCL 100 MG PO TABS: 100.0000 mg | ORAL_TABLET | Freq: Every day | ORAL | 2 refills | Status: DC

## 2018-04-02 NOTE — Progress Notes (Signed)
Patient ID: [name], female   DOB: 1960-11-07, 58 y.o.   MRN: [id number]   [facility] MD/PA/NP OP Progress Note  04/02/2018 9:25 AM [name]  MRN:  [id number]  Subjective:  Patient returns for follow-up of her major depressive disorder and anxiety.  Patient reports that she has been doing okay overall but has been in a slump more recently.  Continues to see Otila Kluver in therapy. Denies any suicidal thoughts. Compliant with her medications. She denies any active suicidal thoughts.  Chief Complaint: doing okay Chief Complaint    Follow-up; Medication Refill     Visit Diagnosis:     ICD-10-CM   1. GAD (generalized anxiety disorder) F41.1   2. Post traumatic stress disorder (PTSD) F43.10   3. MDD (major depressive disorder), recurrent episode, moderate ([place]) F33.1     Past Medical History:  Past Medical History:  Diagnosis Date  . Anxiety   . Arthritis   . Asthma   . Depression   . Diverticulitis   . Heart palpitations   . Liver disease   . Migraines   . Sleep apnea     Past Surgical History:  Procedure Laterality Date  . ABDOMINAL HYSTERECTOMY    . APPENDECTOMY    . CHOLECYSTECTOMY    . COLONOSCOPY WITH PROPOFOL N/A 04/21/2015   Procedure: COLONOSCOPY WITH PROPOFOL;  Surgeon: Manya Silvas, MD;  Location: [facility] ENDOSCOPY;  Service: Endoscopy;  Laterality: N/A;  . SINUSOTOMY    . TONSILLECTOMY     Family History:  Family History  Problem Relation Age of Onset  . Diabetes Mother   . Muscular dystrophy Mother   . Heart failure Mother   . Anxiety disorder Mother   . Skin cancer Father   . Heart disease Father   . Hypertension Father   . Depression Father   . Alcohol abuse Father   . Drug abuse Father   . Heart disease Sister   . Diabetes Sister   . Anxiety disorder Sister   . Hypertension Brother   . Diverticulosis Brother   . Heart disease Sister   . Diabetes Sister   . Heart disease Sister   . Diabetes Sister    Social History:  Social History    Socioeconomic History  . Marital status: Married    Spouse name: Journalist, newspaper  . Number of children: 0  . Years of education: Not on file  . Highest education level: High school graduate  Occupational History    Comment: disabled  Social Needs  . Financial resource strain: Not hard at all  . Food insecurity:    Worry: Never true    Inability: Never true  . Transportation needs:    Medical: No    Non-medical: No  Tobacco Use  . Smoking status: Former Smoker    Types: Cigarettes    Last attempt to quit: 01/15/2017    Years since quitting: 1.2  . Smokeless tobacco: Never Used  Substance and Sexual Activity  . Alcohol use: No    Alcohol/week: 0.0 standard drinks  . Drug use: No  . Sexual activity: Yes    Birth control/protection: None  Lifestyle  . Physical activity:    Days per week: 0 days    Minutes per session: 0 min  . Stress: Rather much  Relationships  . Social connections:    Talks on phone: More than three times a week    Gets together: Once a week    Attends religious service: Never  Active member of club or organization: No    Attends meetings of clubs or organizations: Never    Relationship status: Married  Other Topics Concern  . Not on file  Social History Narrative  . Not on file   Additional History:   Assessment:   Musculoskeletal: Strength & Muscle Tone: within normal limits Gait & Station: normal Patient leans: N/A  Psychiatric Specialty Exam: Medication Refill   Anxiety  Patient reports no insomnia, nervous/anxious behavior or suicidal ideas.    Depression         Associated symptoms include does not have insomnia and no suicidal ideas.  Past medical history includes anxiety.     Review of Systems  Psychiatric/Behavioral: Negative for depression, hallucinations, memory loss, substance abuse and suicidal ideas. The patient is not nervous/anxious and does not have insomnia.   All other systems reviewed and are negative.   Blood  pressure 131/82, pulse 77, temperature 99.5 F (37.5 C), temperature source Oral, weight 297 lb 3.2 oz (134.8 kg).Body mass index is 49.46 kg/m.  General Appearance: Neat and Well Groomed  Eye Contact:  Good  Speech:  Normal Rate  Volume:  Normal  Mood:  ok  Affect: Pleasant   Thought Process:  Linear and Logical  Orientation:  Full (Time, Place, and Person)  Thought Content:  Negative  Suicidal Thoughts: denies  Homicidal Thoughts:  No  Memory:  Immediate;   Good Recent;   Good Remote;   Good  Judgement:  Good  Insight:  Good  Psychomotor Activity:  Negative  Concentration:  Good  Recall:  Good  Fund of Knowledge: Negative  Language: Good  Akathisia:  Negative  Handed:  Right  AIMS (if indicated):  na  Assets:  Communication Skills Desire for Improvement Social Support  ADL's:  Intact  Cognition: WNL  Sleep:  ok   Is the patient at risk to self?  No. Has the patient been a risk to self in the past 6 months?  No. Has the patient been a risk to self within the distant past?  No. Is the patient a risk to others?  No. Has the patient been a risk to others in the past 6 months?  No. Has the patient been a risk to others within the distant past?  No.  Current Medications: Current Outpatient Medications  Medication Sig Dispense Refill  . aspirin EC 81 MG tablet Take 81 mg by mouth daily.    . busPIRone (BUSPAR) 15 MG tablet Take 1 tablet (15 mg total) by mouth QID. 120 tablet 2  . cetirizine (ZYRTEC ALLERGY) 10 MG tablet Take 20 mg by mouth daily.    Marland Kitchen escitalopram (LEXAPRO) 10 MG tablet Take 1 and half tablet by mouth daily 45 tablet 2  . fluticasone-salmeterol (ADVAIR HFA) 230-21 MCG/ACT inhaler Inhale 2 puffs into the lungs 2 (two) times daily. 1 Inhaler 12  . metoprolol succinate (TOPROL-XL) 25 MG 24 hr tablet Take 12.5 mg by mouth daily.   3  . montelukast (SINGULAIR) 10 MG tablet Take 10 mg by mouth at bedtime.    Marland Kitchen oxyCODONE (OXY IR/ROXICODONE) 5 MG immediate  release tablet Take by mouth.    . topiramate (TOPAMAX) 200 MG tablet Take 200 mg by mouth daily.     . traZODone (DESYREL) 100 MG tablet Take 1 tablet (100 mg total) by mouth at bedtime. 30 tablet 2  . Vitamin D, Ergocalciferol, (DRISDOL) 50000 units CAPS capsule TAKE 1 CAPSULE BY MOUTH 1 TIME A  WEEK    . nitroGLYCERIN (NITROSTAT) 0.4 MG SL tablet Place 0.4 mg under the tongue every 5 (five) minutes as needed.     . rosuvastatin (CRESTOR) 5 MG tablet Take 5 mg by mouth daily.      No current facility-administered medications for this visit.     Medical Decision Making:  Established Problem, Stable/Improving (1), Review of Medication Regimen & Side Effects (2) and Review of New Medication or Change in Dosage (2)  Treatment Plan Summary:Medication management and Plan   Major depressive disorder, recurrent, moderate- Continue  Lexapro at 15mg  po qd. Continue therapy with Miguel Dibble.  PTSD-Lexapro 15mg  po qd   Generalized anxiety disorder- Continue BuSpar at 15 mg 4 times daily.  Insomnia-  Continue trazodone at 100 mg at bedtime.    She is to return  in 3 months for a follow-up appointment and to call before if needed. Patient aware of safety plan if she has suicidal thoughts and to call 911 or drive to the nearest emergency room. Patient understands this.   Aireanna Luellen 04/02/2018, 9:25 AM

## 2018-04-11 ENCOUNTER — Telehealth: Payer: Self-pay | Admitting: Pulmonary Disease

## 2018-04-11 NOTE — Telephone Encounter (Signed)
Rx was Advair 230 was refilled today, as requested by [facility]. I have made pt aware of this information. Nothing further is needed at this time.

## 2018-04-11 NOTE — Telephone Encounter (Signed)
Pt called in reference to refill request

## 2018-07-02 ENCOUNTER — Encounter: Payer: Self-pay | Admitting: Psychiatry

## 2018-07-02 ENCOUNTER — Other Ambulatory Visit: Payer: Self-pay

## 2018-07-02 ENCOUNTER — Ambulatory Visit (INDEPENDENT_AMBULATORY_CARE_PROVIDER_SITE_OTHER): Payer: Medicare Other | Admitting: Psychiatry

## 2018-07-02 DIAGNOSIS — F411 Generalized anxiety disorder: Secondary | ICD-10-CM | POA: Diagnosis not present

## 2018-07-02 DIAGNOSIS — F331 Major depressive disorder, recurrent, moderate: Secondary | ICD-10-CM

## 2018-07-02 DIAGNOSIS — F4310 Post-traumatic stress disorder, unspecified: Secondary | ICD-10-CM

## 2018-07-02 MED ORDER — QUETIAPINE FUMARATE 50 MG PO TABS: 50.0000 mg | ORAL_TABLET | Freq: Every day | ORAL | 0 refills | Status: DC

## 2018-07-02 MED ORDER — ESCITALOPRAM OXALATE 20 MG PO TABS: 20.0000 mg | ORAL_TABLET | Freq: Every day | ORAL | 0 refills | Status: DC

## 2018-07-02 NOTE — Progress Notes (Signed)
Virtual Visit via Telephone Note  I connected with [name] on 07/02/18 at  8:30 AM EDT by telephone and verified that I am speaking with the correct person using two identifiers.   I discussed the limitations, risks, security and privacy concerns of performing an evaluation and management service by telephone and the availability of in person appointments. I also discussed with the patient that there may be a patient responsible charge related to this service. The patient expressed understanding and agreed to proceed.    I discussed the assessment and treatment plan with the patient. The patient was provided an opportunity to ask questions and all were answered. The patient agreed with the plan and demonstrated an understanding of the instructions.   The patient was advised to call back or seek an in-person evaluation if the symptoms worsen or if the condition fails to improve as anticipated.   [place] MD OP Progress Note  07/02/2018 5:51 PM [name]  MRN:  [id number]  Chief Complaint:  Chief Complaint    Follow-up     HPI: [name] is a 58 year old female, married, lives in [place], has a history of anxiety disorder, MDD, PTSD was evaluated by phone today.  Patient could not be contacted by video due to network problems.  Patient reports she is currently struggling with worsening mood symptoms.  She reports she has racing thoughts and flashbacks.  This has been getting worse since the past few weeks.  She also reports that she is not sleeping as she used to before.  Patient reports she continues to work with her therapist and that has been beneficial.  Patient reports she tried taking trazodone at bedtime however that does not seem to help at all.  She continues to be compliant with Lexapro and BuSpar.  She however wonders if these medications are really helping her or not.  Discussed readjusting the dosage of Lexapro.  Discussed adding Seroquel.  Discussed stopping trazodone  since she is not sleeping on the same.  Patient denies any suicidality, homicidality or perceptual disturbances. Visit Diagnosis:    ICD-10-CM   1. GAD (generalized anxiety disorder)  F41.1 QUEtiapine (SEROQUEL) 50 MG tablet    escitalopram (LEXAPRO) 20 MG tablet  2. Post traumatic stress disorder (PTSD)  F43.10 QUEtiapine (SEROQUEL) 50 MG tablet    escitalopram (LEXAPRO) 20 MG tablet  3. MDD (major depressive disorder), recurrent episode, moderate ([facility])  F33.1 QUEtiapine (SEROQUEL) 50 MG tablet    escitalopram (LEXAPRO) 20 MG tablet    Past Psychiatric History:Pt history of GAD, PTSD, MDD, was under the care of Dr. Einar Grad previously.  Last appointment with Dr. Einar Grad was on 04/02/2018  Past Medical History:  Past Medical History:  Diagnosis Date  . Anxiety   . Arthritis   . Asthma   . Depression   . Diverticulitis   . Heart palpitations   . Liver disease   . Migraines   . Sleep apnea     Past Surgical History:  Procedure Laterality Date  . ABDOMINAL HYSTERECTOMY    . APPENDECTOMY    . CHOLECYSTECTOMY    . COLONOSCOPY WITH PROPOFOL N/A 04/21/2015   Procedure: COLONOSCOPY WITH PROPOFOL;  Surgeon: Manya Silvas, MD;  Location: [facility] ENDOSCOPY;  Service: Endoscopy;  Laterality: N/A;  . SINUSOTOMY    . TONSILLECTOMY      Family Psychiatric History: As noted below. Family History:  Family History  Problem Relation Age of Onset  . Diabetes Mother   . Muscular  dystrophy Mother   . Heart failure Mother   . Anxiety disorder Mother   . Skin cancer Father   . Heart disease Father   . Hypertension Father   . Depression Father   . Alcohol abuse Father   . Drug abuse Father   . Heart disease Sister   . Diabetes Sister   . Anxiety disorder Sister   . Hypertension Brother   . Diverticulosis Brother   . Heart disease Sister   . Diabetes Sister   . Heart disease Sister   . Diabetes Sister     Social History:Pt is married.  She lives with her husband in [place]. Social History    Socioeconomic History  . Marital status: Married    Spouse name: Journalist, newspaper  . Number of children: 0  . Years of education: Not on file  . Highest education level: High school graduate  Occupational History    Comment: disabled  Social Needs  . Financial resource strain: Not hard at all  . Food insecurity    Worry: Never true    Inability: Never true  . Transportation needs    Medical: No    Non-medical: No  Tobacco Use  . Smoking status: Former Smoker    Types: Cigarettes    Quit date: 01/15/2017    Years since quitting: 1.4  . Smokeless tobacco: Never Used  Substance and Sexual Activity  . Alcohol use: No    Alcohol/week: 0.0 standard drinks  . Drug use: No  . Sexual activity: Yes    Birth control/protection: None  Lifestyle  . Physical activity    Days per week: 0 days    Minutes per session: 0 min  . Stress: Rather much  Relationships  . Social connections    Talks on phone: More than three times a week    Gets together: Once a week    Attends religious service: Never    Active member of club or organization: No    Attends meetings of clubs or organizations: Never    Relationship status: Married  Other Topics Concern  . Not on file  Social History Narrative  . Not on file    Allergies:  Allergies  Allergen Reactions  . Cefotetan Rash  . Iodinated Diagnostic Agents Other (See Comments) and Hives    Uncoded Allergy. Allergen: MYCIN FAMILY Uncoded Allergy. Allergen: MULTIPLE FOODS, Other Reaction: Other reaction Uncoded Allergy. Allergen: MYCIN FAMILY Uncoded Allergy. Allergen: MULTIPLE FOODS, Other Reaction: Other reaction Uncoded Allergy. Allergen: MYCIN FAMILY Uncoded Allergy. Allergen: MULTIPLE FOODS, Other Reaction: Other reaction  . Other Anaphylaxis    Allergic to seafood  . Promethazine Hives  . Promethazine Hcl Hives  . Propoxyphene Other (See Comments)  . Propoxyphene Other (See Comments)  . Sulfa Antibiotics Hives  . Penicillins Hives  .  Saccharin Hives  . Aminoglycosides Hives  . Cefuroxime   . Eggs Or Egg-Derived Products   . Influenza Vaccines Swelling    Redness and swelling of site. Pain and fever.   . Latex   . Red Dye     Metabolic Disorder Labs: No results found for: HGBA1C, MPG No results found for: PROLACTIN No results found for: CHOL, TRIG, HDL, CHOLHDL, VLDL, LDLCALC No results found for: TSH  Therapeutic Level Labs: No results found for: LITHIUM No results found for: VALPROATE No components found for:  CBMZ  Current Medications: Current Outpatient Medications  Medication Sig Dispense Refill  . albuterol (VENTOLIN HFA) 108 (90 Base) MCG/ACT inhaler  INHALE 2 PUFFS INTO THE LUNGS EVERY 6 HOURS AS NEEDED FOR WHEEZING    . ADVAIR HFA 230-21 MCG/ACT inhaler INHALE 2 PUFFS INTO THE LUNGS TWICE DAILY 12 g 5  . ADVAIR HFA 230-21 MCG/ACT inhaler     . aspirin EC 81 MG tablet Take 81 mg by mouth daily.    . busPIRone (BUSPAR) 15 MG tablet Take 1 tablet (15 mg total) by mouth QID. 120 tablet 2  . cetirizine (ZYRTEC ALLERGY) 10 MG tablet Take 20 mg by mouth daily.    Marland Kitchen escitalopram (LEXAPRO) 20 MG tablet Take 1 tablet (20 mg total) by mouth daily. 90 tablet 0  . metoprolol succinate (TOPROL-XL) 25 MG 24 hr tablet Take 12.5 mg by mouth daily.   3  . metoprolol succinate (TOPROL-XL) 25 MG 24 hr tablet     . montelukast (SINGULAIR) 10 MG tablet Take 10 mg by mouth at bedtime.    . nitroGLYCERIN (NITROSTAT) 0.4 MG SL tablet Place 0.4 mg under the tongue every 5 (five) minutes as needed.     Marland Kitchen oxyCODONE (OXY IR/ROXICODONE) 5 MG immediate release tablet Take by mouth.    . QUEtiapine (SEROQUEL) 50 MG tablet Take 1 tablet (50 mg total) by mouth at bedtime. Take half tablet for 2 weeks and increase to 1 tablet after that. 90 tablet 0  . rosuvastatin (CRESTOR) 5 MG tablet Take 5 mg by mouth daily.     Marland Kitchen SINGULAIR 10 MG tablet     . topiramate (TOPAMAX) 200 MG tablet Take 200 mg by mouth daily.     . Vitamin D,  Ergocalciferol, (DRISDOL) 50000 units CAPS capsule TAKE 1 CAPSULE BY MOUTH 1 TIME A WEEK     No current facility-administered medications for this visit.      Musculoskeletal: Strength & Muscle Tone: reports as WNL Gait & Station: Reports as WNL Patient leans: N/A  Psychiatric Specialty Exam: Review of Systems  Psychiatric/Behavioral: Positive for depression. The patient is nervous/anxious and has insomnia.   All other systems reviewed and are negative.   There were no vitals taken for this visit.There is no height or weight on file to calculate BMI.  General Appearance: UTA  Eye Contact:  UTA  Speech:  Clear and Coherent  Volume:  Normal  Mood:  Anxious and Depressed  Affect:  UTA  Thought Process:  Goal Directed and Descriptions of Associations: Intact  Orientation:  Full (Time, Place, and Person)  Thought Content: Logical   Suicidal Thoughts:  No  Homicidal Thoughts:  No  Memory:  Immediate;   Fair Recent;   Fair Remote;   Fair  Judgement:  Fair  Insight:  Fair  Psychomotor Activity:  UTA  Concentration:  Concentration: Fair and Attention Span: Fair  Recall:  [employer] of Knowledge: Fair  Language: Fair  Akathisia:  No  Handed:  Left  AIMS (if indicated): Denies tremors, rigidity,stiffness  Assets:  Communication Skills Housing Social Support  ADL's:  Intact  Cognition: WNL  Sleep:  Poor   Screenings:   Assessment and Plan: [name] is a 58 year old Caucasian female, married, lives in [place], has a history of MDD, PTSD, GAD was evaluated by phone today.  Patient is biologically predisposed given her history of trauma as well as history of mental health problems in her family.  Patient currently struggles with depression and anxiety symptoms as well as sleep problems.  She will benefit from medication readjustment.  Plan GAD-unstable Increase Lexapro to 20 mg p.o.  daily Continue therapy with Ms. Miguel Dibble.  For PTSD- unstable Add Seroquel 25 mg p.o.  nightly for few days and increase to 50 mg.  For MDD- unstable Increase Lexapro to 20 mg p.o. daily Continue BuSpar as prescribed Discontinue trazodone. Add Seroquel as prescribed  Have reviewed medical records in [facility] per Dr. Nolon Nations 04/02/2018-patient was advised to continue Lexapro 15 mg and BuSpar 15 mg from 4 times a day at that visit.'  Patient to continue CBT with Ms. Miguel Dibble.  Follow-up in clinic in 3 to 4 weeks or sooner if needed.  August 3 at 2:30 PM  I have spent atleast 15 minutes non face to face with patient today. More than 50 % of the time was spent for psychoeducation and supportive psychotherapy and care coordination.  This note was generated in part or whole with voice recognition software. Voice recognition is usually quite accurate but there are transcription errors that can and very often do occur. I apologize for any typographical errors that were not detected and corrected.       Ursula Alert, MD 07/02/2018, 5:51 PM

## 2018-08-07 ENCOUNTER — Other Ambulatory Visit: Payer: Self-pay | Admitting: Psychiatry

## 2018-08-07 DIAGNOSIS — F331 Major depressive disorder, recurrent, moderate: Secondary | ICD-10-CM

## 2018-08-07 DIAGNOSIS — F411 Generalized anxiety disorder: Secondary | ICD-10-CM

## 2018-08-07 DIAGNOSIS — F4310 Post-traumatic stress disorder, unspecified: Secondary | ICD-10-CM

## 2018-08-19 ENCOUNTER — Encounter: Payer: Self-pay | Admitting: Psychiatry

## 2018-08-19 ENCOUNTER — Ambulatory Visit (INDEPENDENT_AMBULATORY_CARE_PROVIDER_SITE_OTHER): Payer: Medicare Other | Admitting: Psychiatry

## 2018-08-19 ENCOUNTER — Other Ambulatory Visit: Payer: Self-pay

## 2018-08-19 DIAGNOSIS — F331 Major depressive disorder, recurrent, moderate: Secondary | ICD-10-CM

## 2018-08-19 DIAGNOSIS — F4310 Post-traumatic stress disorder, unspecified: Secondary | ICD-10-CM | POA: Diagnosis not present

## 2018-08-19 DIAGNOSIS — F411 Generalized anxiety disorder: Secondary | ICD-10-CM | POA: Diagnosis not present

## 2018-08-19 MED ORDER — QUETIAPINE FUMARATE 25 MG PO TABS: 25.0000 mg | ORAL_TABLET | Freq: Every day | ORAL | 0 refills | Status: DC

## 2018-08-19 MED ORDER — BUSPIRONE HCL 15 MG PO TABS: 15.0000 mg | ORAL_TABLET | Freq: Two times a day (BID) | ORAL | 1 refills | Status: DC

## 2018-08-19 NOTE — Progress Notes (Signed)
Virtual Visit via Video Note  I connected with [name] on 08/19/18 at  2:30 PM EDT by a video enabled telemedicine application and verified that I am speaking with the correct person using two identifiers.   I discussed the limitations of evaluation and management by telemedicine and the availability of in person appointments. The patient expressed understanding and agreed to proceed.    I discussed the assessment and treatment plan with the patient. The patient was provided an opportunity to ask questions and all were answered. The patient agreed with the plan and demonstrated an understanding of the instructions.   The patient was advised to call back or seek an in-person evaluation if the symptoms worsen or if the condition fails to improve as anticipated.   [place] MD OP Progress Note  08/19/2018 5:26 PM [name]  MRN:  [id number]  Chief Complaint:  Chief Complaint    Follow-up     HPI: [name] is a 58 year old female who has a history of MDD, GAD, PTSD was evaluated by video consult today.  Patient is married and lives in [place].  Patient today reports her mood symptoms have improved since the medication changes.  She however continues to struggle with some fatigue and lethargy during the day.  She reports she had the lethargy and fatigue even previously however she wonders if her medications are also contributing to it.  She believes it started getting worse after starting the Seroquel.  She reports sleep is good on the current medication.  She continues to struggle with PTSD symptoms however reports she continues to work with her therapist Ms. Miguel Dibble which is very beneficial.  Patient denies any suicidality, homicidality or perceptual disturbances. Visit Diagnosis:    ICD-10-CM   1. GAD (generalized anxiety disorder)  F41.1 QUEtiapine (SEROQUEL) 25 MG tablet  2. Post traumatic stress disorder (PTSD)  F43.10 QUEtiapine (SEROQUEL) 25 MG tablet  3. MDD (major  depressive disorder), recurrent episode, moderate (HCC)  F33.1 QUEtiapine (SEROQUEL) 25 MG tablet    Past Psychiatric History: I have reviewed past psychiatric history from my progress note on 07/02/2018.  Past Medical History:  Past Medical History:  Diagnosis Date  . Anxiety   . Arthritis   . Asthma   . Depression   . Diverticulitis   . Heart palpitations   . Liver disease   . Migraines   . Sleep apnea     Past Surgical History:  Procedure Laterality Date  . ABDOMINAL HYSTERECTOMY    . APPENDECTOMY    . CHOLECYSTECTOMY    . COLONOSCOPY WITH PROPOFOL N/A 04/21/2015   Procedure: COLONOSCOPY WITH PROPOFOL;  Surgeon: Manya Silvas, MD;  Location: [facility] ENDOSCOPY;  Service: Endoscopy;  Laterality: N/A;  . SINUSOTOMY    . TONSILLECTOMY      Family Psychiatric History: I have reviewed family psychiatric history from my progress note on 07/02/2018.  Family History:  Family History  Problem Relation Age of Onset  . Diabetes Mother   . Muscular dystrophy Mother   . Heart failure Mother   . Anxiety disorder Mother   . Skin cancer Father   . Heart disease Father   . Hypertension Father   . Depression Father   . Alcohol abuse Father   . Drug abuse Father   . Heart disease Sister   . Diabetes Sister   . Anxiety disorder Sister   . Hypertension Brother   . Diverticulosis Brother   . Heart disease Sister   .  Diabetes Sister   . Heart disease Sister   . Diabetes Sister     Social History: I have reviewed social history from my progress note on 07/02/2018. Social History   Socioeconomic History  . Marital status: Married    Spouse name: Journalist, newspaper  . Number of children: 0  . Years of education: Not on file  . Highest education level: High school graduate  Occupational History    Comment: disabled  Social Needs  . Financial resource strain: Not hard at all  . Food insecurity    Worry: Never true    Inability: Never true  . Transportation needs    Medical: No     Non-medical: No  Tobacco Use  . Smoking status: Former Smoker    Types: Cigarettes    Quit date: 01/15/2017    Years since quitting: 1.5  . Smokeless tobacco: Never Used  Substance and Sexual Activity  . Alcohol use: No    Alcohol/week: 0.0 standard drinks  . Drug use: No  . Sexual activity: Yes    Birth control/protection: None  Lifestyle  . Physical activity    Days per week: 0 days    Minutes per session: 0 min  . Stress: Rather much  Relationships  . Social connections    Talks on phone: More than three times a week    Gets together: Once a week    Attends religious service: Never    Active member of club or organization: No    Attends meetings of clubs or organizations: Never    Relationship status: Married  Other Topics Concern  . Not on file  Social History Narrative  . Not on file    Allergies:  Allergies  Allergen Reactions  . Cefotetan Rash  . Iodinated Diagnostic Agents Other (See Comments) and Hives    Uncoded Allergy. Allergen: MYCIN FAMILY Uncoded Allergy. Allergen: MULTIPLE FOODS, Other Reaction: Other reaction Uncoded Allergy. Allergen: MYCIN FAMILY Uncoded Allergy. Allergen: MULTIPLE FOODS, Other Reaction: Other reaction Uncoded Allergy. Allergen: MYCIN FAMILY Uncoded Allergy. Allergen: MULTIPLE FOODS, Other Reaction: Other reaction  . Other Anaphylaxis    Allergic to seafood  . Promethazine Hives  . Promethazine Hcl Hives  . Propoxyphene Other (See Comments)  . Propoxyphene Other (See Comments)  . Sulfa Antibiotics Hives  . Penicillins Hives  . Saccharin Hives  . Aminoglycosides Hives  . Cefuroxime   . Eggs Or Egg-Derived Products   . Influenza Vaccines Swelling    Redness and swelling of site. Pain and fever.   . Latex   . Red Dye     Metabolic Disorder Labs: No results found for: HGBA1C, MPG No results found for: PROLACTIN No results found for: CHOL, TRIG, HDL, CHOLHDL, VLDL, LDLCALC No results found for: TSH  Therapeutic Level  Labs: No results found for: LITHIUM No results found for: VALPROATE No components found for:  CBMZ  Current Medications: Current Outpatient Medications  Medication Sig Dispense Refill  . ADVAIR HFA 230-21 MCG/ACT inhaler INHALE 2 PUFFS INTO THE LUNGS TWICE DAILY 12 g 5  . ADVAIR HFA 230-21 MCG/ACT inhaler     . albuterol (VENTOLIN HFA) 108 (90 Base) MCG/ACT inhaler INHALE 2 PUFFS INTO THE LUNGS EVERY 6 HOURS AS NEEDED FOR WHEEZING    . aspirin EC 81 MG tablet Take 81 mg by mouth daily.    . busPIRone (BUSPAR) 15 MG tablet Take 1 tablet (15 mg total) by mouth 2 (two) times daily. 60 tablet 1  . cetirizine (  ZYRTEC ALLERGY) 10 MG tablet Take 20 mg by mouth daily.    Marland Kitchen doxycycline (VIBRAMYCIN) 100 MG capsule     . escitalopram (LEXAPRO) 20 MG tablet TAKE 1 TABLET(20 MG) BY MOUTH DAILY 90 tablet 0  . metoprolol succinate (TOPROL-XL) 25 MG 24 hr tablet Take 12.5 mg by mouth daily.   3  . metoprolol succinate (TOPROL-XL) 25 MG 24 hr tablet     . montelukast (SINGULAIR) 10 MG tablet Take 10 mg by mouth at bedtime.    . nitroGLYCERIN (NITROSTAT) 0.4 MG SL tablet Place 0.4 mg under the tongue every 5 (five) minutes as needed.     Marland Kitchen oxyCODONE (OXY IR/ROXICODONE) 5 MG immediate release tablet Take by mouth.    . QUEtiapine (SEROQUEL) 25 MG tablet Take 1 tablet (25 mg total) by mouth at bedtime. 90 tablet 0  . rosuvastatin (CRESTOR) 5 MG tablet Take 5 mg by mouth daily.     Marland Kitchen SINGULAIR 10 MG tablet     . topiramate (TOPAMAX) 200 MG tablet Take 200 mg by mouth daily.     . Vitamin D, Ergocalciferol, (DRISDOL) 50000 units CAPS capsule TAKE 1 CAPSULE BY MOUTH 1 TIME A WEEK     No current facility-administered medications for this visit.      Musculoskeletal: Strength & Muscle Tone: UTA Gait & Station: Reports as WNL Patient leans: N/A  Psychiatric Specialty Exam: Review of Systems  Constitutional: Positive for malaise/fatigue.  Psychiatric/Behavioral: Positive for depression.  All other  systems reviewed and are negative.   There were no vitals taken for this visit.There is no height or weight on file to calculate BMI.  General Appearance: Casual  Eye Contact:  Fair  Speech:  Clear and Coherent  Volume:  Normal  Mood:  Depressed  Affect:  Congruent  Thought Process:  Goal Directed and Descriptions of Associations: Intact  Orientation:  Full (Time, Place, and Person)  Thought Content: Logical   Suicidal Thoughts:  No  Homicidal Thoughts:  No  Memory:  Immediate;   Fair Recent;   Fair Remote;   Fair  Judgement:  Fair  Insight:  Fair  Psychomotor Activity:  Normal  Concentration:  Concentration: Fair and Attention Span: Fair  Recall:  [employer] of Knowledge: Fair  Language: Fair  Akathisia:  No  Handed:  Right  AIMS (if indicated): Denies tremors, rigidity,stiffness  Assets:  Communication Skills Desire for Improvement Social Support  ADL's:  Intact  Cognition: WNL  Sleep:  Fair   Screenings:   Assessment and Plan: [name] is a 58 year old Caucasian female, married, lives in [place], has a history of PTSD, MDD, GAD was evaluated by telemedicine today.  Patient is biologically predisposed given her history of trauma.  Patient reports some improvement in her mood however continues to struggle with possible side effects of Seroquel.  She will continue to benefit from medication readjustment as well as psychotherapy sessions.  Plan For GAD-improving Lexapro 20 mg p.o. daily Continue psychotherapy with Ms. Miguel Dibble.  For PTSD-improving Continue Seroquel. Continue CBT  For MDD-improving Lexapro 20 mg p.o. daily Reduce Seroquel to 25 mg p.o. nightly since she has adverse side effects.  Follow-up in clinic in 4 weeks or sooner if needed.  September 10 at 2:30 PM  I have spent atleast 15 minutes non face to face with patient today. More than 50 % of the time was spent for psychoeducation and supportive psychotherapy and care coordination.  This note was  generated in part  or whole with voice recognition software. Voice recognition is usually quite accurate but there are transcription errors that can and very often do occur. I apologize for any typographical errors that were not detected and corrected.        Ursula Alert, MD 08/19/2018, 5:26 PM

## 2018-08-23 ENCOUNTER — Telehealth: Payer: Self-pay | Admitting: Psychiatry

## 2018-08-23 DIAGNOSIS — F411 Generalized anxiety disorder: Secondary | ICD-10-CM

## 2018-08-23 DIAGNOSIS — F331 Major depressive disorder, recurrent, moderate: Secondary | ICD-10-CM

## 2018-08-23 DIAGNOSIS — F4310 Post-traumatic stress disorder, unspecified: Secondary | ICD-10-CM

## 2018-08-23 MED ORDER — QUETIAPINE FUMARATE 50 MG PO TABS: 25.0000 mg | ORAL_TABLET | Freq: Every day | ORAL | 1 refills | Status: DC

## 2018-08-23 NOTE — Telephone Encounter (Signed)
Sent Seroquel to pharmacy. 

## 2018-08-26 NOTE — Telephone Encounter (Signed)
seroquel 50 mg was sent to pharmacy

## 2018-08-29 ENCOUNTER — Ambulatory Visit: Payer: Medicare Other | Admitting: Psychiatry

## 2018-09-26 ENCOUNTER — Other Ambulatory Visit: Payer: Self-pay

## 2018-09-26 ENCOUNTER — Ambulatory Visit (INDEPENDENT_AMBULATORY_CARE_PROVIDER_SITE_OTHER): Payer: Medicare Other | Admitting: Psychiatry

## 2018-09-26 ENCOUNTER — Encounter: Payer: Self-pay | Admitting: Psychiatry

## 2018-09-26 DIAGNOSIS — F411 Generalized anxiety disorder: Secondary | ICD-10-CM | POA: Diagnosis not present

## 2018-09-26 DIAGNOSIS — F4310 Post-traumatic stress disorder, unspecified: Secondary | ICD-10-CM | POA: Diagnosis not present

## 2018-09-26 DIAGNOSIS — F331 Major depressive disorder, recurrent, moderate: Secondary | ICD-10-CM

## 2018-09-26 MED ORDER — HYDROXYZINE HCL 25 MG PO TABS: 12.5000 mg | ORAL_TABLET | Freq: Two times a day (BID) | ORAL | 1 refills | Status: DC | PRN

## 2018-09-26 NOTE — Progress Notes (Signed)
Virtual Visit via Video Note  I connected with [name] on 09/26/18 at  2:30 PM EDT by a video enabled telemedicine application and verified that I am speaking with the correct person using two identifiers.   I discussed the limitations of evaluation and management by telemedicine and the availability of in person appointments. The patient expressed understanding and agreed to proceed. I discussed the assessment and treatment plan with the patient. The patient was provided an opportunity to ask questions and all were answered. The patient agreed with the plan and demonstrated an understanding of the instructions.   The patient was advised to call back or seek an in-person evaluation if the symptoms worsen or if the condition fails to improve as anticipated.   [place] MD  OP Progress Note  09/26/2018 2:57 PM [name]  MRN:  [id number]  Chief Complaint:  Chief Complaint    Follow-up     HPI: [name] is a 58 year old female who has a history of MDD, GAD, PTSD was evaluated by telemedicine today.  Patient is married and lives in [place].  Patient today reports she is currently struggling with anxiety symptoms.  She feels nervous all the time.  She reports she is currently going through a lot of psychosocial stressors.  She reports her husband's health issues makes her anxious.  Her husband also has anxiety disorder and that also kind of adds on to her own anxiety.  She reports she has been seeing Ms. Miguel Dibble for psychotherapy sessions.  She has been working with her and that has been helpful.  Patient reports she has been compliant on the Lexapro, Seroquel and BuSpar.  She currently denies any side effects.  However she wonders if Seroquel has any impact on her cardiac health.  Discussed with her the impact on her QT and racing heart rate.  Discussed with her to get an EKG since the last EKG was done in 2018.  She reports she will get it done from her primary care provider.  Patient  denies any suicidality, homicidality or perceptual disturbances.   Visit Diagnosis:    ICD-10-CM   1. GAD (generalized anxiety disorder)  F41.1 EKG 12-Lead    hydrOXYzine (ATARAX/VISTARIL) 25 MG tablet  2. Post traumatic stress disorder (PTSD)  F43.10 EKG 12-Lead  3. MDD (major depressive disorder), recurrent episode, moderate ([place])  F33.1 EKG 12-Lead    Past Psychiatric History: I have reviewed past psychiatric history from my progress note on 07/02/2018  Past Medical History:  Past Medical History:  Diagnosis Date  . Anxiety   . Arthritis   . Asthma   . Depression   . Diverticulitis   . Heart palpitations   . Liver disease   . Migraines   . Sleep apnea     Past Surgical History:  Procedure Laterality Date  . ABDOMINAL HYSTERECTOMY    . APPENDECTOMY    . CHOLECYSTECTOMY    . COLONOSCOPY WITH PROPOFOL N/A 04/21/2015   Procedure: COLONOSCOPY WITH PROPOFOL;  Surgeon: Dawn Silvas, MD;  Location: [facility] ENDOSCOPY;  Service: Endoscopy;  Laterality: N/A;  . SINUSOTOMY    . TONSILLECTOMY      Family Psychiatric History: Reviewed family psychiatric history from my progress note on 07/02/2018 Family History:  Family History  Problem Relation Age of Onset  . Diabetes Mother   . Muscular dystrophy Mother   . Heart failure Mother   . Anxiety disorder Mother   . Skin cancer Father   .  Heart disease Father   . Hypertension Father   . Depression Father   . Alcohol abuse Father   . Drug abuse Father   . Heart disease Sister   . Diabetes Sister   . Anxiety disorder Sister   . Hypertension Brother   . Diverticulosis Brother   . Heart disease Sister   . Diabetes Sister   . Heart disease Sister   . Diabetes Sister     Social History: Reviewed social history from my progress note on 07/02/2018 Social History   Socioeconomic History  . Marital status: Married    Spouse name: Journalist, newspaper  . Number of children: 0  . Years of education: Not on file  . Highest education level:  High school graduate  Occupational History    Comment: disabled  Social Needs  . Financial resource strain: Not hard at all  . Food insecurity    Worry: Never true    Inability: Never true  . Transportation needs    Medical: No    Non-medical: No  Tobacco Use  . Smoking status: Former Smoker    Types: Cigarettes    Quit date: 01/15/2017    Years since quitting: 1.6  . Smokeless tobacco: Never Used  Substance and Sexual Activity  . Alcohol use: No    Alcohol/week: 0.0 standard drinks  . Drug use: No  . Sexual activity: Yes    Birth control/protection: None  Lifestyle  . Physical activity    Days per week: 0 days    Minutes per session: 0 min  . Stress: Rather much  Relationships  . Social connections    Talks on phone: More than three times a week    Gets together: Once a week    Attends religious service: Never    Active member of club or organization: No    Attends meetings of clubs or organizations: Never    Relationship status: Married  Other Topics Concern  . Not on file  Social History Narrative  . Not on file    Allergies:  Allergies  Allergen Reactions  . Cefotetan Rash  . Iodinated Diagnostic Agents Other (See Comments) and Hives    Uncoded Allergy. Allergen: MYCIN FAMILY Uncoded Allergy. Allergen: MULTIPLE FOODS, Other Reaction: Other reaction Uncoded Allergy. Allergen: MYCIN FAMILY Uncoded Allergy. Allergen: MULTIPLE FOODS, Other Reaction: Other reaction Uncoded Allergy. Allergen: MYCIN FAMILY Uncoded Allergy. Allergen: MULTIPLE FOODS, Other Reaction: Other reaction Uncoded Allergy. Allergen: MYCIN FAMILY Uncoded Allergy. Allergen: MULTIPLE FOODS, Other Reaction: Other reaction  . Other Anaphylaxis    Allergic to seafood  . Promethazine Hives  . Promethazine Hcl Hives  . Propoxyphene Other (See Comments)  . Propoxyphene Other (See Comments)  . Sulfa Antibiotics Hives  . Penicillins Hives  . Saccharin Hives  . Aminoglycosides Hives  .  Cefuroxime   . Eggs Or Egg-Derived Products   . Influenza Vaccines Swelling    Redness and swelling of site. Pain and fever.   . Latex   . Red Dye     Metabolic Disorder Labs: No results found for: HGBA1C, MPG No results found for: PROLACTIN No results found for: CHOL, TRIG, HDL, CHOLHDL, VLDL, LDLCALC No results found for: TSH  Therapeutic Level Labs: No results found for: LITHIUM No results found for: VALPROATE No components found for:  CBMZ  Current Medications: Current Outpatient Medications  Medication Sig Dispense Refill  . ADVAIR HFA 230-21 MCG/ACT inhaler INHALE 2 PUFFS INTO THE LUNGS TWICE DAILY 12 g 5  .  ADVAIR HFA 230-21 MCG/ACT inhaler     . albuterol (VENTOLIN HFA) 108 (90 Base) MCG/ACT inhaler INHALE 2 PUFFS INTO THE LUNGS EVERY 6 HOURS AS NEEDED FOR WHEEZING    . aspirin EC 81 MG tablet Take 81 mg by mouth daily.    . busPIRone (BUSPAR) 15 MG tablet Take 1 tablet (15 mg total) by mouth 2 (two) times daily. 60 tablet 1  . cetirizine (ZYRTEC ALLERGY) 10 MG tablet Take 20 mg by mouth daily.    Marland Kitchen escitalopram (LEXAPRO) 20 MG tablet TAKE 1 TABLET(20 MG) BY MOUTH DAILY 90 tablet 0  . hydrOXYzine (ATARAX/VISTARIL) 25 MG tablet Take 0.5-1 tablets (12.5-25 mg total) by mouth 2 (two) times daily as needed. Only for anxiety attacks 60 tablet 1  . metoprolol succinate (TOPROL-XL) 25 MG 24 hr tablet Take 12.5 mg by mouth daily.   3  . metoprolol succinate (TOPROL-XL) 25 MG 24 hr tablet     . montelukast (SINGULAIR) 10 MG tablet Take 10 mg by mouth at bedtime.    . nitroGLYCERIN (NITROSTAT) 0.4 MG SL tablet Place 0.4 mg under the tongue every 5 (five) minutes as needed.     Marland Kitchen oxyCODONE (OXY IR/ROXICODONE) 5 MG immediate release tablet Take by mouth.    . QUEtiapine (SEROQUEL) 50 MG tablet Take 0.5 tablets (25 mg total) by mouth at bedtime. 45 tablet 1  . rosuvastatin (CRESTOR) 5 MG tablet Take 5 mg by mouth daily.     Marland Kitchen SINGULAIR 10 MG tablet     . topiramate (TOPAMAX) 200 MG  tablet Take 200 mg by mouth daily.     . Vitamin D, Ergocalciferol, (DRISDOL) 50000 units CAPS capsule TAKE 1 CAPSULE BY MOUTH 1 TIME A WEEK     No current facility-administered medications for this visit.      Musculoskeletal: Strength & Muscle Tone: UTA Gait & Station: Observed as seated Patient leans: N/A  Psychiatric Specialty Exam: Review of Systems  Psychiatric/Behavioral: The patient is nervous/anxious.   All other systems reviewed and are negative.   There were no vitals taken for this visit.There is no height or weight on file to calculate BMI.  General Appearance: Casual  Eye Contact:  Fair  Speech:  Clear and Coherent  Volume:  Normal  Mood:  Anxious  Affect:  Congruent  Thought Process:  Goal Directed and Descriptions of Associations: Intact  Orientation:  Full (Time, Place, and Person)  Thought Content: Logical   Suicidal Thoughts:  No  Homicidal Thoughts:  No  Memory:  Immediate;   Fair Recent;   Fair Remote;   Fair  Judgement:  Fair  Insight:  Fair  Psychomotor Activity:  Normal  Concentration:  Concentration: Fair and Attention Span: Fair  Recall:  [employer] of Knowledge: Fair  Language: Fair  Akathisia:  No  Handed:  Right  AIMS (if indicated): denies tremors, rigidity  Assets:  Communication Skills Desire for Improvement Social Support  ADL's:  Intact  Cognition: WNL  Sleep:  Fair   Screenings:   Assessment and Plan: Elvia is a 58 year old Caucasian female, married, lives in [place], has a history of PTSD, MDD, GAD was evaluated by telemedicine today.  She is biologically predisposed given her history of trauma.  Patient has psychosocial stressors of her husband's health issues, current pandemic.  She continues to struggle with anxiety symptoms and will benefit from medication readjustment as well as psychotherapy sessions.  Plan as noted below.  Plan GAD Lexapro 20 mg p.o.  daily Patient to continue psychotherapy sessions with Ms. Miguel Dibble Add vistaril 12.5 - 25 mg po bid prn for anxiety attacks.  PTSD Continue Seroquel 25 mg po qhs and Lexapro. Continue CBT  For MDD-improving Lexapro 20 mg p.o. daily Seroquel as prescribed.  Follow-up in clinic in 4 weeks or sooner if needed.Oct 12, 2:30 pm  I have spent atleast 15 minutes non face to face with patient today. More than 50 % of the time was spent for psychoeducation and supportive psychotherapy and care coordination. This note was generated in part or whole with voice recognition software. Voice recognition is usually quite accurate but there are transcription errors that can and very often do occur. I apologize for any typographical errors that were not detected and corrected.         Ursula Alert, MD 09/26/2018, 2:57 PM

## 2018-10-28 ENCOUNTER — Ambulatory Visit: Payer: Medicare Other | Admitting: Psychiatry

## 2018-11-11 ENCOUNTER — Ambulatory Visit (INDEPENDENT_AMBULATORY_CARE_PROVIDER_SITE_OTHER): Payer: Medicare Other | Admitting: Psychiatry

## 2018-11-11 ENCOUNTER — Other Ambulatory Visit: Payer: Self-pay

## 2018-11-11 ENCOUNTER — Encounter: Payer: Self-pay | Admitting: Psychiatry

## 2018-11-11 DIAGNOSIS — F4310 Post-traumatic stress disorder, unspecified: Secondary | ICD-10-CM

## 2018-11-11 DIAGNOSIS — F411 Generalized anxiety disorder: Secondary | ICD-10-CM

## 2018-11-11 DIAGNOSIS — F331 Major depressive disorder, recurrent, moderate: Secondary | ICD-10-CM

## 2018-11-11 MED ORDER — BUSPIRONE HCL 15 MG PO TABS: 15.0000 mg | ORAL_TABLET | Freq: Two times a day (BID) | ORAL | 1 refills | Status: DC

## 2018-11-11 MED ORDER — ESCITALOPRAM OXALATE 20 MG PO TABS: ORAL_TABLET | ORAL | 1 refills | Status: DC

## 2018-11-11 NOTE — Progress Notes (Signed)
Virtual Visit via Video Note  I connected with [name] on 11/11/18 at 10:15 AM EDT by a video enabled telemedicine application and verified that I am speaking with the correct person using two identifiers.   I discussed the limitations of evaluation and management by telemedicine and the availability of in person appointments. The patient expressed understanding and agreed to proceed.  I discussed the assessment and treatment plan with the patient. The patient was provided an opportunity to ask questions and all were answered. The patient agreed with the plan and demonstrated an understanding of the instructions.   The patient was advised to call back or seek an in-person evaluation if the symptoms worsen or if the condition fails to improve as anticipated.  [facility] MD OP Progress Note  11/11/2018 2:58 PM [name]  MRN:  [id number]  Chief Complaint:  Chief Complaint    Follow-up     HPI: [name] is a 58 year old female, married, lives in [place], has a history of GAD, PTSD, MDD, was evaluated by telemedicine today.  Patient today reports she is currently doing well with regards to her mood.  She does not feel very anxious or depressed at this time.  She denies any significant racing thoughts and is able to cope with her flashbacks better.  She reports sleep is good.  Patient reports she continues to work with her therapist Ms. Miguel Dibble and it is helpful.  Patient is compliant on her medications as prescribed.  She is tolerating the Seroquel well.  She reports her Seroquel helps with her sleep.  Patient reports she was unable to get EKG done and will get it done soon.  Patient denies any suicidality, homicidality or perceptual disturbances.  Patient denies any other concerns today. Visit Diagnosis:    ICD-10-CM   1. GAD (generalized anxiety disorder)  F41.1 busPIRone (BUSPAR) 15 MG tablet    escitalopram (LEXAPRO) 20 MG tablet   stable  2. Post traumatic stress disorder  (PTSD)  F43.10 busPIRone (BUSPAR) 15 MG tablet    escitalopram (LEXAPRO) 20 MG tablet   stable  3. MDD (major depressive disorder), recurrent episode, moderate (HCC)  F33.1 busPIRone (BUSPAR) 15 MG tablet    escitalopram (LEXAPRO) 20 MG tablet   improving    Past Psychiatric History: I have reviewed past psychiatric history from my progress note on 07/02/2018  Past Medical History:  Past Medical History:  Diagnosis Date  . Anxiety   . Arthritis   . Asthma   . Depression   . Diverticulitis   . Heart palpitations   . Liver disease   . Migraines   . Sleep apnea     Past Surgical History:  Procedure Laterality Date  . ABDOMINAL HYSTERECTOMY    . APPENDECTOMY    . CHOLECYSTECTOMY    . COLONOSCOPY WITH PROPOFOL N/A 04/21/2015   Procedure: COLONOSCOPY WITH PROPOFOL;  Surgeon: Manya Silvas, MD;  Location: [facility] ENDOSCOPY;  Service: Endoscopy;  Laterality: N/A;  . SINUSOTOMY    . TONSILLECTOMY      Family Psychiatric History: I have reviewed family psychiatric history from my progress note on 07/02/2018  Family History:  Family History  Problem Relation Age of Onset  . Diabetes Mother   . Muscular dystrophy Mother   . Heart failure Mother   . Anxiety disorder Mother   . Skin cancer Father   . Heart disease Father   . Hypertension Father   . Depression Father   . Alcohol abuse  Father   . Drug abuse Father   . Heart disease Sister   . Diabetes Sister   . Anxiety disorder Sister   . Hypertension Brother   . Diverticulosis Brother   . Heart disease Sister   . Diabetes Sister   . Heart disease Sister   . Diabetes Sister     Social History: Reviewed social history from my progress note on 07/02/2018 Social History   Socioeconomic History  . Marital status: Married    Spouse name: Journalist, newspaper  . Number of children: 0  . Years of education: Not on file  . Highest education level: High school graduate  Occupational History    Comment: disabled  Social Needs  .  Financial resource strain: Not hard at all  . Food insecurity    Worry: Never true    Inability: Never true  . Transportation needs    Medical: No    Non-medical: No  Tobacco Use  . Smoking status: Former Smoker    Types: Cigarettes    Quit date: 01/15/2017    Years since quitting: 1.8  . Smokeless tobacco: Never Used  Substance and Sexual Activity  . Alcohol use: No    Alcohol/week: 0.0 standard drinks  . Drug use: No  . Sexual activity: Yes    Birth control/protection: None  Lifestyle  . Physical activity    Days per week: 0 days    Minutes per session: 0 min  . Stress: Rather much  Relationships  . Social connections    Talks on phone: More than three times a week    Gets together: Once a week    Attends religious service: Never    Active member of club or organization: No    Attends meetings of clubs or organizations: Never    Relationship status: Married  Other Topics Concern  . Not on file  Social History Narrative  . Not on file    Allergies:  Allergies  Allergen Reactions  . Cefotetan Rash  . Iodinated Diagnostic Agents Other (See Comments) and Hives    Uncoded Allergy. Allergen: MYCIN FAMILY Uncoded Allergy. Allergen: MULTIPLE FOODS, Other Reaction: Other reaction Uncoded Allergy. Allergen: MYCIN FAMILY Uncoded Allergy. Allergen: MULTIPLE FOODS, Other Reaction: Other reaction Uncoded Allergy. Allergen: MYCIN FAMILY Uncoded Allergy. Allergen: MULTIPLE FOODS, Other Reaction: Other reaction Uncoded Allergy. Allergen: MYCIN FAMILY Uncoded Allergy. Allergen: MULTIPLE FOODS, Other Reaction: Other reaction  . Other Anaphylaxis    Allergic to seafood  . Promethazine Hives  . Promethazine Hcl Hives  . Propoxyphene Other (See Comments)  . Propoxyphene Other (See Comments)  . Sulfa Antibiotics Hives  . Penicillins Hives  . Saccharin Hives  . Aminoglycosides Hives  . Cefuroxime   . Eggs Or Egg-Derived Products   . Influenza Vaccines Swelling    Redness and  swelling of site. Pain and fever.   . Latex   . Red Dye     Metabolic Disorder Labs: No results found for: HGBA1C, MPG No results found for: PROLACTIN No results found for: CHOL, TRIG, HDL, CHOLHDL, VLDL, LDLCALC No results found for: TSH  Therapeutic Level Labs: No results found for: LITHIUM No results found for: VALPROATE No components found for:  CBMZ  Current Medications: Current Outpatient Medications  Medication Sig Dispense Refill  . topiramate (TOPAMAX) 200 MG tablet Take by mouth.    . ADVAIR HFA 230-21 MCG/ACT inhaler INHALE 2 PUFFS INTO THE LUNGS TWICE DAILY 12 g 5  . ADVAIR HFA 230-21 MCG/ACT inhaler     .  albuterol (VENTOLIN HFA) 108 (90 Base) MCG/ACT inhaler INHALE 2 PUFFS INTO THE LUNGS EVERY 6 HOURS AS NEEDED FOR WHEEZING    . aspirin EC 81 MG tablet Take 81 mg by mouth daily.    . busPIRone (BUSPAR) 15 MG tablet Take 1 tablet (15 mg total) by mouth 2 (two) times daily. 180 tablet 1  . cetirizine (ZYRTEC ALLERGY) 10 MG tablet Take 20 mg by mouth daily.    Marland Kitchen escitalopram (LEXAPRO) 20 MG tablet TAKE 1 TABLET(20 MG) BY MOUTH DAILY 90 tablet 1  . hydrOXYzine (ATARAX/VISTARIL) 25 MG tablet Take 0.5-1 tablets (12.5-25 mg total) by mouth 2 (two) times daily as needed. Only for anxiety attacks 60 tablet 1  . metoprolol succinate (TOPROL-XL) 25 MG 24 hr tablet Take 12.5 mg by mouth daily.   3  . metoprolol succinate (TOPROL-XL) 25 MG 24 hr tablet     . montelukast (SINGULAIR) 10 MG tablet Take 10 mg by mouth at bedtime.    . nitroGLYCERIN (NITROSTAT) 0.4 MG SL tablet Place 0.4 mg under the tongue every 5 (five) minutes as needed.     Marland Kitchen oxyCODONE (OXY IR/ROXICODONE) 5 MG immediate release tablet Take by mouth.    . QUEtiapine (SEROQUEL) 50 MG tablet Take 0.5 tablets (25 mg total) by mouth at bedtime. 45 tablet 1  . rosuvastatin (CRESTOR) 5 MG tablet Take 5 mg by mouth daily.     Marland Kitchen SINGULAIR 10 MG tablet     . topiramate (TOPAMAX) 200 MG tablet Take 200 mg by mouth daily.      . Vitamin D, Ergocalciferol, (DRISDOL) 50000 units CAPS capsule TAKE 1 CAPSULE BY MOUTH 1 TIME A WEEK     No current facility-administered medications for this visit.      Musculoskeletal: Strength & Muscle Tone: UTA Gait & Station: normal Patient leans: N/A  Psychiatric Specialty Exam: Review of Systems  Psychiatric/Behavioral: Negative for depression, hallucinations, substance abuse and suicidal ideas. The patient is not nervous/anxious.   All other systems reviewed and are negative.   There were no vitals taken for this visit.There is no height or weight on file to calculate BMI.  General Appearance: Casual  Eye Contact:  Fair  Speech:  Clear and Coherent  Volume:  Normal  Mood:  Euthymic  Affect:  Congruent  Thought Process:  Goal Directed and Descriptions of Associations: Intact  Orientation:  Full (Time, Place, and Person)  Thought Content: Logical   Suicidal Thoughts:  No  Homicidal Thoughts:  No  Memory:  Immediate;   Fair Recent;   Fair Remote;   Fair  Judgement:  Fair  Insight:  Fair  Psychomotor Activity:  Normal  Concentration:  Concentration: Fair and Attention Span: Fair  Recall:  [employer] of Knowledge: Fair  Language: Fair  Akathisia:  No  Handed:  Right  AIMS (if indicated): Denies tremors, rigidity  Assets:  Communication Skills Desire for Improvement Social Support  ADL's:  Intact  Cognition: WNL  Sleep:  Fair   Screenings:   Assessment and Plan: [name] is a 58 year old Caucasian female, married, lives in [place], has a history of PTSD, MDD, GAD was evaluated by telemedicine today.  She is biologically predisposed given her history of trauma.  She also has psychosocial stressors of her husband's health issues and current pandemic.  Patient is currently tolerating medications well and is making progress.  Plan GAD-improving Lexapro 20 mg p.o. daily Hydroxyzine 12.5 to 25 mg p.o. twice daily as needed for anxiety  attacks  PTSD-stable Seroquel 25 mg p.o. nightly and Lexapro. Continue CBT with Ms. Miguel Dibble.  MDD-improving Lexapro and Seroquel as prescribed  Follow-up in clinic in 2 months or sooner if needed.  December 30 at 2:45 PM  I have spent atleast  15 minutes non face to face with patient today. More than 50 % of the time was spent for psychoeducation and supportive psychotherapy and care coordination. This note was generated in part or whole with voice recognition software. Voice recognition is usually quite accurate but there are transcription errors that can and very often do occur. I apologize for any typographical errors that were not detected and corrected.       Ursula Alert, MD 11/11/2018, 2:58 PM

## 2018-11-13 ENCOUNTER — Other Ambulatory Visit: Payer: Self-pay

## 2018-11-13 ENCOUNTER — Ambulatory Visit
Admission: RE | Admit: 2018-11-13 | Discharge: 2018-11-13 | Disposition: A | Discharge status: Home / Self Care | Payer: Medicare Other | Source: Ambulatory Visit | Attending: Internal Medicine | Admitting: Internal Medicine

## 2018-11-13 DIAGNOSIS — Z1231 Encounter for screening mammogram for malignant neoplasm of breast: Secondary | ICD-10-CM | POA: Diagnosis not present

## 2019-01-15 ENCOUNTER — Other Ambulatory Visit: Payer: Self-pay

## 2019-01-15 ENCOUNTER — Encounter: Payer: Self-pay | Admitting: Psychiatry

## 2019-01-15 ENCOUNTER — Ambulatory Visit (INDEPENDENT_AMBULATORY_CARE_PROVIDER_SITE_OTHER): Payer: Medicare Other | Admitting: Psychiatry

## 2019-01-15 DIAGNOSIS — F3341 Major depressive disorder, recurrent, in partial remission: Secondary | ICD-10-CM | POA: Diagnosis not present

## 2019-01-15 DIAGNOSIS — F4310 Post-traumatic stress disorder, unspecified: Secondary | ICD-10-CM | POA: Diagnosis not present

## 2019-01-15 DIAGNOSIS — F411 Generalized anxiety disorder: Secondary | ICD-10-CM

## 2019-01-15 MED ORDER — QUETIAPINE FUMARATE 25 MG PO TABS: 25.0000 mg | ORAL_TABLET | Freq: Every day | ORAL | 1 refills | Status: DC

## 2019-01-15 NOTE — Progress Notes (Signed)
Virtual Visit via Video Note  I connected with [name] on 01/15/19 at  2:45 PM EST by a video enabled telemedicine application and verified that I am speaking with the correct person using two identifiers.   I discussed the limitations of evaluation and management by telemedicine and the availability of in person appointments. The patient expressed understanding and agreed to proceed.    I discussed the assessment and treatment plan with the patient. The patient was provided an opportunity to ask questions and all were answered. The patient agreed with the plan and demonstrated an understanding of the instructions.   The patient was advised to call back or seek an in-person evaluation if the symptoms worsen or if the condition fails to improve as anticipated.   [facility] MD OP Progress Note  01/15/2019 6:07 PM [name]  MRN:  [id number]  Chief Complaint:  Chief Complaint    Follow-up     HPI: [name] is a 58 year old female, married, lives in [place], has a history of GAD, PTSD, MDD was evaluated by telemedicine today.  Patient today reports she is currently struggling with anxiety symptoms on and off.  She reports her husband is currently recovering from knee surgery.  She has to hence go to the grocery stores often.  She reports she gets anxious in grocery stores and in crowded places.  She goes into a panic mode  and hyperventilates and feels extremely anxious.  She has been making use of her coping techniques.  That does help.  The hydroxyzine also helps to some extent.  She continues to work with Ms. Miguel Dibble which has been helpful.  She denies any depressive symptoms at this time.  She reports sleep and appetite is fair.  She is compliant on medications as prescribed.  Patient denies any suicidality, homicidality or perceptual disturbances.  Patient reports she wants to stay on her current medication and does not want to make any changes today.  She denies any other  concerns today. Visit Diagnosis:    ICD-10-CM   1. GAD (generalized anxiety disorder)  F41.1 QUEtiapine (SEROQUEL) 25 MG tablet  2. Post traumatic stress disorder (PTSD)  F43.10 QUEtiapine (SEROQUEL) 25 MG tablet  3. MDD (major depressive disorder), recurrent, in partial remission (HCC)  F33.41 QUEtiapine (SEROQUEL) 25 MG tablet    Past Psychiatric History: I have reviewed past psychiatric history from my progress note on 07/02/2018.  Past Medical History:  Past Medical History:  Diagnosis Date  . Anxiety   . Arthritis   . Asthma   . Depression   . Diverticulitis   . Heart palpitations   . Liver disease   . Migraines   . Sleep apnea     Past Surgical History:  Procedure Laterality Date  . ABDOMINAL HYSTERECTOMY    . APPENDECTOMY    . CHOLECYSTECTOMY    . COLONOSCOPY WITH PROPOFOL N/A 04/21/2015   Procedure: COLONOSCOPY WITH PROPOFOL;  Surgeon: Manya Silvas, MD;  Location: [facility] ENDOSCOPY;  Service: Endoscopy;  Laterality: N/A;  . SINUSOTOMY    . TONSILLECTOMY      Family Psychiatric History: I have reviewed family psychiatric history from my progress note on 07/02/2018.  Family History:  Family History  Problem Relation Age of Onset  . Diabetes Mother   . Muscular dystrophy Mother   . Heart failure Mother   . Anxiety disorder Mother   . Skin cancer Father   . Heart disease Father   . Hypertension Father   .  Depression Father   . Alcohol abuse Father   . Drug abuse Father   . Heart disease Sister   . Diabetes Sister   . Anxiety disorder Sister   . Hypertension Brother   . Diverticulosis Brother   . Heart disease Sister   . Diabetes Sister   . Heart disease Sister   . Diabetes Sister   . Breast cancer Paternal Aunt     Social History: I have reviewed social history from my progress note on 07/02/2018. Social History   Socioeconomic History  . Marital status: Married    Spouse name: Journalist, newspaper  . Number of children: 0  . Years of education: Not on file  .  Highest education level: High school graduate  Occupational History    Comment: disabled  Tobacco Use  . Smoking status: Former Smoker    Types: Cigarettes    Quit date: 01/15/2017    Years since quitting: 2.0  . Smokeless tobacco: Never Used  Substance and Sexual Activity  . Alcohol use: No    Alcohol/week: 0.0 standard drinks  . Drug use: No  . Sexual activity: Yes    Birth control/protection: None  Other Topics Concern  . Not on file  Social History Narrative  . Not on file   Social Determinants of Health   Financial Resource Strain:   . Difficulty of Paying Living Expenses: Not on file  Food Insecurity:   . Worried About Charity fundraiser in the Last Year: Not on file  . Ran Out of Food in the Last Year: Not on file  Transportation Needs:   . Lack of Transportation (Medical): Not on file  . Lack of Transportation (Non-Medical): Not on file  Physical Activity:   . Days of Exercise per Week: Not on file  . Minutes of Exercise per Session: Not on file  Stress:   . Feeling of Stress : Not on file  Social Connections:   . Frequency of Communication with Friends and Family: Not on file  . Frequency of Social Gatherings with Friends and Family: Not on file  . Attends Religious Services: Not on file  . Active Member of Clubs or Organizations: Not on file  . Attends Archivist Meetings: Not on file  . Marital Status: Not on file    Allergies:  Allergies  Allergen Reactions  . Cefotetan Rash  . Iodinated Diagnostic Agents Other (See Comments) and Hives    Uncoded Allergy. Allergen: MYCIN FAMILY Uncoded Allergy. Allergen: MULTIPLE FOODS, Other Reaction: Other reaction Uncoded Allergy. Allergen: MYCIN FAMILY Uncoded Allergy. Allergen: MULTIPLE FOODS, Other Reaction: Other reaction Uncoded Allergy. Allergen: MYCIN FAMILY Uncoded Allergy. Allergen: MULTIPLE FOODS, Other Reaction: Other reaction Uncoded Allergy. Allergen: MYCIN FAMILY Uncoded Allergy.  Allergen: MULTIPLE FOODS, Other Reaction: Other reaction  . Other Anaphylaxis    Allergic to seafood  . Promethazine Hives  . Promethazine Hcl Hives  . Propoxyphene Other (See Comments)  . Propoxyphene Other (See Comments)  . Sulfa Antibiotics Hives  . Penicillins Hives  . Saccharin Hives  . Aminoglycosides Hives  . Cefuroxime   . Eggs Or Egg-Derived Products   . Influenza Vaccines Swelling    Redness and swelling of site. Pain and fever.   . Latex   . Red Dye     Metabolic Disorder Labs: No results found for: HGBA1C, MPG No results found for: PROLACTIN No results found for: CHOL, TRIG, HDL, CHOLHDL, VLDL, LDLCALC No results found for: TSH  Therapeutic Level Labs:  No results found for: LITHIUM No results found for: VALPROATE No components found for:  CBMZ  Current Medications: Current Outpatient Medications  Medication Sig Dispense Refill  . ADVAIR HFA 230-21 MCG/ACT inhaler INHALE 2 PUFFS INTO THE LUNGS TWICE DAILY 12 g 5  . ADVAIR HFA 230-21 MCG/ACT inhaler     . albuterol (VENTOLIN HFA) 108 (90 Base) MCG/ACT inhaler INHALE 2 PUFFS INTO THE LUNGS EVERY 6 HOURS AS NEEDED FOR WHEEZING    . aspirin EC 81 MG tablet Take 81 mg by mouth daily.    . busPIRone (BUSPAR) 15 MG tablet Take 1 tablet (15 mg total) by mouth 2 (two) times daily. 180 tablet 1  . cetirizine (ZYRTEC ALLERGY) 10 MG tablet Take 20 mg by mouth daily.    Marland Kitchen DEXILANT 60 MG capsule Take 1 capsule by mouth daily.    Marland Kitchen escitalopram (LEXAPRO) 20 MG tablet TAKE 1 TABLET(20 MG) BY MOUTH DAILY 90 tablet 1  . hydrOXYzine (ATARAX/VISTARIL) 25 MG tablet Take 0.5-1 tablets (12.5-25 mg total) by mouth 2 (two) times daily as needed. Only for anxiety attacks 60 tablet 1  . metoprolol succinate (TOPROL-XL) 25 MG 24 hr tablet Take 12.5 mg by mouth daily.   3  . metoprolol succinate (TOPROL-XL) 25 MG 24 hr tablet     . montelukast (SINGULAIR) 10 MG tablet Take 10 mg by mouth at bedtime.    . nitroGLYCERIN (NITROSTAT) 0.4 MG SL  tablet Place 0.4 mg under the tongue every 5 (five) minutes as needed.     Marland Kitchen oxyCODONE (OXY IR/ROXICODONE) 5 MG immediate release tablet Take by mouth.    . QUEtiapine (SEROQUEL) 25 MG tablet Take 1 tablet (25 mg total) by mouth at bedtime. 90 tablet 1  . rosuvastatin (CRESTOR) 5 MG tablet Take 5 mg by mouth daily.     Marland Kitchen SINGULAIR 10 MG tablet     . topiramate (TOPAMAX) 200 MG tablet Take 200 mg by mouth daily.     Marland Kitchen topiramate (TOPAMAX) 200 MG tablet Take by mouth.    . Vitamin D, Ergocalciferol, (DRISDOL) 50000 units CAPS capsule TAKE 1 CAPSULE BY MOUTH 1 TIME A WEEK     No current facility-administered medications for this visit.     Musculoskeletal: Strength & Muscle Tone: UTA Gait & Station: normal Patient leans: N/A  Psychiatric Specialty Exam: Review of Systems  Psychiatric/Behavioral: The patient is nervous/anxious.   All other systems reviewed and are negative.   There were no vitals taken for this visit.There is no height or weight on file to calculate BMI.  General Appearance: Casual  Eye Contact:  Fair  Speech:  Clear and Coherent  Volume:  Normal  Mood:  Anxious  Affect:  Congruent  Thought Process:  Goal Directed and Descriptions of Associations: Intact  Orientation:  Full (Time, Place, and Person)  Thought Content: Logical   Suicidal Thoughts:  No  Homicidal Thoughts:  No  Memory:  Immediate;   Fair Recent;   Fair Remote;   Fair  Judgement:  Fair  Insight:  Fair  Psychomotor Activity:  Normal  Concentration:  Concentration: Fair and Attention Span: Fair  Recall:  [employer] of Knowledge: Fair  Language: Fair  Akathisia:  No  Handed:  Right  AIMS (if indicated): Denies tremors, rigidity  Assets:  Communication Skills Desire for Improvement Housing Social Support  ADL's:  Intact  Cognition: WNL  Sleep:  Fair   Screenings:   Assessment and Plan: Arliss is a 58 year old  Caucasian female, married, lives in [place], has a history of PTSD, MDD, GAD was  evaluated by telemedicine today.  Patient is biologically predisposed given her history of trauma.  She also has psychosocial stressors of her husband's health issues and the current pandemic.  Patient is currently making progress although she continues to struggle with anxiety attacks.  She will continue to work with her therapist.  Plan as noted below.  Plan GAD-improving Lexapro 20 mg p.o. daily Hydroxyzine 12.5 to 25 mg p.o. twice daily as needed for anxiety attacks BuSpar 15 mg p.o. twice daily.  PTSD-stable Seroquel 25 mg p.o. nightly and Lexapro as prescribed Continue CBT.  MDD-improving Lexapro and Seroquel as prescribed  Follow-up in clinic in 3 months or sooner if needed.  April 5 at 2 PM  I have spent atleast 15 minutes non face to face with patient today. More than 50 % of the time was spent for psychoeducation and supportive psychotherapy and care coordination. This note was generated in part or whole with voice recognition software. Voice recognition is usually quite accurate but there are transcription errors that can and very often do occur. I apologize for any typographical errors that were not detected and corrected.       Ursula Alert, MD 01/15/2019, 6:07 PM

## 2019-01-16 ENCOUNTER — Telehealth: Payer: Self-pay

## 2019-01-16 DIAGNOSIS — F411 Generalized anxiety disorder: Secondary | ICD-10-CM

## 2019-01-16 DIAGNOSIS — F4310 Post-traumatic stress disorder, unspecified: Secondary | ICD-10-CM

## 2019-01-16 MED ORDER — QUETIAPINE FUMARATE 50 MG PO TABS: 25.0000 mg | ORAL_TABLET | Freq: Every day | ORAL | 2 refills | Status: DC

## 2019-01-16 NOTE — Telephone Encounter (Signed)
I have sent Seroquel 50 mg to pharmacy.

## 2019-01-16 NOTE — Telephone Encounter (Signed)
received a fax that patient has a special request for medication to be switched to a 50mg .   If you agree please submit a new rx with corrected dosage.

## 2019-03-10 ENCOUNTER — Other Ambulatory Visit
Admission: RE | Admit: 2019-03-10 | Discharge: 2019-03-10 | Disposition: A | Discharge status: Home / Self Care | Payer: Medicare PPO | Source: Ambulatory Visit | Attending: Internal Medicine | Admitting: Internal Medicine

## 2019-03-10 DIAGNOSIS — Z01812 Encounter for preprocedural laboratory examination: Secondary | ICD-10-CM | POA: Diagnosis present

## 2019-03-10 DIAGNOSIS — Z20822 Contact with and (suspected) exposure to covid-19: Secondary | ICD-10-CM | POA: Diagnosis not present

## 2019-03-11 LAB — SARS CORONAVIRUS 2 (TAT 6-24 HRS): SARS Coronavirus 2: NEGATIVE

## 2019-03-13 ENCOUNTER — Encounter: Payer: Self-pay | Admitting: Internal Medicine

## 2019-03-13 ENCOUNTER — Other Ambulatory Visit: Payer: Self-pay

## 2019-03-13 ENCOUNTER — Encounter
Admission: RE | Disposition: A | Discharge status: Home / Self Care | Payer: Self-pay | Source: Home / Self Care | Attending: Internal Medicine

## 2019-03-13 ENCOUNTER — Ambulatory Visit
Admission: RE | Admit: 2019-03-13 | Discharge: 2019-03-13 | Disposition: A | Discharge status: Home / Self Care | Payer: Medicare PPO | Attending: Internal Medicine | Admitting: Internal Medicine

## 2019-03-13 ENCOUNTER — Ambulatory Visit: Payer: Medicare PPO

## 2019-03-13 DIAGNOSIS — K5730 Diverticulosis of large intestine without perforation or abscess without bleeding: Secondary | ICD-10-CM | POA: Insufficient documentation

## 2019-03-13 DIAGNOSIS — Z86010 Personal history of colonic polyps: Secondary | ICD-10-CM | POA: Diagnosis not present

## 2019-03-13 DIAGNOSIS — K219 Gastro-esophageal reflux disease without esophagitis: Secondary | ICD-10-CM | POA: Insufficient documentation

## 2019-03-13 DIAGNOSIS — K2970 Gastritis, unspecified, without bleeding: Secondary | ICD-10-CM | POA: Insufficient documentation

## 2019-03-13 DIAGNOSIS — Z1211 Encounter for screening for malignant neoplasm of colon: Secondary | ICD-10-CM | POA: Diagnosis not present

## 2019-03-13 DIAGNOSIS — Z6841 Body Mass Index (BMI) 40.0 and over, adult: Secondary | ICD-10-CM | POA: Diagnosis not present

## 2019-03-13 DIAGNOSIS — G4730 Sleep apnea, unspecified: Secondary | ICD-10-CM | POA: Diagnosis not present

## 2019-03-13 DIAGNOSIS — M1990 Unspecified osteoarthritis, unspecified site: Secondary | ICD-10-CM | POA: Insufficient documentation

## 2019-03-13 DIAGNOSIS — Z7951 Long term (current) use of inhaled steroids: Secondary | ICD-10-CM | POA: Diagnosis not present

## 2019-03-13 DIAGNOSIS — Z87891 Personal history of nicotine dependence: Secondary | ICD-10-CM | POA: Insufficient documentation

## 2019-03-13 DIAGNOSIS — F329 Major depressive disorder, single episode, unspecified: Secondary | ICD-10-CM | POA: Diagnosis not present

## 2019-03-13 DIAGNOSIS — F419 Anxiety disorder, unspecified: Secondary | ICD-10-CM | POA: Insufficient documentation

## 2019-03-13 DIAGNOSIS — Z79899 Other long term (current) drug therapy: Secondary | ICD-10-CM | POA: Diagnosis not present

## 2019-03-13 DIAGNOSIS — Z7982 Long term (current) use of aspirin: Secondary | ICD-10-CM | POA: Insufficient documentation

## 2019-03-13 DIAGNOSIS — I10 Essential (primary) hypertension: Secondary | ICD-10-CM | POA: Diagnosis not present

## 2019-03-13 DIAGNOSIS — J45909 Unspecified asthma, uncomplicated: Secondary | ICD-10-CM | POA: Insufficient documentation

## 2019-03-13 DIAGNOSIS — I2510 Atherosclerotic heart disease of native coronary artery without angina pectoris: Secondary | ICD-10-CM | POA: Insufficient documentation

## 2019-03-13 HISTORY — DX: Other complications of anesthesia, initial encounter: T88.59XA

## 2019-03-13 HISTORY — DX: Other specified postprocedural states: Z98.890

## 2019-03-13 HISTORY — PX: COLONOSCOPY WITH PROPOFOL: SHX5780

## 2019-03-13 HISTORY — PX: ESOPHAGOGASTRODUODENOSCOPY (EGD) WITH PROPOFOL: SHX5813

## 2019-03-13 SURGERY — ESOPHAGOGASTRODUODENOSCOPY (EGD) WITH PROPOFOL
Anesthesia: General

## 2019-03-13 MED ORDER — SODIUM CHLORIDE 0.9 % IV SOLN
INTRAVENOUS | Status: DC
  Administered 2019-03-13: 08:00:00 1000 mL via INTRAVENOUS

## 2019-03-13 MED ORDER — PROPOFOL 10 MG/ML IV BOLUS
INTRAVENOUS | Status: AC
  Filled 2019-03-13: qty 40

## 2019-03-13 MED ORDER — PROPOFOL 500 MG/50ML IV EMUL
INTRAVENOUS | Status: DC | PRN
  Administered 2019-03-13: 50 mg via INTRAVENOUS
  Administered 2019-03-13: 100 ug/kg/min via INTRAVENOUS

## 2019-03-13 MED ORDER — GLYCOPYRROLATE 0.2 MG/ML IJ SOLN
INTRAMUSCULAR | Status: DC | PRN
  Administered 2019-03-13: .2 mg via INTRAVENOUS

## 2019-03-13 MED ORDER — DEXMEDETOMIDINE HCL IN NACL 80 MCG/20ML IV SOLN
INTRAVENOUS | Status: AC
  Filled 2019-03-13: qty 20

## 2019-03-13 MED ORDER — LIDOCAINE HCL (CARDIAC) PF 100 MG/5ML IV SOSY
PREFILLED_SYRINGE | INTRAVENOUS | Status: DC | PRN
  Administered 2019-03-13: 60 mg via INTRAVENOUS

## 2019-03-13 NOTE — Op Note (Signed)
[facility] Gastroenterology Patient Name: [name] Procedure Date: 03/13/2019 8:34 AM MRN: [id number] Account #: [id number] Date of Birth: Oct 18, 1960 Admit Type: Outpatient Age: 59 Room: [facility] ENDO ROOM 3 Gender: Female Note Status: Finalized Procedure:             Colonoscopy Indications:           Surveillance: Personal history of adenomatous polyps                         on last colonoscopy > 5 years ago Providers:             Lorie Apley K. Alice Reichert MD, MD Referring MD:          Tracie Harrier, MD (Referring MD) Medicines:             Propofol per Anesthesia Complications:         No immediate complications. Procedure:             Pre-Anesthesia Assessment:                        - The risks and benefits of the procedure and the                         sedation options and risks were discussed with the                         patient. All questions were answered and informed                         consent was obtained.                        - Patient identification and proposed procedure were                         verified prior to the procedure by the nurse. The                         procedure was verified in the procedure room.                        - ASA Grade Assessment: III - A patient with severe                         systemic disease.                        - After reviewing the risks and benefits, the patient                         was deemed in satisfactory condition to undergo the                         procedure.                        After obtaining informed consent, the colonoscope was                         passed under direct vision.  Throughout the procedure,                         the patient's blood pressure, pulse, and oxygen                         saturations were monitored continuously. The                         Colonoscope was introduced through the anus and                         advanced to the the cecum, identified  by appendiceal                         orifice and ileocecal valve. The colonoscopy was                         performed without difficulty. The patient tolerated                         the procedure well. The quality of the bowel                         preparation was adequate. The ileocecal valve,                         appendiceal orifice, and rectum were photographed. Findings:      The perianal and digital rectal examinations were normal. Pertinent       negatives include normal sphincter tone and no palpable rectal lesions.      Many small and large-mouthed diverticula were found in the left colon.      The exam was otherwise without abnormality on direct and retroflexion       views.      Non-bleeding internal hemorrhoids were found during retroflexion. The       hemorrhoids were Grade I (internal hemorrhoids that do not prolapse). Impression:            - Diverticulosis in the left colon.                        - The examination was otherwise normal on direct and                         retroflexion views.                        - No specimens collected. Recommendation:        - Patient has a contact number available for                         emergencies. The signs and symptoms of potential                         delayed complications were discussed with the patient.                         Return to normal activities tomorrow. Written  discharge instructions were provided to the patient.                        - Resume previous diet.                        - Continue present medications.                        - Repeat colonoscopy in 5 years for surveillance.                        - Return to GI office PRN.                        - Await pathology results from EGD, also performed                         today.                        - Return to GI office in 1 year. Procedure Code(s):     --- Professional ---                        [id number],  Colorectal cancer screening; colonoscopy on                         individual at high risk Diagnosis Code(s):     --- Professional ---                        K57.30, Diverticulosis of large intestine without                         perforation or abscess without bleeding                        Z86.010, Personal history of colonic polyps CPT copyright 2019 [employer]. All rights reserved. The codes documented in this report are preliminary and upon coder review may  be revised to meet current compliance requirements. Efrain Sella MD, MD 03/13/2019 9:05:06 AM This report has been signed electronically. Number of Addenda: 0 Note Initiated On: 03/13/2019 8:34 AM Scope Withdrawal Time: 0 hours 5 minutes 12 seconds  Total Procedure Duration: 0 hours 7 minutes 49 seconds  Estimated Blood Loss:  Estimated blood loss: none. Estimated blood loss: none.      [facility]

## 2019-03-13 NOTE — H&P (Signed)
Outpatient short stay form Pre-procedure 03/13/2019 8:34 AM [name] [name], M.D.  Primary Physician: [name], M.D.  Reason for visit:  Gastric intestinal metaplasia, GERD, Hiatal hernia, personal hx of adenomatous colon polyp.  History of present illness:  Pleasant 59 y/o female presents with GERD symptoms, HH, and surveillance of GIM. Denies weight loss, dysphagia, abdominal pain. Has chronic loose stools s/p cholecystectomy, but otherwise has no lower GI symptoms.     Current Facility-Administered Medications:  .  0.9 %  sodium chloride infusion, , Intravenous, Continuous, Beardsley, Benay Pike, MD, Last Rate: 20 mL/hr at 03/13/19 0742, 1,000 mL at 03/13/19 [id number]  Medications Prior to Admission  Medication Sig Dispense Refill Last Dose  . ADVAIR HFA 230-21 MCG/ACT inhaler INHALE 2 PUFFS INTO THE LUNGS TWICE DAILY 12 g 5 03/12/2019 at Unknown time  . aspirin EC 81 MG tablet Take 81 mg by mouth daily.   Past Week at Unknown time  . busPIRone (BUSPAR) 15 MG tablet Take 1 tablet (15 mg total) by mouth 2 (two) times daily. 180 tablet 1 Past Week at Unknown time  . cetirizine (ZYRTEC ALLERGY) 10 MG tablet Take 20 mg by mouth daily.   Past Week at Unknown time  . DEXILANT 60 MG capsule Take 1 capsule by mouth daily.   Past Week at Unknown time  . escitalopram (LEXAPRO) 20 MG tablet TAKE 1 TABLET(20 MG) BY MOUTH DAILY 90 tablet 1 Past Week at Unknown time  . metoprolol succinate (TOPROL-XL) 25 MG 24 hr tablet Take 12.5 mg by mouth daily.   3 Past Week at Unknown time  . montelukast (SINGULAIR) 10 MG tablet Take 10 mg by mouth at bedtime.   Past Week at Unknown time  . topiramate (TOPAMAX) 200 MG tablet Take 200 mg by mouth daily.    Past Week at Unknown time  . Vitamin D, Ergocalciferol, (DRISDOL) 50000 units CAPS capsule TAKE 1 CAPSULE BY MOUTH 1 TIME A WEEK   Past Week at Unknown time  . ADVAIR HFA 230-21 MCG/ACT inhaler      . albuterol (VENTOLIN HFA) 108 (90 Base) MCG/ACT inhaler  INHALE 2 PUFFS INTO THE LUNGS EVERY 6 HOURS AS NEEDED FOR WHEEZING    at prn  . hydrOXYzine (ATARAX/VISTARIL) 25 MG tablet Take 0.5-1 tablets (12.5-25 mg total) by mouth 2 (two) times daily as needed. Only for anxiety attacks (Patient not taking: Reported on 03/13/2019) 60 tablet 1 Not Taking at Unknown time  . metoprolol succinate (TOPROL-XL) 25 MG 24 hr tablet      . nitroGLYCERIN (NITROSTAT) 0.4 MG SL tablet Place 0.4 mg under the tongue every 5 (five) minutes as needed.      Marland Kitchen oxyCODONE (OXY IR/ROXICODONE) 5 MG immediate release tablet Take by mouth.    at prn  . QUEtiapine (SEROQUEL) 50 MG tablet Take 0.5 tablets (25 mg total) by mouth at bedtime. 45 tablet 2  at prn  . rosuvastatin (CRESTOR) 5 MG tablet Take 5 mg by mouth daily.      Marland Kitchen SINGULAIR 10 MG tablet      . topiramate (TOPAMAX) 200 MG tablet Take by mouth.        Allergies  Allergen Reactions  . Cefotetan Rash  . Iodinated Diagnostic Agents Other (See Comments) and Hives    Uncoded Allergy. Allergen: MYCIN FAMILY Uncoded Allergy. Allergen: MULTIPLE FOODS, Other Reaction: Other reaction Uncoded Allergy. Allergen: MYCIN FAMILY Uncoded Allergy. Allergen: MULTIPLE FOODS, Other Reaction: Other reaction Uncoded Allergy. Allergen: MYCIN FAMILY Uncoded  Allergy. Allergen: MULTIPLE FOODS, Other Reaction: Other reaction Uncoded Allergy. Allergen: MYCIN FAMILY Uncoded Allergy. Allergen: MULTIPLE FOODS, Other Reaction: Other reaction  . Other Anaphylaxis    Allergic to seafood  . Promethazine Hives  . Promethazine Hcl Hives  . Propoxyphene Other (See Comments)  . Propoxyphene Other (See Comments)  . Sulfa Antibiotics Hives  . Penicillins Hives  . Saccharin Hives  . Aminoglycosides Hives  . Cefuroxime   . Eggs Or Egg-Derived Products   . Influenza Vaccines Swelling    Redness and swelling of site. Pain and fever.   . Latex   . Red Dye      Past Medical History:  Diagnosis Date  . Anxiety   . Arthritis   . Asthma   .  Complication of anesthesia   . Depression   . Diverticulitis   . Heart palpitations   . Liver disease   . Migraines   . PONV (postoperative nausea and vomiting)   . Sleep apnea     Review of systems:  Otherwise negative.    Physical Exam  Gen: Alert, oriented. Appears stated age.  HEENT: [place]/AT. PERRLA. Lungs: CTA, no wheezes. CV: RR nl S1, S2. Abd: soft, benign, no masses. BS+ Ext: No edema. Pulses 2+    Planned procedures: Proceed with EGD and  colonoscopy. The patient understands the nature of the planned procedure, indications, risks, alternatives and potential complications including but not limited to bleeding, infection, perforation, damage to internal organs and possible oversedation/side effects from anesthesia. The patient agrees and gives consent to proceed.  Please refer to procedure notes for findings, recommendations and patient disposition/instructions.     [name] [name], M.D. Gastroenterology 03/13/2019  8:34 AM

## 2019-03-13 NOTE — Interval H&P Note (Signed)
History and Physical Interval Note:  03/13/2019 8:36 AM  [name]  has presented today for surgery, with the diagnosis of GERD,INTESTINAL METAPLASIA,PERSONAL HX.OF COLON POLYPS.  The various methods of treatment have been discussed with the patient and family. After consideration of risks, benefits and other options for treatment, the patient has consented to  Procedure(s): ESOPHAGOGASTRODUODENOSCOPY (EGD) WITH PROPOFOL (N/A) COLONOSCOPY WITH PROPOFOL (N/A) as a surgical intervention.  The patient's history has been reviewed, patient examined, no change in status, stable for surgery.  I have reviewed the patient's chart and labs.  Questions were answered to the patient's satisfaction.     [place], [place]

## 2019-03-13 NOTE — Transfer of Care (Signed)
Immediate Anesthesia Transfer of Care Note  Patient: RANIJAH SNYDERS  Procedure(s) Performed: ESOPHAGOGASTRODUODENOSCOPY (EGD) WITH PROPOFOL (N/A ) COLONOSCOPY WITH PROPOFOL (N/A )  Patient Location: PACU  Anesthesia Type:General  Level of Consciousness: awake, alert  and oriented  Airway & Oxygen Therapy: Patient Spontanous Breathing  Post-op Assessment: Report given to RN and Post -op Vital signs reviewed and stable  Post vital signs: Reviewed  Last Vitals:  Vitals Value Taken Time  BP 115/75 03/13/19 0914  Temp 36.8 C 03/13/19 0909  Pulse 73 03/13/19 0914  Resp 21 03/13/19 0914  SpO2 100 % 03/13/19 0914  Vitals shown include unvalidated device data.  Last Pain:  Vitals:   03/13/19 0909  TempSrc: Temporal  PainSc: 5          Complications: No apparent anesthesia complications

## 2019-03-13 NOTE — Anesthesia Postprocedure Evaluation (Signed)
Anesthesia Post Note  Patient: [name]  Procedure(s) Performed: ESOPHAGOGASTRODUODENOSCOPY (EGD) WITH PROPOFOL (N/A ) COLONOSCOPY WITH PROPOFOL (N/A )  Patient location during evaluation: PACU Anesthesia Type: General Level of consciousness: awake and alert Pain management: pain level controlled Vital Signs Assessment: post-procedure vital signs reviewed and stable Respiratory status: spontaneous breathing, nonlabored ventilation and respiratory function stable Cardiovascular status: blood pressure returned to baseline and stable Postop Assessment: no apparent nausea or vomiting Anesthetic complications: no     Last Vitals:  Vitals:   03/13/19 0909 03/13/19 0914  BP: 115/75 115/75  Pulse: 75 78  Resp: 17 13  Temp: 36.8 C   SpO2: 100% 99%    Last Pain:  Vitals:   03/13/19 0943  TempSrc:   PainSc: [place]

## 2019-03-13 NOTE — Anesthesia Preprocedure Evaluation (Signed)
Anesthesia Evaluation  Patient identified by MRN, date of birth, ID band Patient awake    Reviewed: Allergy & Precautions, H&P , NPO status , Patient's Chart, lab work & pertinent test results  History of Anesthesia Complications (+) PONV and history of anesthetic complications  Airway Mallampati: I  TM Distance: >3 FB Neck ROM: full    Dental  (+) Poor Dentition, Chipped   Pulmonary asthma , sleep apnea , former smoker,    breath sounds clear to auscultation       Cardiovascular hypertension, + CAD   Rhythm:regular Rate:Normal     Neuro/Psych  Headaches, PSYCHIATRIC DISORDERS Anxiety Depression    GI/Hepatic Neg liver ROS, PUD, GERD  ,  Endo/Other  Morbid obesity (BMI 50)  Renal/GU negative Renal ROS  negative genitourinary   Musculoskeletal   Abdominal   Peds  Hematology negative hematology ROS (+)   Anesthesia Other Findings Past Medical History: No date: Anxiety No date: Arthritis No date: Asthma No date: Complication of anesthesia No date: Depression No date: Diverticulitis No date: Heart palpitations No date: Liver disease No date: Migraines No date: PONV (postoperative nausea and vomiting) No date: Sleep apnea  Past Surgical History: No date: ABDOMINAL HYSTERECTOMY No date: APPENDECTOMY No date: CHOLECYSTECTOMY 04/21/2015: COLONOSCOPY WITH PROPOFOL; N/A     Comment:  Procedure: COLONOSCOPY WITH PROPOFOL;  Surgeon: Manya Silvas, MD;  Location: [facility];  Service:               Endoscopy;  Laterality: N/A; No date: SINUSOTOMY No date: TONSILLECTOMY  BMI    Body Mass Index: 49.86 kg/m      Reproductive/Obstetrics negative OB ROS                             Anesthesia Physical Anesthesia Plan  ASA: III  Anesthesia Plan: General   Post-op Pain Management:    Induction:   PONV Risk Score and Plan: Propofol infusion and TIVA  Airway  Management Planned:   Additional Equipment:   Intra-op Plan:   Post-operative Plan:   Informed Consent: I have reviewed the patients History and Physical, chart, labs and discussed the procedure including the risks, benefits and alternatives for the proposed anesthesia with the patient or authorized representative who has indicated his/her understanding and acceptance.     Dental Advisory Given  Plan Discussed with: Anesthesiologist  Anesthesia Plan Comments:         Anesthesia Quick Evaluation

## 2019-03-13 NOTE — Op Note (Signed)
[facility] Gastroenterology Patient Name: [name] Procedure Date: 03/13/2019 8:35 AM MRN: [id number] Account #: [id number] Date of Birth: 1960/09/22 Admit Type: Outpatient Age: 59 Room: [facility] ENDO ROOM 3 Gender: Female Note Status: Finalized Procedure:             Upper GI endoscopy Indications:           Gastro-esophageal reflux disease, Gastric intestinal                         metaplasia Providers:             Lorie Apley K. Alice Reichert MD, MD Referring MD:          Tracie Harrier, MD (Referring MD) Medicines:             Propofol per Anesthesia Complications:         No immediate complications. Procedure:             Pre-Anesthesia Assessment:                        - The risks and benefits of the procedure and the                         sedation options and risks were discussed with the                         patient. All questions were answered and informed                         consent was obtained.                        - Patient identification and proposed procedure were                         verified prior to the procedure by the nurse. The                         procedure was verified in the procedure room.                        - ASA Grade Assessment: III - A patient with severe                         systemic disease.                        - After reviewing the risks and benefits, the patient                         was deemed in satisfactory condition to undergo the                         procedure.                        After obtaining informed consent, the endoscope was                         passed under direct vision. Throughout the procedure,  the patient's blood pressure, pulse, and oxygen                         saturations were monitored continuously. The Endoscope                         was introduced through the mouth, and advanced to the                         third part of duodenum. The upper GI  endoscopy was                         accomplished without difficulty. The patient tolerated                         the procedure well. Findings:      The examined esophagus was normal.      Patchy mild inflammation characterized by erosions was found in the       entire examined stomach. Biopsies taken in the cardia, cardiac incisura       and antrum to survey history of gastric intestinal metaplasia.      The examined duodenum was normal. Impression:            - Normal esophagus.                        - Gastritis.                        - No specimens collected. Recommendation:        - Await pathology results.                        - Proceed with colonoscopy Procedure Code(s):     --- Professional ---                        [phone], Esophagogastroduodenoscopy, flexible,                         transoral; diagnostic, including collection of                         specimen(s) by brushing or washing, when performed                         (separate procedure) Diagnosis Code(s):     --- Professional ---                        K21.9, Gastro-esophageal reflux disease without                         esophagitis                        K29.70, Gastritis, unspecified, without bleeding CPT copyright 2019 American Medical Association. All rights reserved. The codes documented in this report are preliminary and upon coder review may  be revised to meet current compliance requirements. Efrain Sella MD, MD 03/13/2019 8:50:28 AM This report has been signed electronically. Number of Addenda: 0 Note Initiated On: 03/13/2019 8:35 AM Estimated  Blood Loss:  Estimated blood loss: none. Estimated blood loss: none.      [facility]

## 2019-03-14 ENCOUNTER — Encounter: Payer: Self-pay | Admitting: *Deleted

## 2019-03-14 LAB — SURGICAL PATHOLOGY

## 2019-03-20 DIAGNOSIS — R7303 Prediabetes: Secondary | ICD-10-CM | POA: Insufficient documentation

## 2019-04-21 ENCOUNTER — Encounter: Payer: Self-pay | Admitting: Psychiatry

## 2019-04-21 ENCOUNTER — Ambulatory Visit (INDEPENDENT_AMBULATORY_CARE_PROVIDER_SITE_OTHER): Payer: Medicare PPO | Admitting: Psychiatry

## 2019-04-21 ENCOUNTER — Other Ambulatory Visit: Payer: Self-pay

## 2019-04-21 DIAGNOSIS — F4310 Post-traumatic stress disorder, unspecified: Secondary | ICD-10-CM

## 2019-04-21 DIAGNOSIS — F331 Major depressive disorder, recurrent, moderate: Secondary | ICD-10-CM | POA: Diagnosis not present

## 2019-04-21 DIAGNOSIS — F411 Generalized anxiety disorder: Secondary | ICD-10-CM | POA: Diagnosis not present

## 2019-04-21 MED ORDER — QUETIAPINE FUMARATE 50 MG PO TABS: 25.0000 mg | ORAL_TABLET | Freq: Every day | ORAL | 2 refills | Status: DC

## 2019-04-21 MED ORDER — HYDROXYZINE HCL 25 MG PO TABS: 12.5000 mg | ORAL_TABLET | Freq: Two times a day (BID) | ORAL | 1 refills | Status: AC | PRN

## 2019-04-21 MED ORDER — ESCITALOPRAM OXALATE 20 MG PO TABS: ORAL_TABLET | ORAL | 1 refills | Status: AC

## 2019-04-21 MED ORDER — BUSPIRONE HCL 15 MG PO TABS: 15.0000 mg | ORAL_TABLET | Freq: Two times a day (BID) | ORAL | 0 refills | Status: DC
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
## 2019-05-23 ENCOUNTER — Encounter: Payer: Self-pay | Admitting: Psychiatry

## 2019-05-23 ENCOUNTER — Other Ambulatory Visit: Payer: Self-pay

## 2019-05-23 ENCOUNTER — Telehealth (INDEPENDENT_AMBULATORY_CARE_PROVIDER_SITE_OTHER): Payer: Medicare PPO | Admitting: Psychiatry

## 2019-05-23 DIAGNOSIS — F4310 Post-traumatic stress disorder, unspecified: Secondary | ICD-10-CM

## 2019-05-23 DIAGNOSIS — F331 Major depressive disorder, recurrent, moderate: Secondary | ICD-10-CM

## 2019-05-23 DIAGNOSIS — F411 Generalized anxiety disorder: Secondary | ICD-10-CM

## 2019-05-23 NOTE — Progress Notes (Signed)
Provider Location : [facility] Patient Location : Home  Virtual Visit via Video Note  I connected with [name] on 05/23/19 at  9:00 AM EDT by a video enabled telemedicine application and verified that I am speaking with the correct person using two identifiers.   I discussed the limitations of evaluation and management by telemedicine and the availability of in person appointments. The patient expressed understanding and agreed to proceed.   I discussed the assessment and treatment plan with the patient. The patient was provided an opportunity to ask questions and all were answered. The patient agreed with the plan and demonstrated an understanding of the instructions.   The patient was advised to call back or seek an in-person evaluation if the symptoms worsen or if the condition fails to improve as anticipated.  Monte Rio MD OP Progress Note  05/23/2019 12:55 PM KIJUANA POITRA  MRN:  [id number]  Chief Complaint:  Chief Complaint    Follow-up     HPI: [name] is a 59 year old female, married, Caucasian, lives in [place], has a history of GAD, PTSD, MDD was evaluated by telemedicine today.  Patient today reports she is struggling with upper respiratory infection.  She is struggling with cough, sore throat, wheezing, fatigue, change in her appetite and so on since the past several days.  She is currently on prednisone, antibiotics and Tessalon Pearls.  She reports her symptoms are getting better.  She will continue to follow-up with her primary care provider.  Patient otherwise reports mood wise she is doing okay.  She denies any significant anxiety or depression.  She reports sleep is okay.  She is compliant on her medications and denies side effects.  Patient denies any suicidality, homicidality or perceptual disturbances.  Patient reports she has not reached out to Ms. Miguel Dibble yet however agrees to do so soon.  Patient denies any other concerns today. Visit Diagnosis:    ICD-10-CM    1. GAD (generalized anxiety disorder)  F41.1   2. Post traumatic stress disorder (PTSD)  F43.10   3. MDD (major depressive disorder), recurrent episode, moderate ([name])  F33.1     Past Psychiatric History: I have reviewed past psychiatric history from my progress note on 07/02/2018.  Past Medical History:  Past Medical History:  Diagnosis Date  . Anxiety   . Arthritis   . Asthma   . Complication of anesthesia   . Depression   . Diverticulitis   . Heart palpitations   . Liver disease   . Migraines   . PONV (postoperative nausea and vomiting)   . Sleep apnea     Past Surgical History:  Procedure Laterality Date  . ABDOMINAL HYSTERECTOMY    . APPENDECTOMY    . CHOLECYSTECTOMY    . COLONOSCOPY WITH PROPOFOL N/A 04/21/2015   Procedure: COLONOSCOPY WITH PROPOFOL;  Surgeon: Manya Silvas, MD;  Location: [facility] ENDOSCOPY;  Service: Endoscopy;  Laterality: N/A;  . COLONOSCOPY WITH PROPOFOL N/A 03/13/2019   Procedure: COLONOSCOPY WITH PROPOFOL;  Surgeon: Toledo, Benay Pike, MD;  Location: [facility] ENDOSCOPY;  Service: Gastroenterology;  Laterality: N/A;  . ESOPHAGOGASTRODUODENOSCOPY (EGD) WITH PROPOFOL N/A 03/13/2019   Procedure: ESOPHAGOGASTRODUODENOSCOPY (EGD) WITH PROPOFOL;  Surgeon: Toledo, Benay Pike, MD;  Location: [facility] ENDOSCOPY;  Service: Gastroenterology;  Laterality: N/A;  . SINUSOTOMY    . TONSILLECTOMY      Family Psychiatric History: I have reviewed family psychiatric history from my progress note on 07/02/2018. Family History:  Family History  Problem Relation Age of  Onset  . Diabetes Mother   . Muscular dystrophy Mother   . Heart failure Mother   . Anxiety disorder Mother   . Skin cancer Father   . Heart disease Father   . Hypertension Father   . Depression Father   . Alcohol abuse Father   . Drug abuse Father   . Heart disease Sister   . Diabetes Sister   . Anxiety disorder Sister   . Hypertension Brother   . Diverticulosis Brother   . Heart disease Sister   .  Diabetes Sister   . Heart disease Sister   . Diabetes Sister   . Breast cancer Paternal Aunt     Social History: I have reviewed social history from my progress note on 07/02/2018. Social History   Socioeconomic History  . Marital status: Married    Spouse name: Journalist, newspaper  . Number of children: 0  . Years of education: Not on file  . Highest education level: High school graduate  Occupational History    Comment: disabled  Tobacco Use  . Smoking status: Former Smoker    Types: Cigarettes    Quit date: 01/15/2017    Years since quitting: 2.3  . Smokeless tobacco: Never Used  Substance and Sexual Activity  . Alcohol use: No    Alcohol/week: 0.0 standard drinks  . Drug use: No  . Sexual activity: Yes    Birth control/protection: None  Other Topics Concern  . Not on file  Social History Narrative  . Not on file   Social Determinants of Health   Financial Resource Strain:   . Difficulty of Paying Living Expenses:   Food Insecurity:   . Worried About Charity fundraiser in the Last Year:   . Arboriculturist in the Last Year:   Transportation Needs:   . Film/video editor (Medical):   Marland Kitchen Lack of Transportation (Non-Medical):   Physical Activity:   . Days of Exercise per Week:   . Minutes of Exercise per Session:   Stress:   . Feeling of Stress :   Social Connections:   . Frequency of Communication with Friends and Family:   . Frequency of Social Gatherings with Friends and Family:   . Attends Religious Services:   . Active Member of Clubs or Organizations:   . Attends Archivist Meetings:   Marland Kitchen Marital Status:     Allergies:  Allergies  Allergen Reactions  . Cefotetan Rash  . Iodinated Diagnostic Agents Other (See Comments) and Hives    Uncoded Allergy. Allergen: MYCIN FAMILY Uncoded Allergy. Allergen: MULTIPLE FOODS, Other Reaction: Other reaction Uncoded Allergy. Allergen: MYCIN FAMILY Uncoded Allergy. Allergen: MULTIPLE FOODS, Other Reaction: Other  reaction Uncoded Allergy. Allergen: MYCIN FAMILY Uncoded Allergy. Allergen: MULTIPLE FOODS, Other Reaction: Other reaction Uncoded Allergy. Allergen: MYCIN FAMILY Uncoded Allergy. Allergen: MULTIPLE FOODS, Other Reaction: Other reaction  . Other Anaphylaxis    Allergic to seafood  . Promethazine Hives  . Promethazine Hcl Hives  . Propoxyphene Other (See Comments)  . Propoxyphene Other (See Comments)  . Sulfa Antibiotics Hives  . Penicillins Hives  . Saccharin Hives  . Aminoglycosides Hives  . Cefuroxime   . Eggs Or Egg-Derived Products   . Influenza Vaccines Swelling    Redness and swelling of site. Pain and fever.   . Latex   . Red Dye     Metabolic Disorder Labs: No results found for: HGBA1C, MPG No results found for: PROLACTIN No results found for:  CHOL, TRIG, HDL, CHOLHDL, VLDL, LDLCALC No results found for: TSH  Therapeutic Level Labs: No results found for: LITHIUM No results found for: VALPROATE No components found for:  CBMZ  Current Medications: Current Outpatient Medications  Medication Sig Dispense Refill  . benzonatate (TESSALON) 200 MG capsule Take by mouth.    . doxycycline (VIBRA-TABS) 100 MG tablet Take by mouth.    . predniSONE (DELTASONE) 10 MG tablet Take by mouth.    . ADVAIR HFA 230-21 MCG/ACT inhaler INHALE 2 PUFFS INTO THE LUNGS TWICE DAILY 12 g 5  . ADVAIR HFA 230-21 MCG/ACT inhaler     . albuterol (VENTOLIN HFA) 108 (90 Base) MCG/ACT inhaler INHALE 2 PUFFS INTO THE LUNGS EVERY 6 HOURS AS NEEDED FOR WHEEZING    . aspirin EC 81 MG tablet Take 81 mg by mouth daily.    . busPIRone (BUSPAR) 15 MG tablet Take 1 tablet (15 mg total) by mouth 2 (two) times daily. 180 tablet 0  . cetirizine (ZYRTEC ALLERGY) 10 MG tablet Take 20 mg by mouth daily.    Marland Kitchen DEXILANT 60 MG capsule Take 1 capsule by mouth daily.    Marland Kitchen doxycycline (VIBRAMYCIN) 100 MG capsule     . escitalopram (LEXAPRO) 20 MG tablet TAKE 1 TABLET(20 MG) BY MOUTH DAILY 90 tablet 1  .  fluorometholone (FML) 0.1 % ophthalmic suspension SMARTSIG:1 Drop(s) In Eye(s) Daily PRN    . hydrOXYzine (ATARAX/VISTARIL) 25 MG tablet Take 0.5-1 tablets (12.5-25 mg total) by mouth 2 (two) times daily as needed. Only for anxiety attacks 60 tablet 1  . metFORMIN (GLUCOPHAGE) 500 MG tablet     . metFORMIN (GLUCOPHAGE) 500 MG tablet Take by mouth.    . metoprolol succinate (TOPROL-XL) 25 MG 24 hr tablet Take 12.5 mg by mouth daily.   3  . metoprolol succinate (TOPROL-XL) 25 MG 24 hr tablet     . montelukast (SINGULAIR) 10 MG tablet Take 10 mg by mouth at bedtime.    . nitroGLYCERIN (NITROSTAT) 0.4 MG SL tablet Place 0.4 mg under the tongue every 5 (five) minutes as needed.     Marland Kitchen oxyCODONE (OXY IR/ROXICODONE) 5 MG immediate release tablet Take by mouth.    . QUEtiapine (SEROQUEL) 50 MG tablet Take 0.5 tablets (25 mg total) by mouth at bedtime. 45 tablet 2  . rosuvastatin (CRESTOR) 5 MG tablet Take 5 mg by mouth daily.     Marland Kitchen SINGULAIR 10 MG tablet     . topiramate (TOPAMAX) 200 MG tablet Take 200 mg by mouth daily.     Marland Kitchen topiramate (TOPAMAX) 200 MG tablet Take by mouth.    . Vitamin D, Ergocalciferol, (DRISDOL) 50000 units CAPS capsule TAKE 1 CAPSULE BY MOUTH 1 TIME A WEEK     No current facility-administered medications for this visit.     Musculoskeletal: Strength & Muscle Tone: UTA Gait & Station: normal Patient leans: N/A  Psychiatric Specialty Exam: Review of Systems  Constitutional: Positive for fatigue.  HENT: Positive for sneezing and sore throat.   Respiratory: Positive for cough.   Psychiatric/Behavioral: Negative for agitation, behavioral problems, confusion, decreased concentration, dysphoric mood, hallucinations, self-injury, sleep disturbance and suicidal ideas. The patient is not nervous/anxious and is not hyperactive.   All other systems reviewed and are negative.   There were no vitals taken for this visit.There is no height or weight on file to calculate BMI.   General Appearance: Casual  Eye Contact:  Fair  Speech:  Clear and Coherent  Volume:  Normal  Mood:  Euthymic  Affect:  Congruent  Thought Process:  Goal Directed and Descriptions of Associations: Intact  Orientation:  Full (Time, Place, and Person)  Thought Content: Logical   Suicidal Thoughts:  No  Homicidal Thoughts:  No  Memory:  Immediate;   Fair Recent;   Fair Remote;   Fair  Judgement:  Fair  Insight:  Fair  Psychomotor Activity:  Normal  Concentration:  Concentration: Fair and Attention Span: Fair  Recall:  [employer] of Knowledge: Fair  Language: Fair  Akathisia:  No  Handed:  Right  AIMS (if indicated): UTA  Assets:  Communication Skills Desire for Improvement Housing Social Support Talents/Skills Transportation  ADL's:  Intact  Cognition: WNL  Sleep:  Fair   Screenings:   Assessment and Plan: [name] is a 59 year old Caucasian female, married, lives in [place], has a history of PTSD, MDD, GAD was evaluated by telemedicine today.  Patient is biologically predisposed given her history of trauma.  Patient with psychosocial stressors of her husband's health issues, current pandemic and her own health problems.  Patient however is currently getting better on the current medication regimen for her anxiety and depression.  Plan as noted below.  Plan GAD-stable Lexapro 20 mg p.o. daily Hydroxyzine 12.5 to 25 mg p.o. twice daily as needed for anxiety attacks BuSpar 50 mg p.o. twice daily  PTSD-improving Seroquel 25 mg p.o. nightly Lexapro and BuSpar as prescribed Patient encouraged to continue CBT with Ms. Miguel Dibble.  MDD-improving Lexapro and Seroquel as prescribed  Patient will continue to follow-up with her primary care provider for her upper respiratory infection.  Follow-up in clinic in 2 months or sooner if needed.  I have spent atleast 20 minutes non face to face with patient today. More than 50 % of the time was spent for preparing to see the  patient ( e.g., review of test, records ), obtaining and to review and separately obtained history , ordering medications and test ,psychoeducation and supportive psychotherapy and care coordination,as well as documenting clinical information in electronic health record. This note was generated in part or whole with voice recognition software. Voice recognition is usually quite accurate but there are transcription errors that can and very often do occur. I apologize for any typographical errors that were not detected and corrected.       Ursula Alert, MD 05/23/2019, 12:55 PM

## 2019-07-25 ENCOUNTER — Telehealth (INDEPENDENT_AMBULATORY_CARE_PROVIDER_SITE_OTHER): Payer: Medicare PPO | Admitting: Psychiatry

## 2019-07-25 ENCOUNTER — Encounter: Payer: Self-pay | Admitting: Psychiatry

## 2019-07-25 ENCOUNTER — Other Ambulatory Visit: Payer: Self-pay

## 2019-07-25 DIAGNOSIS — F411 Generalized anxiety disorder: Secondary | ICD-10-CM

## 2019-07-25 DIAGNOSIS — F4310 Post-traumatic stress disorder, unspecified: Secondary | ICD-10-CM

## 2019-07-25 DIAGNOSIS — F331 Major depressive disorder, recurrent, moderate: Secondary | ICD-10-CM | POA: Diagnosis not present

## 2019-07-25 MED ORDER — QUETIAPINE FUMARATE 50 MG PO TABS: 25.0000 mg | ORAL_TABLET | Freq: Every day | ORAL | 2 refills | Status: AC

## 2019-07-25 MED ORDER — BUSPIRONE HCL 15 MG PO TABS: 15.0000 mg | ORAL_TABLET | Freq: Three times a day (TID) | ORAL | 0 refills | Status: AC
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
## 2019-09-30 ENCOUNTER — Telehealth: Payer: Medicare PPO | Admitting: Psychiatry

## 2019-09-30 ENCOUNTER — Other Ambulatory Visit: Payer: Self-pay

## 2019-10-17 DEATH — deceased

## 2020-03-23 IMAGING — CR DG CHEST 2V
2 series · 2 of 2 positions shown · non-contrast
Comparison: 01/07/2014

CLINICAL DATA: Shortness of breath, history of recent pneumonia few
months ago

EXAM:
CHEST - 2 VIEW

[chest pa]
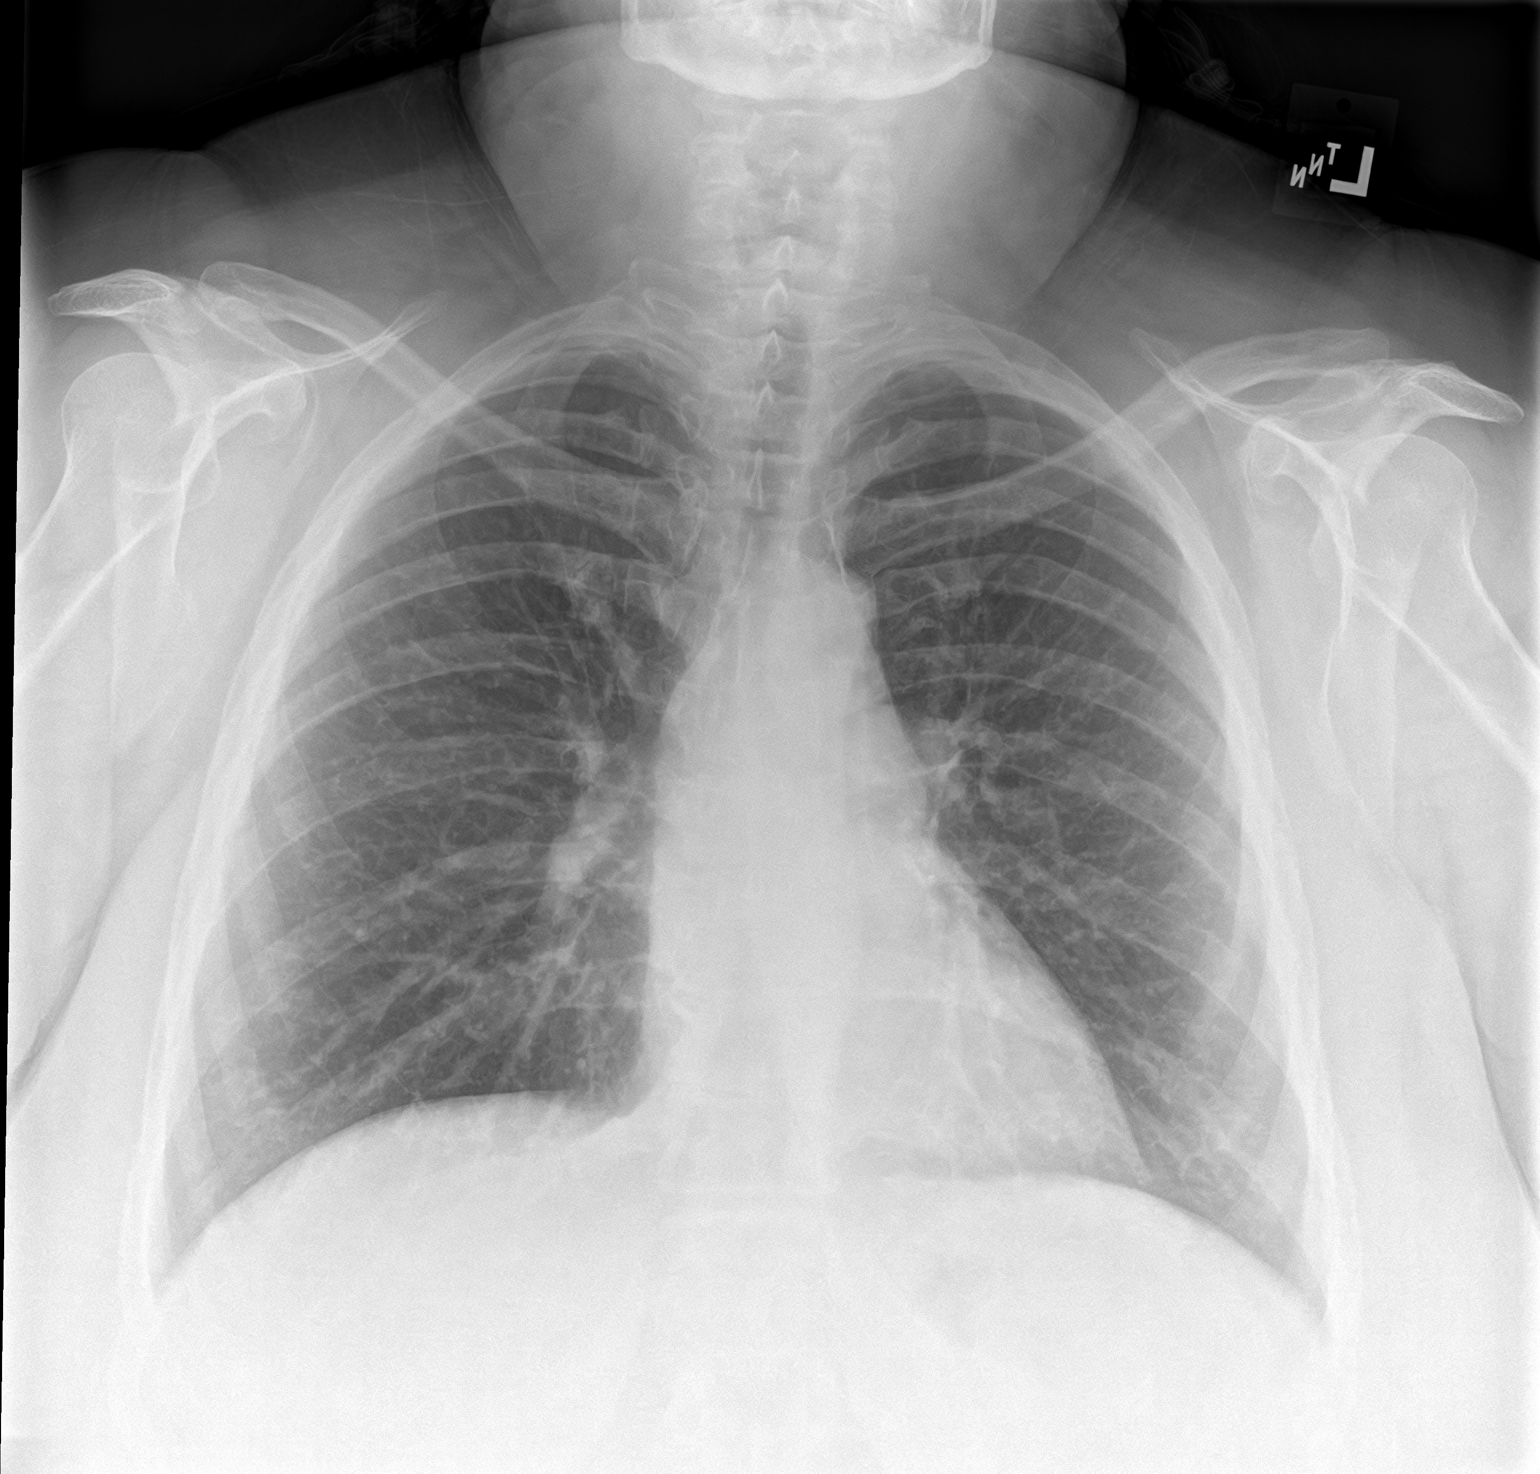

[chest lat]
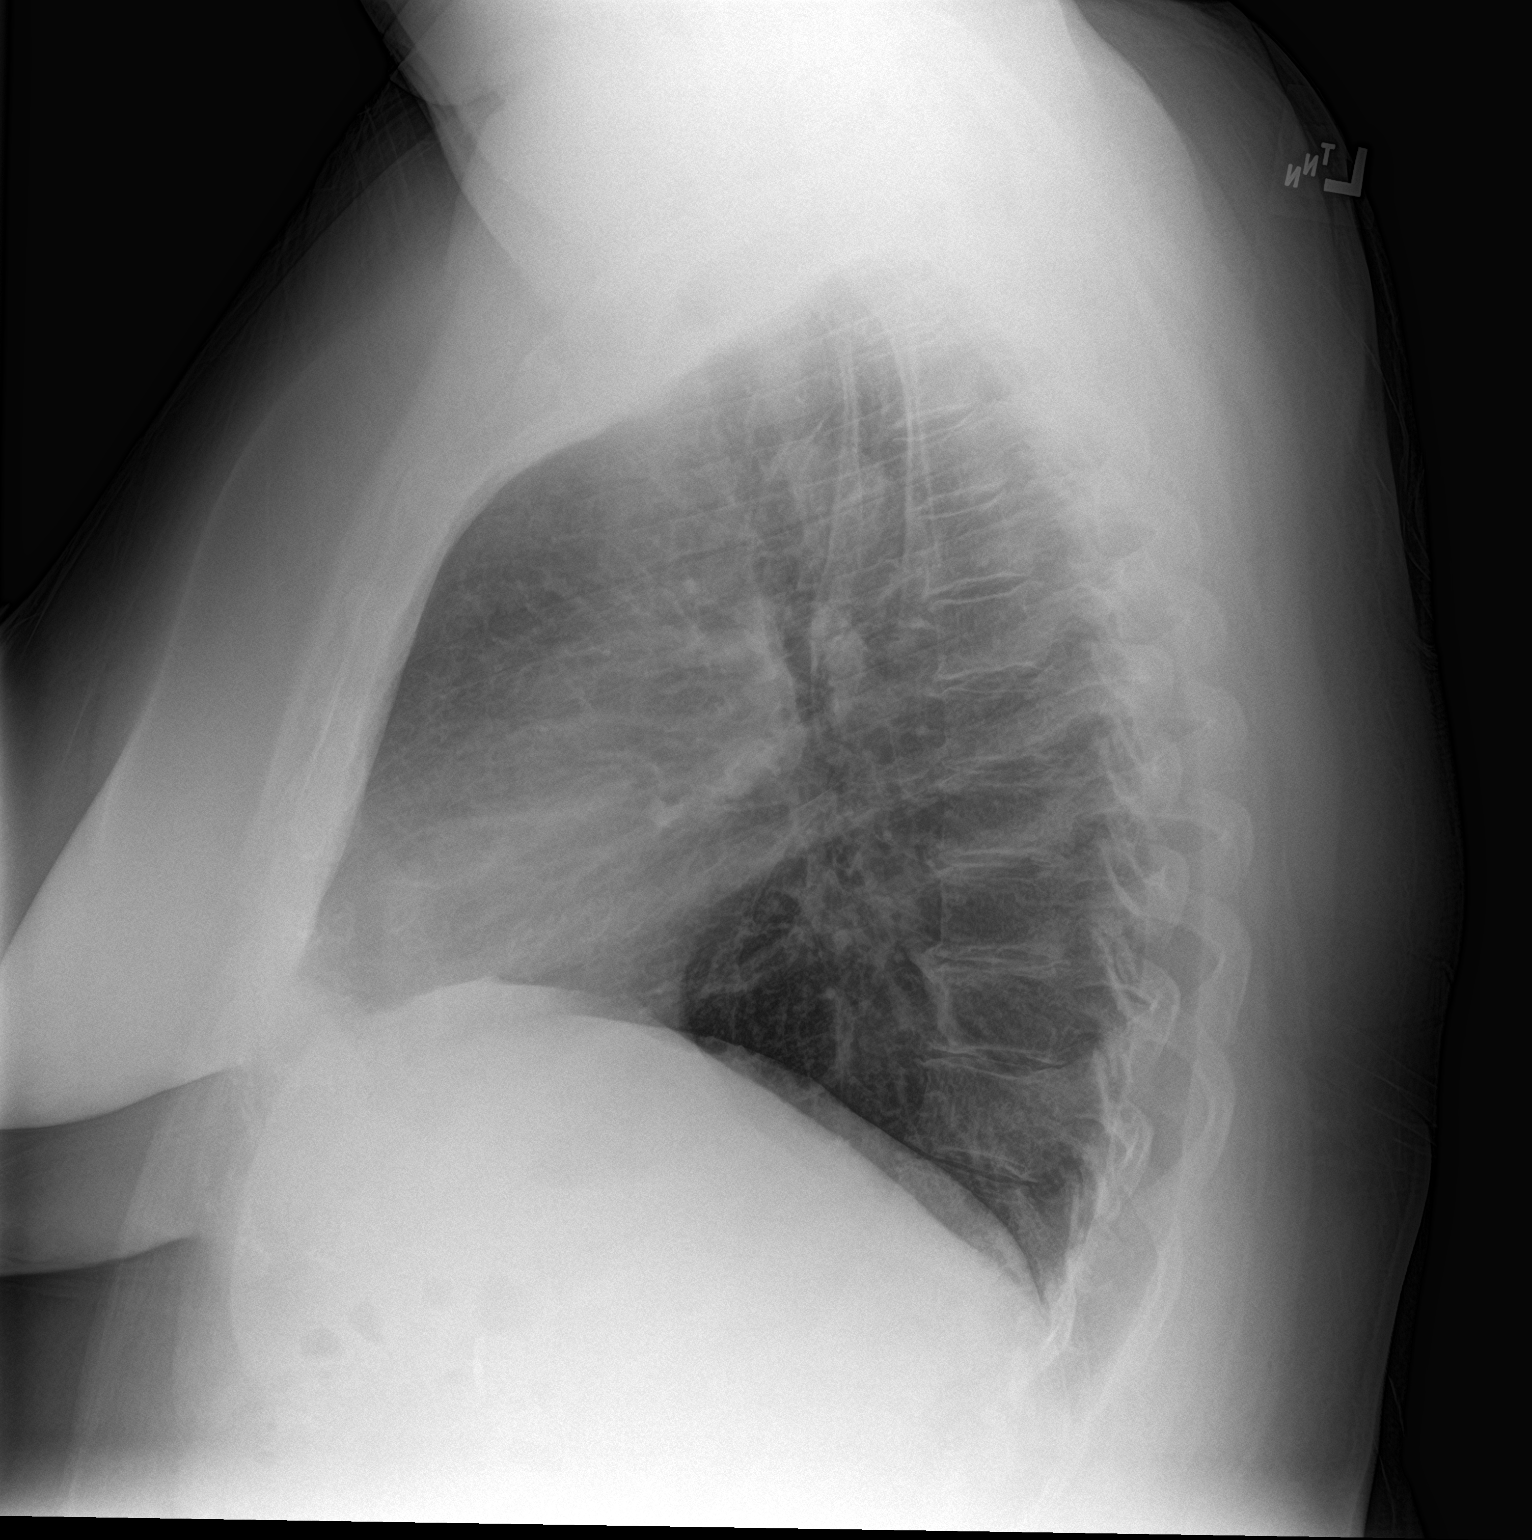

[2 of 2 positions shown; findings below may reference images not displayed]

FINDINGS: The heart size and mediastinal contours are within normal limits.
Both lungs are clear. The visualized skeletal structures are
unremarkable.
IMPRESSION: No active cardiopulmonary disease.
